# Patient Record
Sex: Female | Born: 1935 | Race: White | Hispanic: No | Smoking: Never smoker
Health system: Southern US, Community
[De-identification: ages and names within clinical notes are randomized; demographics above are authoritative.]

## PROBLEM LIST (undated history)

## (undated) DIAGNOSIS — J309 Allergic rhinitis, unspecified: Secondary | ICD-10-CM

## (undated) DIAGNOSIS — R198 Other specified symptoms and signs involving the digestive system and abdomen: Secondary | ICD-10-CM

## (undated) DIAGNOSIS — N390 Urinary tract infection, site not specified: Secondary | ICD-10-CM

## (undated) DIAGNOSIS — N183 Chronic kidney disease, stage 3 unspecified: Secondary | ICD-10-CM

## (undated) DIAGNOSIS — D649 Anemia, unspecified: Secondary | ICD-10-CM

## (undated) DIAGNOSIS — E611 Iron deficiency: Secondary | ICD-10-CM

## (undated) DIAGNOSIS — K56609 Unspecified intestinal obstruction, unspecified as to partial versus complete obstruction: Secondary | ICD-10-CM

## (undated) DIAGNOSIS — K5790 Diverticulosis of intestine, part unspecified, without perforation or abscess without bleeding: Secondary | ICD-10-CM

## (undated) DIAGNOSIS — R195 Other fecal abnormalities: Secondary | ICD-10-CM

## (undated) DIAGNOSIS — E860 Dehydration: Secondary | ICD-10-CM

## (undated) DIAGNOSIS — I10 Essential (primary) hypertension: Secondary | ICD-10-CM

## (undated) DIAGNOSIS — E559 Vitamin D deficiency, unspecified: Secondary | ICD-10-CM

## (undated) DIAGNOSIS — E538 Deficiency of other specified B group vitamins: Secondary | ICD-10-CM

## (undated) DIAGNOSIS — M7120 Synovial cyst of popliteal space [Baker], unspecified knee: Secondary | ICD-10-CM

## (undated) DIAGNOSIS — Z932 Ileostomy status: Secondary | ICD-10-CM

## (undated) DIAGNOSIS — G729 Myopathy, unspecified: Secondary | ICD-10-CM

## (undated) DIAGNOSIS — E782 Mixed hyperlipidemia: Secondary | ICD-10-CM

## (undated) DIAGNOSIS — M4180 Other forms of scoliosis, site unspecified: Secondary | ICD-10-CM

## (undated) DIAGNOSIS — E119 Type 2 diabetes mellitus without complications: Secondary | ICD-10-CM

## (undated) DIAGNOSIS — K5792 Diverticulitis of intestine, part unspecified, without perforation or abscess without bleeding: Secondary | ICD-10-CM

## (undated) DIAGNOSIS — M109 Gout, unspecified: Secondary | ICD-10-CM

## (undated) DIAGNOSIS — C679 Malignant neoplasm of bladder, unspecified: Secondary | ICD-10-CM

## (undated) DIAGNOSIS — G629 Polyneuropathy, unspecified: Secondary | ICD-10-CM

## (undated) DIAGNOSIS — M2550 Pain in unspecified joint: Secondary | ICD-10-CM

## (undated) DIAGNOSIS — B351 Tinea unguium: Secondary | ICD-10-CM

## (undated) HISTORY — DX: Iron deficiency: E61.1

## (undated) HISTORY — DX: Type 2 diabetes mellitus without complications: E11.9

## (undated) HISTORY — DX: Dehydration: E86.0

## (undated) HISTORY — DX: Deficiency of other specified B group vitamins: E53.8

## (undated) HISTORY — DX: Hypomagnesemia: E83.42

## (undated) HISTORY — DX: Other specified symptoms and signs involving the digestive system and abdomen: R19.8

## (undated) HISTORY — DX: Chronic kidney disease, stage 3 (moderate): N18.3

## (undated) HISTORY — DX: Tinea unguium: B35.1

## (undated) HISTORY — PX: CATARACT EXTRACTION, BILATERAL: SHX1313

## (undated) HISTORY — PX: BLADDER SUSPENSION: SHX72

## (undated) HISTORY — DX: Essential (primary) hypertension: I10

## (undated) HISTORY — DX: Allergic rhinitis, unspecified: J30.9

## (undated) HISTORY — DX: Ileostomy status: Z93.2

## (undated) HISTORY — DX: Other forms of scoliosis, site unspecified: M41.80

## (undated) HISTORY — DX: Urinary tract infection, site not specified: N39.0

## (undated) HISTORY — DX: Polyneuropathy, unspecified: G62.9

## (undated) HISTORY — DX: Vitamin D deficiency, unspecified: E55.9

## (undated) HISTORY — DX: Chronic kidney disease, stage 3 unspecified: N18.30

## (undated) HISTORY — DX: Unspecified intestinal obstruction, unspecified as to partial versus complete obstruction: K56.609

## (undated) HISTORY — DX: Diverticulosis of intestine, part unspecified, without perforation or abscess without bleeding: K57.90

## (undated) HISTORY — DX: Anemia, unspecified: D64.9

## (undated) HISTORY — DX: Other fecal abnormalities: R19.5

## (undated) HISTORY — DX: Gout, unspecified: M10.9

## (undated) HISTORY — DX: Malignant neoplasm of bladder, unspecified: C67.9

## (undated) HISTORY — DX: Hypocalcemia: E83.51

## (undated) HISTORY — PX: TRANSURETHRAL RESECTION OF BLADDER TUMOR: SHX2575

## (undated) HISTORY — DX: Myopathy, unspecified: G72.9

## (undated) HISTORY — DX: Diverticulitis of intestine, part unspecified, without perforation or abscess without bleeding: K57.92

## (undated) HISTORY — DX: Synovial cyst of popliteal space (Baker), unspecified knee: M71.20

## (undated) HISTORY — DX: Mixed hyperlipidemia: E78.2

## (undated) HISTORY — DX: Pain in unspecified joint: M25.50

---

## 1964-03-09 HISTORY — PX: OTHER SURGICAL HISTORY: SHX169

## 1976-03-09 HISTORY — PX: CHOLECYSTECTOMY: SHX55

## 1996-03-09 HISTORY — PX: MASTECTOMY: SHX3

## 2009-03-09 HISTORY — PX: MASTECTOMY: SHX3

## 2015-03-10 HISTORY — PX: ILEOSTOMY: SHX1783

## 2017-01-07 LAB — HM DIABETES EYE EXAM

## 2017-03-09 HISTORY — PX: KYPHOPLASTY: SHX5884

## 2017-09-23 LAB — BASIC METABOLIC PANEL
BUN: 49 — AB (ref 4–21)
Creatinine: 2.4 — AB (ref 0.5–1.1)
Glucose: 147
SODIUM: 138 (ref 137–147)

## 2017-09-23 LAB — HEPATIC FUNCTION PANEL
ALT: 10 (ref 7–35)
AST: 12 — AB (ref 13–35)
Alkaline Phosphatase: 87 (ref 25–125)
BILIRUBIN, TOTAL: 0.5

## 2017-09-23 LAB — CBC AND DIFFERENTIAL
HEMATOCRIT: 33 — AB (ref 36–46)
HEMOGLOBIN: 11 — AB (ref 12.0–16.0)
Platelets: 262 (ref 150–399)
WBC: 6.8

## 2017-12-13 ENCOUNTER — Ambulatory Visit (INDEPENDENT_AMBULATORY_CARE_PROVIDER_SITE_OTHER): Payer: Medicare Other | Admitting: Internal Medicine

## 2017-12-13 ENCOUNTER — Encounter: Payer: Self-pay | Admitting: Internal Medicine

## 2017-12-13 VITALS — BP 110/60 | HR 100 | Temp 97.6°F | Ht 58.5 in | Wt 116.0 lb

## 2017-12-13 DIAGNOSIS — N1832 Chronic kidney disease, stage 3b: Secondary | ICD-10-CM | POA: Insufficient documentation

## 2017-12-13 DIAGNOSIS — E1122 Type 2 diabetes mellitus with diabetic chronic kidney disease: Secondary | ICD-10-CM | POA: Diagnosis not present

## 2017-12-13 DIAGNOSIS — Z932 Ileostomy status: Secondary | ICD-10-CM | POA: Insufficient documentation

## 2017-12-13 DIAGNOSIS — D508 Other iron deficiency anemias: Secondary | ICD-10-CM

## 2017-12-13 DIAGNOSIS — K5790 Diverticulosis of intestine, part unspecified, without perforation or abscess without bleeding: Secondary | ICD-10-CM

## 2017-12-13 DIAGNOSIS — M810 Age-related osteoporosis without current pathological fracture: Secondary | ICD-10-CM | POA: Insufficient documentation

## 2017-12-13 DIAGNOSIS — N183 Chronic kidney disease, stage 3 unspecified: Secondary | ICD-10-CM

## 2017-12-13 DIAGNOSIS — Z23 Encounter for immunization: Secondary | ICD-10-CM | POA: Diagnosis not present

## 2017-12-13 DIAGNOSIS — R195 Other fecal abnormalities: Secondary | ICD-10-CM | POA: Diagnosis not present

## 2017-12-13 DIAGNOSIS — R000 Tachycardia, unspecified: Secondary | ICD-10-CM | POA: Insufficient documentation

## 2017-12-13 DIAGNOSIS — R600 Localized edema: Secondary | ICD-10-CM

## 2017-12-13 DIAGNOSIS — Z853 Personal history of malignant neoplasm of breast: Secondary | ICD-10-CM | POA: Insufficient documentation

## 2017-12-13 DIAGNOSIS — D649 Anemia, unspecified: Secondary | ICD-10-CM | POA: Insufficient documentation

## 2017-12-13 DIAGNOSIS — Z87440 Personal history of urinary (tract) infections: Secondary | ICD-10-CM | POA: Insufficient documentation

## 2017-12-13 DIAGNOSIS — H6121 Impacted cerumen, right ear: Secondary | ICD-10-CM | POA: Diagnosis not present

## 2017-12-13 DIAGNOSIS — H6123 Impacted cerumen, bilateral: Secondary | ICD-10-CM | POA: Insufficient documentation

## 2017-12-13 DIAGNOSIS — Z8551 Personal history of malignant neoplasm of bladder: Secondary | ICD-10-CM | POA: Insufficient documentation

## 2017-12-13 MED ORDER — DIPHENOXYLATE-ATROPINE 2.5-0.025 MG PO TABS: 1.0000 | ORAL_TABLET | Freq: Three times a day (TID) | ORAL | 0 refills | Status: DC | PRN

## 2017-12-13 MED ORDER — MAGNESIUM OXIDE 400 MG PO TABS: 400.0000 mg | ORAL_TABLET | Freq: Every day | ORAL | 1 refills | Status: DC

## 2017-12-13 MED ORDER — ALENDRONATE SODIUM 70 MG PO TABS: 70.0000 mg | ORAL_TABLET | ORAL | 5 refills | Status: DC

## 2017-12-13 MED ORDER — PANTOPRAZOLE SODIUM 40 MG PO TBEC: 40.0000 mg | DELAYED_RELEASE_TABLET | Freq: Every day | ORAL | 1 refills | Status: DC

## 2017-12-13 MED ORDER — SODIUM BICARBONATE 650 MG PO TABS: 650.0000 mg | ORAL_TABLET | Freq: Two times a day (BID) | ORAL | 1 refills | Status: DC

## 2017-12-13 MED ORDER — LOPERAMIDE HCL 2 MG PO CAPS: 2.0000 mg | ORAL_CAPSULE | Freq: Two times a day (BID) | ORAL | 1 refills | Status: DC

## 2017-12-13 MED ORDER — VITAMIN C 500 MG PO TABS: 500.0000 mg | ORAL_TABLET | Freq: Every day | ORAL | 1 refills | Status: DC

## 2017-12-13 MED ORDER — PIOGLITAZONE HCL 15 MG PO TABS: 15.0000 mg | ORAL_TABLET | Freq: Every day | ORAL | 5 refills | Status: DC

## 2017-12-13 MED ORDER — DIPHENOXYLATE-ATROPINE 2.5-0.025 MG PO TABS: 1.0000 | ORAL_TABLET | Freq: Three times a day (TID) | ORAL | 1 refills | Status: DC | PRN

## 2017-12-13 MED ORDER — ZOLPIDEM TARTRATE 5 MG PO TABS: 5.0000 mg | ORAL_TABLET | Freq: Every evening | ORAL | 0 refills | Status: DC | PRN

## 2017-12-13 MED ORDER — VITAMIN D3 50 MCG (2000 UT) PO CAPS: 2000.0000 [IU] | ORAL_CAPSULE | Freq: Every day | ORAL | 5 refills | Status: DC

## 2017-12-13 MED ORDER — TRAMADOL HCL 50 MG PO TABS: 50.0000 mg | ORAL_TABLET | Freq: Four times a day (QID) | ORAL | 0 refills | Status: DC | PRN

## 2017-12-13 NOTE — Progress Notes (Signed)
Provider:  Rexene Edison. Mariea Clonts, D.O., C.M.D. Location:   [place] of Service:   clinic  Previous PCP: System, Pcp Not In Patient Care Team: System, Pcp Not In as PCP - [facility] in [place] Extended Emergency Contact Information Primary Emergency Contact: Swier, Jude Mobile Phone: [phone] Relation: Son  Code Status: DNR Goals of Care: Advanced Directive information Advanced Directives 12/13/2017  Does Patient Have a Medical Advance Directive? Yes  Type of Paramedic of [place];Living will;Out of facility DNR (pink MOST or yellow form)  Does patient want to make changes to medical advance directive? No - Patient declined  Copy of [place] in Chart? No - copy requested   Chief Complaint  Patient presents with  . Establish Care    new patient, discuss lab results from [facility]    HPI: Patient is a 84 y.o. female seen today to establish with [facility].  Records have been requested from   Feet were swollen, she had pains in her legs that she never has. She'd been walking with PT with [facility] before that and had not been walking for a long time.   Ankles remain swollen.  She came out of rehab with a cold. That's better.    Gets hoarse every now and again.  No findings except except allergies.  She gets dehydrated and hoarse.  She does not drink water like she should.   Likes watery fruits and mlik also.    She fell w/o warning and broke her pelvis in this past year.  Had left ramus first time and right this time.  Back to walking, but now with cane.  Prior to that she did not use any assistive devices.  With her last hospitalization, she had kyphoplasty for a compression fx.  Pain went away immediately afterwards. Had pain for 6-12 mos that she did not tell anyone about. Had severe indigestion afterwards and came off iron and D.   Has always had "pre-anemia" and used to take iron since she had  her children.    She had been living at home alone before and now lives with her son.  Had been doing her own cooking and cleaning.  Did have help with her yard.  Still as driving also.    HR up today.  BP ok.  She was on metoprolol for tachy during the hospitalizations.  Dr. Loni Muse took her off b/c of low bp.  BP stays 117/72.  No dizziness or lightheadedness.  She has no heart history.    DMII:  Actos.  Had lantus at the hospital. Does not take any at home.  None in 6 mos.  CBGs:  103 this morning.  She checks her own sugars.  Has h/o diverticulosis with perforation.  Wound up with ileostomy in 2017.  She recovered well from the emergency surgery.    Hysterectomy was partial.  Breast cancer 1998, had mastectomy.  Three nodes were positive so she had chemo Then recurred in opposite breast and had mastectomy. Bladder cancer history  Some hearing loss since her fall.    No difficulty seeing.  Has yearly eye exams.  Had cataract surgery bilaterally.  Bowels had been normal with lomotil once a day and imodium twice a day.  After rehab, she's had watery diarrhea.  Had small cold, but has continued with looser stools through ileostomy.  lomotil bid and imodium once and metamucil when needs bulking. Dislikes it.  It makes her bloated.  It affects her appetite.    Not having much pain. Once in a while, the back pain. No pelvic pain.    She has no symptoms of UTI. May get a little short of breath and fatigued.  Back starts hurting/flank region. They are wanting her tested every 3 mos.    Had reclast back between [age] yo.    Got flu shot today.  Past Medical History:  Diagnosis Date  . Allergic rhinitis    Per Records from [facility]   . Anemia    Per Records from [place]   . Arthralgia of multiple sites    Per Records from [place]   . B12 deficiency    Per Records from [place]   . Baker's cyst    Per Records from [place]   . Bladder cancer ([place])   . CKD (chronic  kidney disease) stage 3, GFR 30-59 ml/min (HCC)    Per Records from [place]   . Dehydration    Per Records from [place]   . Dextroscoliosis    Per Records from [place]   . Diabetes ([place])   . Diverticulitis    Per Records from [place]   . Diverticulosis   . Fecal occult blood test positive    Per Records from [place]   . Gout    left foot  . 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 Hypertension    Per Records from [place]   . Hypocalcemia    Per Records from [place]   . Hypomagnesemia    Per Records from [place]   . Iron deficiency    Per Records from [place]   . Large bowel obstruction [facility])    Per Records from [place]   . Mixed dyslipidemia    Per Records from [place]   . Myopathy    Per Records from [place]   . Onychomycosis    Per Records from [place]   . Peripheral neuropathy    Per Records from [place]   . Recurrent UTI   . Vitamin D deficiency    Per Records from [place]    Past Surgical History:  Procedure Laterality Date  . BLADDER SUSPENSION     Per Records from [place]   . CATARACT EXTRACTION, BILATERAL    . CHOLECYSTECTOMY  1978  . hysterectomy  1966  . ILEOSTOMY  2017  . KYPHOPLASTY  2019  . MASTECTOMY Right 1998  . MASTECTOMY Left 2011  . TRANSURETHRAL RESECTION OF BLADDER TUMOR      Social History   Socioeconomic History  . Marital status: Widowed    Spouse name: Not on file  . Number of children: Not on file  . Years of education: Not on file  . Highest education level: Not on file  Occupational History  . Not on file  Social Needs  . Financial resource strain: Not on file  . Food insecurity:    Worry: Not on file    Inability: Not on file  . Transportation needs:    Medical: Not on file    Non-medical: Not on file  Tobacco Use  . Smoking status: Never Smoker  . Smokeless tobacco: Never Used  Substance and Sexual Activity  .  Alcohol use: Never    Frequency: Never  . Drug use: Never  . Sexual activity: Not Currently  Lifestyle  . Physical activity:    Days per week: Not on file    Minutes  per session: Not on file  . Stress: Not on file  Relationships  . Social connections:    Talks on phone: Not on file    Gets together: Not on file    Attends religious service: Not on file    Active member of club or organization: Not on file    Attends meetings of clubs or organizations: Not on file    Relationship status: Not on file  Other Topics Concern  . Not on file  Social History Narrative   Social History      Diet? regular      Do you drink/eat things with caffeine? 0      Marital status?          widow                          What year were you married? 1947      Do you live in a house, apartment, assisted living, condo, trailer, etc.? House--now lives in guest house of son, Jude      Is it one or more stories? yes      How many persons live in your home? 1 (self)      Do you have any pets in your home? (please list) 0      Highest level of education completed? 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       NO                               Type & how often?      Advanced Directives      Do you have a living will? yes      Do you have a DNR form?                   no               If not, do you want to discuss one? yes      Do you have signed POA/HPOA for forms? yes      Functional Status      Do you have difficulty bathing or dressing yourself? Yes- only due to repeat falls      Do you have difficulty preparing food or eating? no      Do you have difficulty managing your medications? no      Do you have difficulty managing your finances? no      Do you have difficulty affording your medications? no    reports that she has never smoked. She has never used smokeless tobacco. She reports that she does not drink alcohol or use drugs.  Functional  Status Survey:  INDEPENDENT except uses cane now that she often leaves behind  Family History  Problem Relation Age of Onset  . Transient ischemic attack Mother 26  . Heart attack Father 64  . Pancreatic cancer Sister 26  . Epilepsy Brother 75  . Diabetes Brother 109  . Dementia Sister 98  . Diabetes Brother   . Stroke Brother 28  . Alcoholism Brother 65  . Cancer - Other Son 52       liver cancer; [place]  . Heart attack Son 106  . Diabetes Son   . Anuerysm Daughter 69  brain    Health Maintenance  Topic Date Due  . HEMOGLOBIN A1C  02[birth year]/1926  . FOOT EXAM  03/04/1936  . OPHTHALMOLOGY EXAM  03/04/1936  . URINE MICROALBUMIN  03/04/1936  . TETANUS/TDAP  03/04/1945  . DEXA SCAN  03/05/1991  . PNA vac Low Risk Adult (1 of 2 - PCV13) 03/05/1991  . INFLUENZA VACCINE  10/07/2017  need up to date vaccine records from prior doctor Dr. Truett Mainland  Allergies  Allergen Reactions  . Codeine Nausea Only  . Flexeril [Cyclobenzaprine]     Per Records from [place]     Outpatient Encounter Medications as of 12/13/2017  Medication Sig  . diphenoxylate-atropine (LOMOTIL) 2.5-0.025 MG tablet Take 1 tablet by mouth 3 (three) times daily as needed.  . furosemide (LASIX) 20 MG tablet Take 20 mg by mouth as needed (fluid).  Marland Kitchen loperamide (IMODIUM) 2 MG capsule Take 1 capsule (2 mg total) by mouth 2 (two) times daily.  . magnesium oxide (MAG-OX) 400 MG tablet Take 1 tablet (400 mg total) by mouth daily.  . ondansetron (ZOFRAN-ODT) 4 MG disintegrating tablet Take 4 mg by mouth every 8 (eight) hours as needed for nausea or vomiting.  . pantoprazole (PROTONIX) 40 MG tablet Take 1 tablet (40 mg total) by mouth daily.  . pioglitazone (ACTOS) 15 MG tablet Take 1 tablet (15 mg total) by mouth daily.  . sodium bicarbonate 650 MG tablet Take 1 tablet (650 mg total) by mouth 2 (two) times daily.  . traMADol (ULTRAM) 50 MG tablet Take 1 tablet (50 mg total) by mouth every 6 (six) hours as needed.    . vitamin C (ASCORBIC ACID) 500 MG tablet Take 1 tablet (500 mg total) by mouth daily.  Marland Kitchen zolpidem (AMBIEN) 5 MG tablet Take 1 tablet (5 mg total) by mouth at bedtime as needed for sleep.  . [DISCONTINUED] aspirin EC 81 MG tablet Take 81 mg by mouth daily.  . [DISCONTINUED] diphenoxylate-atropine (LOMOTIL) 2.5-0.025 MG tablet Take 1 tablet by mouth 3 (three) times daily as needed.  . [DISCONTINUED] diphenoxylate-atropine (LOMOTIL) 2.5-0.025 MG tablet Take 1 tablet by mouth 3 (three) times daily as needed.  . [DISCONTINUED] insulin glargine (LANTUS) 100 UNIT/ML injection Inject into the skin daily.  . [DISCONTINUED] loperamide (IMODIUM) 2 MG capsule Take 2 mg by mouth 2 (two) times daily.  . [DISCONTINUED] magnesium oxide (MAG-OX) 400 MG tablet Take 400 mg by mouth daily.  . [DISCONTINUED] pantoprazole (PROTONIX) 40 MG tablet Take 40 mg by mouth daily.  . [DISCONTINUED] pioglitazone (ACTOS) 15 MG tablet Take 15 mg by mouth daily.  . [DISCONTINUED] sodium bicarbonate 650 MG tablet Take 650 mg by mouth 2 (two) times daily.  . [DISCONTINUED] traMADol (ULTRAM) 50 MG tablet Take 50 mg by mouth every 6 (six) hours as needed.  . [DISCONTINUED] vitamin C (ASCORBIC ACID) 500 MG tablet Take 500 mg by mouth daily.  . [DISCONTINUED] zolpidem (AMBIEN) 5 MG tablet Take 5 mg by mouth at bedtime as needed for sleep.  Marland Kitchen alendronate (FOSAMAX) 70 MG tablet Take 1 tablet (70 mg total) by mouth once a week. Take with a full glass of water on an empty stomach.  . Cholecalciferol (VITAMIN D3) 2000 units capsule Take 1 capsule (2,000 Units total) by mouth daily.   No facility-administered encounter medications on file as of 12/13/2017.     Review of Systems  Constitutional: Positive for weight loss. Negative for chills, diaphoresis, fever and malaise/fatigue.       Has lost 12-15 lbs  since last pelvic fx and rehab stay  HENT: Positive for hearing loss. Negative for congestion and sore throat.        Right ear since  second fall with pelvic fx; gets some hoarseness   Eyes: Negative for blurred vision.  Respiratory: Negative for cough, sputum production, shortness of breath and wheezing.   Cardiovascular: Positive for leg swelling. Negative for chest pain, palpitations, orthopnea and PND.       Tachycardia off metoprolol (bp had been too low); ankles also more swollen in past few days after walking more with PT and being off metoprolol  Gastrointestinal: Positive for diarrhea. Negative for abdominal pain, blood in stool, constipation and melena.       Had diverticulitis and required ileostomy after peritonitis; gets loose bms since then; appetite poor while using metamucil--feels bloated all the time and not hungry  Genitourinary: Negative for dysuria, frequency and urgency.  Musculoskeletal: Positive for back pain, falls and myalgias.  Skin: Negative for itching and rash.  Neurological: Negative for dizziness, tingling, tremors, sensory change, speech change, loss of consciousness and weakness.  Endo/Heme/Allergies: Positive for environmental allergies. Does not bruise/bleed easily.  Psychiatric/Behavioral: Negative for depression and memory loss. The patient has insomnia. The patient is not nervous/anxious.     Vitals:   12/13/17 1322  BP: 110/60  Pulse: 100  Temp: 97.6 F (36.4 C)  TempSrc: Oral  SpO2: 96%  Weight: 116 lb (52.6 kg)  Height: 4' 10.5" (1.486 m)   Body mass index is 23.83 kg/m. Physical Exam  Constitutional: She is oriented to person, place, and time.  Thin female, no acute distress  HENT:  Head: Normocephalic and atraumatic.  Right Ear: External ear normal.  Left Ear: External ear normal.  Nose: Nose normal.  Mouth/Throat: Oropharynx is clear and moist.  Right auditory canal full of dark, hard cerumen; mild hoarseness  Eyes: Pupils are equal, round, and reactive to light. Conjunctivae and EOM are normal.  Neck: Normal range of motion. Neck supple. No JVD present. No  tracheal deviation present. No thyromegaly present.  Cardiovascular: Normal rate, regular rhythm, normal heart sounds and intact distal pulses.  Rate improved by time of exam  Pulmonary/Chest: Effort normal and breath sounds normal. No respiratory distress. She has no wheezes. She has no rales.  Abdominal: Soft. Bowel sounds are normal.  Ileostomy with pink tissue, soft yellow-brown stool that's lumpy appearing coming out  Musculoskeletal: Normal range of motion.  Ambulates with cane when she remembers to use it, no tenderness or pain with ROM of hips or knees  Neurological: She is alert and oriented to person, place, and time. She displays normal reflexes. No cranial nerve deficit. Coordination normal.  Skin: Skin is warm and dry. Capillary refill takes less than 2 seconds.  Psychiatric: She has a normal mood and affect. Her behavior is normal. Judgment and thought content normal.    Labs reviewed: Basic Metabolic Panel: Recent Labs    09/23/17  NA 138  BUN 49*  CREATININE 2.4*   Liver Function Tests: Recent Labs    09/23/17  AST 12*  ALT 10  ALKPHOS 87   No results for input(s): LIPASE, AMYLASE in the last 8760 hours. No results for input(s): AMMONIA in the last 8760 hours. CBC: Recent Labs    09/23/17  WBC 6.8  HGB 11.0*  HCT 33*  PLT 262   Assessment/Plan 1. Type 2 diabetes mellitus with stage 3 chronic kidney disease, without long-term current use of insulin (HCC) -  well controlled, not needing insulin, oddly was on lantus prn sliding scale with her actos - COMPLETE METABOLIC PANEL WITH GFR - Hemoglobin A1c - pioglitazone (ACTOS) 15 MG tablet; Take 1 tablet (15 mg total) by mouth daily.  Dispense: 30 tablet; Refill: 5 -? Any role of actos with her swelling  2. Hearing loss due to cerumen impaction, right -right ear was flushed with warm water and peroxide solution and large piece of dark cerumen removed, hearing improved  3. Loose stools -chronic since  ileostomy -worse lately so will up lomotil temporarily to tid for a couple of weeks, then go back to bid, then daily if possible, maintain fiber and hydration in diet -try stopping metamucil and upping the lomotil to prevent bloating and full feeling so she can drink and eat more  4. Ileostomy status ([place]) -due to diverticulitis surgery and concern for malignancy at that time which turned out to be negative  5. Diverticulosis -s/p bowel resection and ileostomy, cont high fiber diet  6. Senile osteoporosis - pt with h/o two pelvic fxs and a compression fx s/p kyphoplasty -get back on vitamin D3 daily and fosamax weekly  - Cholecalciferol (VITAMIN D3) 2000 units capsule; Take 1 capsule (2,000 Units total) by mouth daily.  Dispense: 30 capsule; Refill: 5 - alendronate (FOSAMAX) 70 MG tablet; Take 1 tablet (70 mg total) by mouth once a week. Take with a full glass of water on an empty stomach.  Dispense: 4 tablet; Refill: 5  7. Chronic kidney disease, stage 3, mod decreased GFR (HCC) -Avoid nephrotoxic agents like nsaids, dose adjust renally excreted meds, hydrate. - COMPLETE METABOLIC PANEL WITH GFR  8. Other iron deficiency anemia - was on iron at one time, but got stopped when she was struggling to eat enough and taking a lot of other pills-reheck labs: - CBC with Differential/Platelet  9. Localized edema - suspect due to either increased walking with therapy or tachycardia from coming off metoprolol (due to hypotension which was significant)--may need to use alternative med for HR control or from actos (odd though b/c old drug for her)--son reports she had a normal echo not long ago - COMPLETE METABOLIC PANEL WITH GFR  Tachycardia:  When off metoprolol due to low bp -son will monitor pt's bp and HR at home and call me if not getting down below 90 for pulse in which case I may try a different rate control med  10. Need for influenza vaccination - Flu vaccine HIGH DOSE PF (Fluzone High  dose)  11. History of recurrent UTIs -son, Jude, a Pharmacist, community, reports pt has odd symptoms of back pain, fatigue and sob when these are coming on; does not get mcgeer criteria symptoms -hoping she will hydrate better if she does not feel so bloated from her metamucil  12. History of breast cancer -x2 with mastectomies at two different times and radiation after the first surgery -now 84yo  13. History of bladder cancer -need more details about this from Jude -need copy of urine culture and abx used (copy was poor) at home from [facility], Dr. Elyse Hsu also had copy and Jude was asking him to get it sent over  Labs/tests ordered:   Orders Placed This Encounter  Procedures  . Flu vaccine HIGH DOSE PF (Fluzone High dose)  . CBC with Differential/Platelet  . COMPLETE METABOLIC PANEL WITH GFR  . Hemoglobin A1c   F/u 2 mos   Deaysia Grigoryan L. Janeva Peaster, D.O. [facility] [address], [place] [address] Cell Phone (Mon-Fri 8am-5pm):  [phone] On Call:  [phone] & follow prompts after 5pm & weekends Office Phone:  [phone] Office Fax:  [phone]

## 2017-12-14 LAB — COMPLETE METABOLIC PANEL WITH GFR
AG Ratio: 1.5 (calc) (ref 1.0–2.5)
ALT: 8 U/L (ref 6–29)
AST: 14 U/L (ref 10–35)
Albumin: 4 g/dL (ref 3.6–5.1)
Alkaline phosphatase (APISO): 79 U/L (ref 33–130)
BUN/Creatinine Ratio: 15 (calc) (ref 6–22)
BUN: 27 mg/dL — ABNORMAL HIGH (ref 7–25)
CO2: 26 mmol/L (ref 20–32)
Calcium: 9.2 mg/dL (ref 8.6–10.4)
Chloride: 103 mmol/L (ref 98–110)
Creat: 1.86 mg/dL — ABNORMAL HIGH (ref 0.60–0.88)
GFR, Est African American: 27 mL/min/{1.73_m2} — ABNORMAL LOW (ref >=60)
GFR, Est Non African American: 23 mL/min/{1.73_m2} — ABNORMAL LOW (ref >=60)
Globulin: 2.6 g/dL (calc) (ref 1.9–3.7)
Glucose, Bld: 113 mg/dL (ref 65–139)
Potassium: 4.9 mmol/L (ref 3.5–5.3)
Sodium: 135 mmol/L (ref 135–146)
Total Bilirubin: 0.3 mg/dL (ref 0.2–1.2)
Total Protein: 6.6 g/dL (ref 6.1–8.1)

## 2017-12-14 LAB — CBC WITH DIFFERENTIAL/PLATELET
Basophils Absolute: 62 cells/uL (ref 0–200)
Basophils Relative: 1.2 %
Eosinophils Absolute: 31 cells/uL (ref 15–500)
Eosinophils Relative: 0.6 %
HCT: 29.6 % — ABNORMAL LOW (ref 35.0–45.0)
Hemoglobin: 9.7 g/dL — ABNORMAL LOW (ref 11.7–15.5)
Lymphs Abs: 2090 cells/uL (ref 850–3900)
MCH: 32.8 pg (ref 27.0–33.0)
MCHC: 32.8 g/dL (ref 32.0–36.0)
MCV: 100 fL (ref 80.0–100.0)
MPV: 9.5 fL (ref 7.5–12.5)
Monocytes Relative: 8.8 %
Neutro Abs: 2558 cells/uL (ref 1500–7800)
Neutrophils Relative %: 49.2 %
Platelets: 241 10*3/uL (ref 140–400)
RBC: 2.96 10*6/uL — ABNORMAL LOW (ref 3.80–5.10)
RDW: 12.7 % (ref 11.0–15.0)
Total Lymphocyte: 40.2 %
WBC mixed population: 458 cells/uL (ref 200–950)
WBC: 5.2 10*3/uL (ref 3.8–10.8)

## 2017-12-14 LAB — HEMOGLOBIN A1C
Hgb A1c MFr Bld: 6.3 % of total Hgb — ABNORMAL HIGH (ref <5.7)
Mean Plasma Glucose: 134 (calc)
eAG (mmol/L): 7.4 (calc)

## 2017-12-15 ENCOUNTER — Telehealth: Payer: Self-pay | Admitting: *Deleted

## 2017-12-15 DIAGNOSIS — G8929 Other chronic pain: Secondary | ICD-10-CM

## 2017-12-15 DIAGNOSIS — M545 Low back pain, unspecified: Secondary | ICD-10-CM

## 2017-12-15 DIAGNOSIS — F5101 Primary insomnia: Secondary | ICD-10-CM

## 2017-12-15 MED ORDER — TRAMADOL HCL 50 MG PO TABS: 50.0000 mg | ORAL_TABLET | Freq: Four times a day (QID) | ORAL | 0 refills | Status: DC | PRN

## 2017-12-15 MED ORDER — ZOLPIDEM TARTRATE 5 MG PO TABS: 5.0000 mg | ORAL_TABLET | Freq: Every evening | ORAL | 0 refills | Status: DC | PRN

## 2017-12-15 NOTE — Telephone Encounter (Signed)
Latoya with [facility] called and stated that patient has opt to go with their pharmacy due to different packaging and needs Tramadol and Zolpidem Rx's sent to them. Stated patient was seen on 10/7.  Pended Rx's and sent to Dr. Mariea Clonts to approve.

## 2017-12-15 NOTE — Telephone Encounter (Signed)
Rx's signed.

## 2017-12-20 ENCOUNTER — Encounter: Payer: Self-pay | Admitting: Internal Medicine

## 2017-12-20 ENCOUNTER — Other Ambulatory Visit: Payer: Self-pay | Admitting: *Deleted

## 2017-12-20 MED ORDER — ZOSTER VAC RECOMB ADJUVANTED 50 MCG/0.5ML IM SUSR: 0.5000 mL | Freq: Once | INTRAMUSCULAR | 1 refills | Status: AC

## 2017-12-23 ENCOUNTER — Telehealth: Payer: Self-pay | Admitting: *Deleted

## 2017-12-23 ENCOUNTER — Other Ambulatory Visit: Payer: Self-pay | Admitting: Internal Medicine

## 2017-12-23 DIAGNOSIS — D508 Other iron deficiency anemias: Secondary | ICD-10-CM

## 2017-12-23 MED ORDER — POLYSACCHARIDE IRON COMPLEX 150 MG PO CAPS: 150.0000 mg | ORAL_CAPSULE | Freq: Every day | ORAL | 3 refills | Status: DC

## 2017-12-23 NOTE — Telephone Encounter (Signed)
I apologize for the delay. I recommend the nu-iron be taken separately from the protonix.  protonix should be 30 mins before her largest meal.  Iron should be with a different meal.

## 2017-12-23 NOTE — Telephone Encounter (Signed)
Dr. Lenore Cordia called and left message on Clinical intake stating the message was for [facility]. Stated that he sent a MyChart message on 12/20/2017 and has not heard anything back from [facility] or Dr. Mariea Clonts. Stated that Yes he was on board with the iron supplement , whatever Dr. Mariea Clonts thinks is best that patient should use is fine with him. And he wonders if patient should take the iron at a different time than the Protonix due to it can affect the uptake of the iron but he is good with whatever you think is best.   Dr. Lenore Cordia would like for Cheryl Salas to call him with a response at [phone]

## 2017-12-24 NOTE — Telephone Encounter (Signed)
.  left message to have patient's son return my call. Also sent message thru [place]

## 2017-12-28 ENCOUNTER — Other Ambulatory Visit: Payer: Self-pay | Admitting: *Deleted

## 2017-12-28 DIAGNOSIS — D508 Other iron deficiency anemias: Secondary | ICD-10-CM

## 2017-12-28 MED ORDER — POLYSACCHARIDE IRON COMPLEX 150 MG PO CAPS: 150.0000 mg | ORAL_CAPSULE | Freq: Every day | ORAL | 3 refills | Status: DC

## 2017-12-28 NOTE — Telephone Encounter (Signed)
Son called and stated that [facility] needs a Rx for patient's iron. Stated that they will only put it in a blister pak if she has a rx. Faxed to pharmacy.

## 2018-01-13 ENCOUNTER — Other Ambulatory Visit: Payer: Self-pay | Admitting: Internal Medicine

## 2018-01-13 DIAGNOSIS — F5101 Primary insomnia: Secondary | ICD-10-CM

## 2018-01-13 MED ORDER — PANTOPRAZOLE SODIUM 40 MG PO TBEC: 40.0000 mg | DELAYED_RELEASE_TABLET | Freq: Every day | ORAL | 1 refills | Status: DC

## 2018-01-13 NOTE — Telephone Encounter (Signed)
[facility] Requesting refills [place] Verified LR: 12/15/2017 Pended and sent to Dr. Mariea Clonts for approval.

## 2018-01-18 ENCOUNTER — Telehealth: Payer: Self-pay | Admitting: *Deleted

## 2018-01-18 NOTE — Telephone Encounter (Signed)
Dr. Audrie Lia, son called and stated that patient has a Nurse Visit from [place] [facility] [phone] on Friday and he is requesting a U/A and Culture to be done. Stated that his mother is showing signs of a UTI. Stated she never has the traditional signs but she has slight confusion, SOB and Nausea. He stated that when she has these symptoms she comes back positive for a UTI. Would like an order sent to [facility] so they can collect a urine on Friday. Please Advise.

## 2018-01-18 NOTE — Telephone Encounter (Signed)
Recommend that he encourage her to drink more fluids (at least 8 8oz glasses of water) and cranberry juice until the urine sample can be obtained.  Ok to get UA c+s done by [facility] due to nausea and history of UTIs.

## 2018-01-19 ENCOUNTER — Encounter: Payer: Self-pay | Admitting: Internal Medicine

## 2018-01-19 NOTE — Telephone Encounter (Signed)
Called and spoke with [place] with [facility] and gave her verbal orders to give to patient's nurse Scott to obtain Urine on Friday.   Called and left message on Dr. Jasmine Pang voicemail regarding Dr. Cyndi Lennert Advise and confirm that verbal order was given to [facility].

## 2018-01-21 NOTE — Telephone Encounter (Signed)
UA results came back as mildly positive but urine culture was negative. Specimen was re incubated and that result was also negative. I spoke with patient's son, Jude, and he verbalized understanding. Per verbal conversation with Dr. Mariea Clonts, she recommended that patient continue drinking cranberry juice instead of taking cranberry capsules. Jude verbalized understanding.

## 2018-01-24 ENCOUNTER — Ambulatory Visit: Payer: Self-pay | Admitting: Internal Medicine

## 2018-02-09 ENCOUNTER — Other Ambulatory Visit: Payer: Self-pay | Admitting: *Deleted

## 2018-02-09 ENCOUNTER — Encounter: Payer: Self-pay | Admitting: Internal Medicine

## 2018-02-09 MED ORDER — PSYLLIUM 58.6 % PO PACK: 1.0000 | PACK | Freq: Every day | ORAL | 3 refills | Status: DC

## 2018-02-09 MED ORDER — DIPHENOXYLATE-ATROPINE 2.5-0.025 MG PO TABS: ORAL_TABLET | ORAL | 3 refills | Status: DC

## 2018-02-09 MED ORDER — LOPERAMIDE HCL 2 MG PO CAPS: ORAL_CAPSULE | ORAL | 3 refills | Status: DC

## 2018-02-09 NOTE — Telephone Encounter (Signed)
Per mychart message medications sent to the pharmacy

## 2018-02-10 ENCOUNTER — Other Ambulatory Visit: Payer: Self-pay | Admitting: Internal Medicine

## 2018-02-10 DIAGNOSIS — F5101 Primary insomnia: Secondary | ICD-10-CM

## 2018-02-10 NOTE — Telephone Encounter (Signed)
Medication was filled by Dr. Elyse Hsu

## 2018-02-18 ENCOUNTER — Ambulatory Visit: Payer: Self-pay

## 2018-02-22 ENCOUNTER — Encounter: Payer: Self-pay | Admitting: *Deleted

## 2018-03-07 ENCOUNTER — Ambulatory Visit
Admission: RE | Admit: 2018-03-07 | Discharge: 2018-03-07 | Disposition: A | Discharge status: Home / Self Care | Payer: Medicare Other | Source: Ambulatory Visit | Attending: Internal Medicine | Admitting: Internal Medicine

## 2018-03-07 ENCOUNTER — Encounter: Payer: Self-pay | Admitting: Internal Medicine

## 2018-03-07 ENCOUNTER — Ambulatory Visit (INDEPENDENT_AMBULATORY_CARE_PROVIDER_SITE_OTHER): Payer: Medicare Other | Admitting: Internal Medicine

## 2018-03-07 ENCOUNTER — Other Ambulatory Visit: Payer: Self-pay | Admitting: Internal Medicine

## 2018-03-07 VITALS — BP 128/62 | HR 66 | Temp 98.0°F | Ht 59.0 in | Wt 115.0 lb

## 2018-03-07 DIAGNOSIS — D500 Iron deficiency anemia secondary to blood loss (chronic): Secondary | ICD-10-CM

## 2018-03-07 DIAGNOSIS — R195 Other fecal abnormalities: Secondary | ICD-10-CM | POA: Diagnosis not present

## 2018-03-07 DIAGNOSIS — N183 Chronic kidney disease, stage 3 unspecified: Secondary | ICD-10-CM

## 2018-03-07 DIAGNOSIS — E1122 Type 2 diabetes mellitus with diabetic chronic kidney disease: Secondary | ICD-10-CM

## 2018-03-07 DIAGNOSIS — D508 Other iron deficiency anemias: Secondary | ICD-10-CM

## 2018-03-07 LAB — CBC WITH DIFFERENTIAL/PLATELET
Absolute Monocytes: 371 cells/uL (ref 200–950)
Basophils Absolute: 52 cells/uL (ref 0–200)
Basophils Relative: 0.9 %
Eosinophils Absolute: 17 cells/uL (ref 15–500)
Eosinophils Relative: 0.3 %
HCT: 32.4 % — ABNORMAL LOW (ref 35.0–45.0)
Hemoglobin: 10.5 g/dL — ABNORMAL LOW (ref 11.7–15.5)
Lymphs Abs: 1601 cells/uL (ref 850–3900)
MCH: 32 pg (ref 27.0–33.0)
MCHC: 32.4 g/dL (ref 32.0–36.0)
MCV: 98.8 fL (ref 80.0–100.0)
MPV: 9.9 fL (ref 7.5–12.5)
Monocytes Relative: 6.4 %
Neutro Abs: 3758 cells/uL (ref 1500–7800)
Neutrophils Relative %: 64.8 %
Platelets: 225 10*3/uL (ref 140–400)
RBC: 3.28 10*6/uL — ABNORMAL LOW (ref 3.80–5.10)
RDW: 12.9 % (ref 11.0–15.0)
Total Lymphocyte: 27.6 %
WBC: 5.8 10*3/uL (ref 3.8–10.8)

## 2018-03-07 LAB — BASIC METABOLIC PANEL
BUN/Creatinine Ratio: 15 (calc) (ref 6–22)
BUN: 25 mg/dL (ref 7–25)
CO2: 23 mmol/L (ref 20–32)
Calcium: 9.2 mg/dL (ref 8.6–10.4)
Chloride: 110 mmol/L (ref 98–110)
Creat: 1.64 mg/dL — ABNORMAL HIGH (ref 0.60–0.88)
Glucose, Bld: 127 mg/dL (ref 65–139)
Potassium: 5.3 mmol/L (ref 3.5–5.3)
Sodium: 139 mmol/L (ref 135–146)

## 2018-03-07 NOTE — Progress Notes (Signed)
Location:  [facility] clinic Provider:  Ladarrius Bogdanski L. Mariea Clonts, D.O., C.M.D.  Code Status:  Does not have DNR form but new patient packet indicated she wanted to discuss one--we need to have this discussion next visit  Goals of Care:  Advanced Directives 12/13/2017  Does Patient Have a Medical Advance Directive? Yes  Type of Paramedic of [place];Living will;Out of facility DNR (pink MOST or yellow form)  Does patient want to make changes to medical advance directive? No - Patient declined  Copy of [place] in Chart? No - copy requested     Chief Complaint  Patient presents with  . Medical Management of Chronic Issues    24mth follow-up    HPI: Patient is a 84 y.o. female seen today for medical management of chronic diseases.  She has a h/o DMII with CKD3, senile osteoporosis, diverticulosis with prior obstruction and ileostomy status, recurrent UTIs, prior breast cancer and prior bladder cancer, anemia.    Jude, her son, is with her today.  She continues to have difficulty with her bowels.  She's taking metamucil and imodium.  It's real watery w/o the metamucil.  Taking the metamucil once a day.  Dr. Ardis Hughs is going to call Jude about it later, he reports.  She's not had a blockage or dehydration and UTI since she's been under her son's direct care.  She's had no abdominal pain.  1.5 to 2 wks ago, she had a stomach virus and hurt by her ribs.  48 hrs later, she was better after 2 episodes of vomiting.  Was back to eating solid foods w/in a day.  He's concerned she may have an obstruction that led to all of this though she seems clinically improved.  She's no longer on lasix or protonix.  No issues whatsoever w/ weaning protonix.  No need for insulin for sugar.  HR has been great.    Pt actually was born August 26, 2023.  Rx coverage shows [birth year] so we have this down as her birthday to ensure her meds get filled properly though she's really 84 yo now.      Fatigue seems better back on iron, but not back to prefall.  Is walking and has gone out shopping with her son a couple of times.  No dancing "yet".    She's been eating and drinking well.  Is down just one lb.  BMs are dark greeny-brown.  Consistency is different.  Has been on iron since last lab results had shown return of anemia.  Discussed that iron can cause either constipation or diarrhea depending on the patient.    Past Medical History:  Diagnosis Date  . Allergic rhinitis    Per Records from [place]   . Anemia    Per Records from [place]   . Arthralgia of multiple sites    Per Records from [place]   . B12 deficiency    Per Records from [place]   . Baker's cyst    Per Records from [place]   . Bladder cancer ([place])   . CKD (chronic kidney disease) stage 3, GFR 30-59 ml/min (HCC)    Per Records from [place]   . Dehydration    Per Records from [place]   . Dextroscoliosis    Per Records from [place]   . Diabetes ([place])   . Diverticulitis    Per Records from [place]   . Diverticulosis   . Fecal occult blood test positive    Per Records from  Dr.Altheimer   . Gout    left foot  . 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[place])    Per Records from [place]   . Hypertension    Per Records from [place]   . Hypocalcemia    Per Records from [place]   . Hypomagnesemia    Per Records from [place]   . Iron deficiency    Per Records from [place]   . Large bowel obstruction [facility])    Per Records from [place]   . Mixed dyslipidemia    Per Records from [place]   . Myopathy    Per Records from [place]   . Onychomycosis    Per Records from [place]   . Peripheral neuropathy    Per Records from [place]   . Recurrent UTI   . Vitamin D deficiency    Per Records from [place]     Past Surgical History:  Procedure Laterality Date  . BLADDER SUSPENSION     Per Records from [place]   . CATARACT  EXTRACTION, BILATERAL    . CHOLECYSTECTOMY  1978  . hysterectomy  1966  . ILEOSTOMY  2017  . KYPHOPLASTY  2019  . MASTECTOMY Right 1998  . MASTECTOMY Left 2011  . TRANSURETHRAL RESECTION OF BLADDER TUMOR      Allergies  Allergen Reactions  . Codeine Nausea Only  . Flexeril [Cyclobenzaprine]     Per Records from [place]     Outpatient Encounter Medications as of 03/07/2018  Medication Sig  . alendronate (FOSAMAX) 70 MG tablet Take 1 tablet (70 mg total) by mouth once a week. Take with a full glass of water on an empty stomach.  . Cholecalciferol (VITAMIN D3) 2000 units capsule Take 1 capsule (2,000 Units total) by mouth daily.  . diphenoxylate-atropine (LOMOTIL) 2.5-0.025 MG tablet Take 2 tablets in the morning, 1 tablet at lunch and 1 tablet at dinner  . iron polysaccharides (NIFEREX) 150 MG capsule Take 1 capsule (150 mg total) by mouth daily.  Marland Kitchen loperamide (IMODIUM) 2 MG capsule Take 2 tablets in the morning, 1 tablet at lunch and 1 tablet at dinner  . magnesium oxide (MAG-OX) 400 MG tablet Take 1 tablet (400 mg total) by mouth daily.  . ondansetron (ZOFRAN-ODT) 4 MG disintegrating tablet Take 4 mg by mouth every 8 (eight) hours as needed for nausea or vomiting.  . pioglitazone (ACTOS) 15 MG tablet Take 1 tablet (15 mg total) by mouth daily.  . psyllium (METAMUCIL) 58.6 % packet Take 1 packet by mouth at bedtime.  . sodium bicarbonate 650 MG tablet Take 1 tablet (650 mg total) by mouth 2 (two) times daily.  . traMADol (ULTRAM) 50 MG tablet Take 1 tablet (50 mg total) by mouth every 6 (six) hours as needed.  . vitamin C (ASCORBIC ACID) 500 MG tablet Take 1 tablet (500 mg total) by mouth daily.  Marland Kitchen zolpidem (AMBIEN) 5 MG tablet Take 1 tablet (5 mg total) by mouth at bedtime as needed for sleep.  . [DISCONTINUED] furosemide (LASIX) 20 MG tablet Take 20 mg by mouth as needed (fluid).  . [DISCONTINUED] pantoprazole (PROTONIX) 40 MG tablet Take 1 tablet (40 mg total) by mouth daily.     No facility-administered encounter medications on file as of 03/07/2018.     Review of Systems:  Review of Systems  Constitutional: Positive for weight loss. Negative for chills, fever and malaise/fatigue.  Respiratory: Negative for cough and shortness of breath.   Cardiovascular: Negative for chest pain, palpitations and leg swelling.  Gastrointestinal: Positive for constipation and diarrhea. Negative for abdominal pain, blood in stool, heartburn, melena, nausea and vomiting.       See hpi for details of GI   Genitourinary: Negative for dysuria.  Musculoskeletal: Negative for back pain, falls and joint pain.  Skin: Negative for itching and rash.  Neurological: Negative for dizziness and loss of consciousness.  Endo/Heme/Allergies: Bruises/bleeds easily.  Psychiatric/Behavioral: Positive for memory loss. Negative for depression. The patient is not nervous/anxious and does not have insomnia.     Health Maintenance  Topic Date Due  . FOOT EXAM  03/04/1935  . OPHTHALMOLOGY EXAM  03/04/1935  . URINE MICROALBUMIN  [birth year]04/1937  . TETANUS/TDAP  03/04/1946  . DEXA SCAN  03/04/1992  . PNA vac Low Risk Adult (2 of 2 - PPSV23) 03/09/2016  . HEMOGLOBIN A1C  06/14/2018  . INFLUENZA VACCINE  Completed    Physical Exam: Vitals:   03/07/18 1117  BP: 128/62  Pulse: 66  Temp: 98 F (36.7 C)  TempSrc: Oral  SpO2: 97%  Weight: 115 lb (52.2 kg)  Height: 4\' 11"  (1.499 m)   Body mass index is 23.23 kg/m. Physical Exam Vitals signs reviewed.  Constitutional:      General: She is not in acute distress.    Appearance: Normal appearance. She is normal weight. She is not ill-appearing.  HENT:     Head: Normocephalic and atraumatic.  Cardiovascular:     Rate and Rhythm: Normal rate and regular rhythm.     Pulses: Normal pulses.  Pulmonary:     Effort: Pulmonary effort is normal.     Breath sounds: Normal breath sounds. No wheezing, rhonchi or rales.  Abdominal:     General: Bowel  sounds are normal. There is no distension.     Palpations: Abdomen is soft. There is no mass.     Tenderness: There is no abdominal tenderness. There is no guarding or rebound.     Comments: Ileostomy with watery dark brown pieces of stool mixed in it; pink tissue visible; no tenderness of her abdomen; normal bowel sounds  Musculoskeletal: Normal range of motion.     Comments: Much more agile and getting around easier this visit, able to climb up onto regular exam table w/o assistance of myself or her son  Skin:    General: Skin is warm and dry.  Neurological:     General: No focal deficit present.     Mental Status: She is alert and oriented to person, place, and time.     Comments: Has some short term memory loss and her son fills in the details  Psychiatric:        Mood and Affect: Mood normal.        Behavior: Behavior normal.     Labs reviewed: Basic Metabolic Panel: Recent Labs    09/23/17 12/13/17 1525  NA 138 135  K  --  4.9  CL  --  103  CO2  --  26  GLUCOSE  --  113  BUN 49* 27*  CREATININE 2.4* 1.86*  CALCIUM  --  9.2   Liver Function Tests: Recent Labs    09/23/17 12/13/17 1525  AST 12* 14  ALT 10 8  ALKPHOS 87  --   BILITOT  --  0.3  PROT  --  6.6   No results for input(s): LIPASE, AMYLASE in the last 8760 hours. No results for input(s): AMMONIA in the last 8760 hours. CBC: Recent Labs    09/23/17  12/13/17 1525  WBC 6.8 5.2  NEUTROABS  --  2,558  HGB 11.0* 9.7*  HCT 33* 29.6*  MCV  --  100.0  PLT 262 241   Lipid Panel: No results for input(s): CHOL, HDL, LDLCALC, TRIG, CHOLHDL, LDLDIRECT in the last 8760 hours. Lab Results  Component Value Date   HGBA1C 6.3 (H) 12/13/2017    Procedures since last visit: No results found.  Assessment/Plan 1. Iron deficiency anemia due to chronic blood loss - f/u labs, cont iron for now - CBC with Differential/Platelet - Basic metabolic panel - DG Abd 1 View; Future  2. Type 2 diabetes mellitus with  stage 3 chronic kidney disease, without long-term current use of insulin (HCC) - well controlled w/o meds Lab Results  Component Value Date   HGBA1C 6.3 (H) 12/13/2017   - Basic metabolic panel  3. Chronic kidney disease, stage 3, mod decreased GFR (HCC) -f/u lab - Basic metabolic panel -Avoid nephrotoxic agents like nsaids, dose adjust renally excreted meds, hydrate.  4. Loose stools - suspect role of iron supplement -cont to monitor weight -not a candidate for cscope to evaluate at this point -may be IBS  -cont fiber supplement and lomotil, imodium use as directed by GI - Basic metabolic panel - DG Abd 1 View; Future --KUB to r/o obstruction or ileus, but doubt with her bowel sounds and excellent clinical appearance  Labs/tests ordered:   Orders Placed This Encounter  Procedures  . DG Abd 1 View    Standing Status:   Future    Number of Occurrences:   1    Standing Expiration Date:   05/07/2019    Order Specific Question:   Reason for Exam (SYMPTOM  OR DIAGNOSIS REQUIRED)    Answer:   loose bm, vomiting, h/o obstruction    Order Specific Question:   Preferred imaging location?    Answer:   [facility]  . CBC with Differential/Platelet  . Basic metabolic panel    Order Specific Question:   Has the patient fasted?    Answer:   Yes  . HM DIABETES EYE EXAM    This external order was created through the Results Console.    Next appt:  3 mos med mgt  Deaken Jurgens L. Justin Buechner, D.O. [facility] [address], [place] [address] Cell Phone (Mon-Fri 8am-5pm):  [phone] On Call:  [phone] & follow prompts after 5pm & weekends Office Phone:  [phone] Office Fax:  [phone]

## 2018-03-10 ENCOUNTER — Other Ambulatory Visit: Payer: Self-pay | Admitting: Internal Medicine

## 2018-03-10 DIAGNOSIS — F5101 Primary insomnia: Secondary | ICD-10-CM

## 2018-04-04 ENCOUNTER — Other Ambulatory Visit: Payer: Self-pay | Admitting: Internal Medicine

## 2018-04-04 DIAGNOSIS — F5101 Primary insomnia: Secondary | ICD-10-CM

## 2018-04-11 ENCOUNTER — Other Ambulatory Visit: Payer: Self-pay | Admitting: Internal Medicine

## 2018-04-11 ENCOUNTER — Other Ambulatory Visit: Payer: Self-pay

## 2018-04-11 DIAGNOSIS — F5101 Primary insomnia: Secondary | ICD-10-CM

## 2018-04-11 NOTE — Telephone Encounter (Signed)
Pharmacy called stating that patient was out of Zolpidem and requesting refill. Patient's caregiver is upset because medication was not included in patient medication package.   Checked data base last filled on 03/10/2018 for 20 tabs of zolpidem. Next appointment 05/16/2018 with Dr. Mariea Clonts. Last appointment was 02/24/2018

## 2018-04-11 NOTE — Telephone Encounter (Signed)
Was phoned in already so did not need to be sent to me.  thanks.

## 2018-05-03 ENCOUNTER — Other Ambulatory Visit: Payer: Self-pay | Admitting: Internal Medicine

## 2018-05-03 DIAGNOSIS — M810 Age-related osteoporosis without current pathological fracture: Secondary | ICD-10-CM

## 2018-05-03 DIAGNOSIS — N183 Chronic kidney disease, stage 3 unspecified: Secondary | ICD-10-CM

## 2018-05-03 DIAGNOSIS — F5101 Primary insomnia: Secondary | ICD-10-CM

## 2018-05-03 DIAGNOSIS — E1122 Type 2 diabetes mellitus with diabetic chronic kidney disease: Secondary | ICD-10-CM

## 2018-05-03 NOTE — Telephone Encounter (Signed)
[place] Database verified and compliance confirmed   Last filled 04/04/2018

## 2018-05-05 ENCOUNTER — Telehealth: Payer: Self-pay | Admitting: *Deleted

## 2018-05-05 NOTE — Telephone Encounter (Signed)
Addendum to last note: Son stated that he was not going to take patient to Urgent Care to be evaluated.

## 2018-05-05 NOTE — Telephone Encounter (Signed)
Patient son, Dr. Lenore Cordia called and left message on Clinical Intake stating that patient fell last night. No Swelling or bruising but patient complains of groin pain and is flat on back in the bed. Patient has history of Pelvic fractures. Wants your advice. No available appointments today or tomorrow. Please Advise.

## 2018-05-05 NOTE — Telephone Encounter (Signed)
Patient son called back regarding message. Stated that patient is in pain. Wonders if your going to order X-Ray. No Available appointment.  Please Advise.

## 2018-05-06 ENCOUNTER — Ambulatory Visit (INDEPENDENT_AMBULATORY_CARE_PROVIDER_SITE_OTHER): Payer: Medicare Other | Admitting: Nurse Practitioner

## 2018-05-06 ENCOUNTER — Other Ambulatory Visit: Payer: Self-pay | Admitting: Nurse Practitioner

## 2018-05-06 ENCOUNTER — Encounter: Payer: Self-pay | Admitting: Nurse Practitioner

## 2018-05-06 ENCOUNTER — Ambulatory Visit
Admission: RE | Admit: 2018-05-06 | Discharge: 2018-05-06 | Disposition: A | Discharge status: Home / Self Care | Payer: Medicare Other | Source: Ambulatory Visit | Attending: Nurse Practitioner | Admitting: Nurse Practitioner

## 2018-05-06 VITALS — BP 112/60 | HR 82 | Temp 98.1°F | Ht 59.0 in | Wt 116.0 lb

## 2018-05-06 DIAGNOSIS — R1032 Left lower quadrant pain: Secondary | ICD-10-CM

## 2018-05-06 DIAGNOSIS — G8929 Other chronic pain: Secondary | ICD-10-CM | POA: Diagnosis not present

## 2018-05-06 DIAGNOSIS — M545 Low back pain, unspecified: Secondary | ICD-10-CM

## 2018-05-06 DIAGNOSIS — W19XXXA Unspecified fall, initial encounter: Secondary | ICD-10-CM

## 2018-05-06 MED ORDER — ONDANSETRON 4 MG PO TBDP: 4.0000 mg | ORAL_TABLET | Freq: Three times a day (TID) | ORAL | 0 refills | Status: DC | PRN

## 2018-05-06 MED ORDER — TRAMADOL HCL 50 MG PO TABS: 50.0000 mg | ORAL_TABLET | Freq: Four times a day (QID) | ORAL | 0 refills | Status: DC | PRN

## 2018-05-06 NOTE — Patient Instructions (Addendum)
To go to [facility] for xrays  Continue tramadol with tylenol for pain  Home health PT has been ordered

## 2018-05-06 NOTE — Progress Notes (Signed)
Careteam: Patient Care Team: Gayland Curry, DO as PCP - General (Geriatric Medicine)  Advanced Directive information    Allergies  Allergen Reactions  . Codeine Nausea Only  . Flexeril [Cyclobenzaprine]     Per Records from [place]     Chief Complaint  Patient presents with  . Acute Visit    Follow-up from fall on 05/04/2018, patient was going to the kitchen and fell. High Fall Risk, will need to follow-up with Fall Risk flowsheet  . Medication Refill    Tramadol and Zofran. [place] database confirmed for Tramdol, last filled 12/16/2017  . Sore    Sore on right leg x 6 weeks, no change in color or size. Sore is tender to touch   . FYI    Patient's DOB [birth year] yet her pharmacy benefits are under the DOB of 03/06/1925     HPI: Patient is a 85 y.o. female seen in the office today due to fall 2 days ago and now having increase pain.   Previous living in [place] and fell and had a fracture of pelvis there. Did rehab there, once she was released she was moved here in her sons guest house. States she was going to bed to kitchen and just fell. Did not trip, "just fell"  No rugs in hallways and home was evaluated by home health.  Could not get back up. Called her son and he came over to get her up.  Reports she was not in pain, was able to walk to the bathroom with walker.  Currently having increase in pain. She feels like it is muscular, son is requesting Xray due to hx.   Was not using walker prior to fall but now requiring this due to pain, otherwise she would lose her balance.  Pain is on standing only. Pain 8/10 when she puts her weight on leg the groin hurts.  No radiating in pain, numbness or tingling.  Pain currently 6/10.   Tramadol helps relieve pain (4/10 when she is sitting, 5/10 when standing) No side effects to tramadol. Also alternating with tylenol.   Gets zofran because when she is in pain she gets nauseated and they have no tablets at thist ime.    Review of  Systems:  Review of Systems  Constitutional: Negative for chills and fever.  Gastrointestinal: Negative for nausea and vomiting.  Musculoskeletal: Positive for falls, joint pain and myalgias.  Neurological: Negative for tingling and sensory change.    Past Medical History:  Diagnosis Date  . Allergic rhinitis    Per Records from [place]   . Anemia    Per Records from [place]   . Arthralgia of multiple sites    Per Records from [place]   . B12 deficiency    Per Records from [place]   . Baker's cyst    Per Records from [place]   . Bladder cancer ([place])   . CKD (chronic kidney disease) stage 3, GFR 30-59 ml/min (HCC)    Per Records from [place]   . Dehydration    Per Records from [place]   . Dextroscoliosis    Per Records from [place]   . Diabetes ([place])   . Diverticulitis    Per Records from [place]   . Diverticulosis   . Fecal occult blood test positive    Per Records from [place]   . Gout    left foot  . 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[place])    Per Records from [place]   .  Hypertension    Per Records from [place]   . Hypocalcemia    Per Records from [place]   . Hypomagnesemia    Per Records from [place]   . Iron deficiency    Per Records from [place]   . Large bowel obstruction [facility])    Per Records from [place]   . Mixed dyslipidemia    Per Records from [place]   . Myopathy    Per Records from [place]   . Onychomycosis    Per Records from [place]   . Peripheral neuropathy    Per Records from [place]   . Recurrent UTI   . Vitamin D deficiency    Per Records from [place]    Past Surgical History:  Procedure Laterality Date  . BLADDER SUSPENSION     Per Records from [place]   . CATARACT EXTRACTION, BILATERAL    . CHOLECYSTECTOMY  1978  . hysterectomy  1966  . ILEOSTOMY  2017  . KYPHOPLASTY  2019  . MASTECTOMY Right 1998  . MASTECTOMY Left 2011  .  TRANSURETHRAL RESECTION OF BLADDER TUMOR     Social History:   reports that she has never smoked. She has never used smokeless tobacco. She reports that she does not drink alcohol or use drugs.  Family History  Problem Relation Age of Onset  . Transient ischemic attack Mother 35  . Heart attack Father 21  . Pancreatic cancer Sister 60  . Epilepsy Brother 90  . Diabetes Brother 37  . Dementia Sister 59  . Diabetes Brother   . Stroke Brother 53  . Alcoholism Brother 39  . Cancer - Other Son 2       liver cancer; [place]  . Heart attack Son 70  . Diabetes Son   . Anuerysm Daughter 28       brain    Medications: Patient's Medications  New Prescriptions   No medications on file  Previous Medications   ACETAMINOPHEN (TYLENOL) 325 MG TABLET    Take 650 mg by mouth as needed.   ALENDRONATE (FOSAMAX) 70 MG TABLET    TAKE 1 TABLET BY MOUTH ONCE A WEEK WITH A FULL GLASS OF WATER ON AN EMPTY STOMACH   D3 SUPER STRENGTH 50 MCG (2000 UT) CAPS    TAKE 1 CAPSULE BY MOUTH EVERY DAY   DIPHENOXYLATE-ATROPINE (LOMOTIL) 2.5-0.025 MG TABLET    TAKE 2 TABLETS BY MOUTH EVERY MORNING, 1 TABLET AT LUNCH, AND 1 TABLET AT DINNER   IRON POLYSACCHARIDES (NIFEREX) 150 MG CAPSULE    Take 1 capsule (150 mg total) by mouth daily.   LOPERAMIDE (IMODIUM) 2 MG CAPSULE    TAKE 2 CAPSULES BY MOUTH EVERY MORNING, 1 capsule AT LUNCH, AND 1 CAPSULE AT DINNER   MAGNESIUM OXIDE 400 (240 MG) MG TABS    TAKE 1 TABLET BY MOUTH EVERY DAY   ONDANSETRON (ZOFRAN-ODT) 4 MG DISINTEGRATING TABLET    Take 4 mg by mouth every 8 (eight) hours as needed for nausea or vomiting.   PIOGLITAZONE (ACTOS) 15 MG TABLET    TAKE 1 TABLET BY MOUTH EVERY DAY   PSYLLIUM (METAMUCIL) 58.6 % PACKET    Take 1 packet by mouth at bedtime.   SIMETHICONE (MYLICON) 416 MG CHEWABLE TABLET    Chew 125 mg by mouth as needed for flatulence. GasX   SODIUM BICARBONATE 650 MG TABLET    TAKE 1 TABLET BY MOUTH 2 TIMES DAILY   TRAMADOL (ULTRAM) 50 MG TABLET  Take 1 tablet (50 mg total) by mouth every 6 (six) hours as needed.   VITAMIN C (ASCORBIC ACID) 500 MG TABLET    TAKE 1 TABLET BY MOUTH EVERY DAY   ZOLPIDEM (AMBIEN) 5 MG TABLET    TAKE 1 TABLET BY MOUTH NIGHTLY AT BEDTIME AS NEEDED SLEEP  Modified Medications   No medications on file  Discontinued Medications   MAGNESIUM OXIDE (MAG-OX) 400 MG TABLET    Take 1 tablet (400 mg total) by mouth daily.     Physical Exam:  Vitals:   05/06/18 1124  BP: 112/60  Pulse: 82  Temp: 98.1 F (36.7 C)  TempSrc: Oral  SpO2: 97%  Weight: 116 lb (52.6 kg)  Height: 4\' 11"  (1.499 m)   Body mass index is 23.43 kg/m.  Physical Exam Constitutional:      Appearance: Normal appearance.  Cardiovascular:     Rate and Rhythm: Normal rate and regular rhythm.     Pulses: Normal pulses.  Pulmonary:     Effort: Pulmonary effort is normal.     Breath sounds: Normal breath sounds.  Musculoskeletal:     Left hip: She exhibits decreased range of motion, decreased strength and tenderness. She exhibits no bony tenderness, no swelling and no deformity.       Legs:     Comments: Increased tenderness   Neurological:     General: No focal deficit present.     Mental Status: She is alert and oriented to person, place, and time.  Psychiatric:        Mood and Affect: Mood normal.     Labs reviewed: Basic Metabolic Panel: Recent Labs    09/23/17 12/13/17 1525 03/07/18 1205  NA 138 135 139  K  --  4.9 5.3  CL  --  103 110  CO2  --  26 23  GLUCOSE  --  113 127  BUN 49* 27* 25  CREATININE 2.4* 1.86* 1.64*  CALCIUM  --  9.2 9.2   Liver Function Tests: Recent Labs    09/23/17 12/13/17 1525  AST 12* 14  ALT 10 8  ALKPHOS 87  --   BILITOT  --  0.3  PROT  --  6.6   No results for input(s): LIPASE, AMYLASE in the last 8760 hours. No results for input(s): AMMONIA in the last 8760 hours. CBC: Recent Labs    09/23/17 12/13/17 1525 03/07/18 1205  WBC 6.8 5.2 5.8  NEUTROABS  --  2,558 3,758    HGB 11.0* 9.7* 10.5*  HCT 33* 29.6* 32.4*  MCV  --  100.0 98.8  PLT 262 241 225   Lipid Panel: No results for input(s): CHOL, HDL, LDLCALC, TRIG, CHOLHDL, LDLDIRECT in the last 8760 hours. TSH: No results for input(s): TSH in the last 8760 hours. A1C: Lab Results  Component Value Date   HGBA1C 6.3 (H) 12/13/2017     Assessment/Plan 1. Fall, initial encounter 2. Left groin pain Continues to use walker/wheelchair for support - Ambulatory referral to [place]- eval and treat due to gait, pain and fall -wil get xrays to rule out fracture.  - DG HIP UNILAT WITH PELVIS 2-3 VIEWS LEFT; Future - DG FEMUR 1V LEFT; Future - ondansetron (ZOFRAN-ODT) 4 MG disintegrating tablet; Take 1 tablet (4 mg total) by mouth every 8 (eight) hours as needed for nausea or vomiting.  Dispense: 10 tablet; Refill: 0 - traMADol (ULTRAM) 50 MG tablet; Take 1 tablet (50 mg total) by mouth every 6 (six)  hours as needed for up to 7 days.  Dispense: 28 tablet; Refill: 0 3. Chronic midline low back pain without sciatica - traMADol (ULTRAM) 50 MG tablet; Take 1 tablet (50 mg total) by mouth every 6 (six) hours as needed for up to 7 days.  Dispense: 28 tablet; Refill: 0  Next appt: as scheduled w Dr Mariea Clonts.  Carlos American. [place], [facility] [phone]

## 2018-05-06 NOTE — Telephone Encounter (Signed)
Patient was seen in office by Janett Billow today. X-Ray's ordered.

## 2018-05-06 NOTE — Telephone Encounter (Signed)
Let's order bilateral hips and pelvis at [facility] imaging if  he is able to get her there.  If he cannot get her safely out of bed, I recommend he call 911 for EMS transport to the ED

## 2018-05-09 ENCOUNTER — Other Ambulatory Visit: Payer: Self-pay | Admitting: Nurse Practitioner

## 2018-05-09 ENCOUNTER — Telehealth: Payer: Self-pay | Admitting: Internal Medicine

## 2018-05-09 ENCOUNTER — Ambulatory Visit: Payer: Medicare Other | Admitting: Internal Medicine

## 2018-05-09 ENCOUNTER — Encounter: Payer: Self-pay | Admitting: Nurse Practitioner

## 2018-05-09 DIAGNOSIS — S32592A Other specified fracture of left pubis, initial encounter for closed fracture: Secondary | ICD-10-CM

## 2018-05-09 NOTE — Telephone Encounter (Signed)
Called pts son(Jude) to get [facility] they prefer to use for referral. Jude has put in message but also has asked me to drop a message regarding which orthopedic specialist you all would suggest Ms Dhruvi to see for her pelvic fracture?  Thanks, Vilinda Blanks.

## 2018-05-09 NOTE — Telephone Encounter (Signed)
I'd suggest Dr. Ninfa Linden or Dr. Alvan Dame.  Of note, I don't expect her to need surgery.  I just want them to clear her to be safe to do therapy.  They need to confirm that her pelvis is stable.

## 2018-05-09 NOTE — Telephone Encounter (Signed)
  On call 05/07/2018 received a call from Radiology- pt with L inf and sup pubic rami fractures; called family , no answer. Not urgent-stale fracture

## 2018-05-11 ENCOUNTER — Telehealth: Payer: Self-pay

## 2018-05-11 ENCOUNTER — Encounter: Payer: Self-pay | Admitting: Nurse Practitioner

## 2018-05-11 DIAGNOSIS — W19XXXA Unspecified fall, initial encounter: Secondary | ICD-10-CM

## 2018-05-11 DIAGNOSIS — E1122 Type 2 diabetes mellitus with diabetic chronic kidney disease: Secondary | ICD-10-CM

## 2018-05-11 DIAGNOSIS — N183 Chronic kidney disease, stage 3 (moderate): Secondary | ICD-10-CM

## 2018-05-11 DIAGNOSIS — I129 Hypertensive chronic kidney disease with stage 1 through stage 4 chronic kidney disease, or unspecified chronic kidney disease: Secondary | ICD-10-CM

## 2018-05-11 DIAGNOSIS — R1032 Left lower quadrant pain: Secondary | ICD-10-CM

## 2018-05-11 DIAGNOSIS — S32502D Unspecified fracture of left pubis, subsequent encounter for fracture with routine healing: Secondary | ICD-10-CM

## 2018-05-11 DIAGNOSIS — E1142 Type 2 diabetes mellitus with diabetic polyneuropathy: Secondary | ICD-10-CM

## 2018-05-11 NOTE — Telephone Encounter (Signed)
I called [facility] and left automated message requesting return call after a 8 min hold.  Reason for call: Ask if a bathing assistant can be added to home health orders per patient request (refer to mychart message dated 05/11/2018)   CB

## 2018-05-12 MED ORDER — UNABLE TO FIND: 0 refills | Status: DC

## 2018-05-12 NOTE — Telephone Encounter (Signed)
[place] [facility] [place] again, spoke with [facility]. Provider will need to fax an order to get additional services added.  Order faxed as requessted

## 2018-05-13 NOTE — Telephone Encounter (Signed)
Order was faxed yesterday to [facility] known as [place]

## 2018-05-16 ENCOUNTER — Telehealth: Payer: Self-pay | Admitting: *Deleted

## 2018-05-16 ENCOUNTER — Ambulatory Visit (INDEPENDENT_AMBULATORY_CARE_PROVIDER_SITE_OTHER): Payer: Medicare Other | Admitting: Internal Medicine

## 2018-05-16 ENCOUNTER — Encounter: Payer: Self-pay | Admitting: Internal Medicine

## 2018-05-16 VITALS — BP 128/68 | HR 73 | Temp 97.9°F | Ht 59.0 in | Wt 115.0 lb

## 2018-05-16 DIAGNOSIS — S32592A Other specified fracture of left pubis, initial encounter for closed fracture: Secondary | ICD-10-CM

## 2018-05-16 DIAGNOSIS — E1122 Type 2 diabetes mellitus with diabetic chronic kidney disease: Secondary | ICD-10-CM

## 2018-05-16 DIAGNOSIS — Z932 Ileostomy status: Secondary | ICD-10-CM

## 2018-05-16 DIAGNOSIS — H6122 Impacted cerumen, left ear: Secondary | ICD-10-CM

## 2018-05-16 DIAGNOSIS — N183 Chronic kidney disease, stage 3 unspecified: Secondary | ICD-10-CM

## 2018-05-16 DIAGNOSIS — D500 Iron deficiency anemia secondary to blood loss (chronic): Secondary | ICD-10-CM

## 2018-05-16 DIAGNOSIS — R21 Rash and other nonspecific skin eruption: Secondary | ICD-10-CM

## 2018-05-16 MED ORDER — DIPHENOXYLATE-ATROPINE 2.5-0.025 MG PO TABS: ORAL_TABLET | ORAL | 3 refills | Status: DC

## 2018-05-16 MED ORDER — LOPERAMIDE HCL 2 MG PO CAPS: ORAL_CAPSULE | ORAL | 3 refills | Status: DC

## 2018-05-16 NOTE — Telephone Encounter (Signed)
Amber with [facility] called requesting verbal orders for PT 2x2wks, 1x1wk for strengthing and also Home Health Aid  Verbal order given.

## 2018-05-16 NOTE — Patient Instructions (Signed)
Try some OTC hydrocortisone on your right shin skin area.  If it's a dermatitis, it should improve with that.  If it's fungal, it may get worse.  Let me know if it's not improving after a couple of weeks.

## 2018-05-16 NOTE — Progress Notes (Signed)
Location:  [facility] clinic Provider:  Prithvi Kooi L. Mariea Clonts, D.O., C.M.D.  Goals of Care:  Advanced Directives 12/13/2017  Does Patient Have a Medical Advance Directive? Yes  Type of Paramedic of [place];Living will;Out of facility DNR (pink MOST or yellow form)  Does patient want to make changes to medical advance directive? No - Patient declined  Copy of [place] in Chart? No - copy requested     Chief Complaint  Patient presents with  . Medical Management of Chronic Issues    2mth follow-up, right leg rash    HPI: Patient is a 85 y.o. female seen today for medical management of chronic diseases.    Rash on right leg.    Still hurts in left groin when walking and putting pressure on it.  She was walking through the kitchen and just fell--cane went one way and she went the other.  She is getting therapy starting this week.  Sees Dr. Lyla Glassing Friday.  Left ear is full of wax.    She has a hospital bed.  They are making sure she doesn't overdo it with her walking.  She is faithfully using her walker.  So far only doing chair therapy--just one session.  She's had help to bathe and it's covered from her insurance.    She only started on fosamax since seeing me.  Takes on sundays.    Metamucil daily is working fine.    She has been on an iron supplement.  Past Medical History:  Diagnosis Date  . Allergic rhinitis    Per Records from [place]   . Anemia    Per Records from [place]   . Arthralgia of multiple sites    Per Records from [place]   . B12 deficiency    Per Records from [place]   . Baker's cyst    Per Records from [place]   . Bladder cancer ([place])   . CKD (chronic kidney disease) stage 3, GFR 30-59 ml/min (HCC)    Per Records from [place]   . Dehydration    Per Records from [place]   . Dextroscoliosis    Per Records from [place]   . Diabetes ([place])   . Diverticulitis    Per Records  from [place]   . Diverticulosis   . Fecal occult blood test positive    Per Records from [place]   . Gout    left foot  . 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[place])    Per Records from [place]   . Hypertension    Per Records from [place]   . Hypocalcemia    Per Records from [place]   . Hypomagnesemia    Per Records from [place]   . Iron deficiency    Per Records from [place]   . Large bowel obstruction [facility])    Per Records from [place]   . Mixed dyslipidemia    Per Records from [place]   . Myopathy    Per Records from [place]   . Onychomycosis    Per Records from [place]   . Peripheral neuropathy    Per Records from [place]   . Recurrent UTI   . Vitamin D deficiency    Per Records from [place]     Past Surgical History:  Procedure Laterality Date  . BLADDER SUSPENSION     Per Records from [place]   . CATARACT EXTRACTION, BILATERAL    . CHOLECYSTECTOMY  1978  . hysterectomy  1966  . ILEOSTOMY  2017  . KYPHOPLASTY  2019  . MASTECTOMY Right 1998  . MASTECTOMY Left 2011  . TRANSURETHRAL RESECTION OF BLADDER TUMOR      Allergies  Allergen Reactions  . Codeine Nausea Only  . Flexeril [Cyclobenzaprine]     Per Records from [place]     Outpatient Encounter Medications as of 05/16/2018  Medication Sig  . acetaminophen (TYLENOL) 325 MG tablet Take 650 mg by mouth as needed.  Marland Kitchen alendronate (FOSAMAX) 70 MG tablet TAKE 1 TABLET BY MOUTH ONCE A WEEK WITH A FULL GLASS OF WATER ON AN EMPTY STOMACH  . D3 SUPER STRENGTH 50 MCG (2000 UT) CAPS TAKE 1 CAPSULE BY MOUTH EVERY DAY  . diphenoxylate-atropine (LOMOTIL) 2.5-0.025 MG tablet TAKE 2 TABLETS BY MOUTH EVERY MORNING, 1 TABLET AT LUNCH, AND 1 TABLET AT DINNER  . iron polysaccharides (NIFEREX) 150 MG capsule Take 1 capsule (150 mg total) by mouth daily.  Marland Kitchen loperamide (IMODIUM) 2 MG capsule TAKE 2 CAPSULES BY MOUTH EVERY MORNING, 1 capsule AT LUNCH, AND 1 CAPSULE AT  DINNER  . Magnesium Oxide 400 (240 Mg) MG TABS TAKE 1 TABLET BY MOUTH EVERY DAY  . ondansetron (ZOFRAN-ODT) 4 MG disintegrating tablet Take 1 tablet (4 mg total) by mouth every 8 (eight) hours as needed for nausea or vomiting.  . pioglitazone (ACTOS) 15 MG tablet TAKE 1 TABLET BY MOUTH EVERY DAY  . psyllium (METAMUCIL) 58.6 % packet Take 1 packet by mouth at bedtime.  . simethicone (MYLICON) 833 MG chewable tablet Chew 125 mg by mouth as needed for flatulence. GasX  . sodium bicarbonate 650 MG tablet TAKE 1 TABLET BY MOUTH 2 TIMES DAILY  . vitamin C (ASCORBIC ACID) 500 MG tablet TAKE 1 TABLET BY MOUTH EVERY DAY  . zolpidem (AMBIEN) 5 MG tablet TAKE 1 TABLET BY MOUTH NIGHTLY AT BEDTIME AS NEEDED SLEEP   No facility-administered encounter medications on file as of 05/16/2018.     Review of Systems:  Review of Systems  Constitutional: Negative for chills, fever and malaise/fatigue.  HENT: Negative for congestion.   Eyes: Negative for blurred vision.  Respiratory: Negative for cough and shortness of breath.   Cardiovascular: Negative for chest pain, palpitations and leg swelling.  Gastrointestinal: Negative for abdominal pain, blood in stool, constipation, diarrhea and melena.       Ostomy--uses metamucil and lomotil and imodium to creat a balance   Genitourinary: Negative for dysuria.  Musculoskeletal: Positive for falls and joint pain.       Left groin pain  Skin: Positive for rash. Negative for itching.       Tender place on left anterior superior shin  Neurological: Positive for weakness. Negative for dizziness and loss of consciousness.  Endo/Heme/Allergies: Does not bruise/bleed easily.  Psychiatric/Behavioral: Negative for depression. The patient is not nervous/anxious.     Health Maintenance  Topic Date Due  . FOOT EXAM  03/04/1935  . URINE MICROALBUMIN  [birth year]04/1937  . TETANUS/TDAP  03/04/1946  . DEXA SCAN  03/04/1992  . PNA vac Low Risk Adult (2 of 2 - PPSV23) 03/09/2016  .  OPHTHALMOLOGY EXAM  01/07/2018  . HEMOGLOBIN A1C  06/14/2018  . INFLUENZA VACCINE  Completed    Physical Exam: Vitals:   05/16/18 1515  BP: 128/68  Pulse: 73  Temp: 97.9 F (36.6 C)  TempSrc: Oral  SpO2: 97%  Weight: 115 lb (52.2 kg)  Height: 4\' 11"  (1.499 m)   Body mass index is 23.23 kg/m. Physical Exam Vitals signs reviewed.  Constitutional:      General: She is not in acute distress.    Appearance: She is not toxic-appearing.  HENT:     Head: Normocephalic.  Eyes:     Conjunctiva/sclera: Conjunctivae normal.     Pupils: Pupils are equal, round, and reactive to light.  Cardiovascular:     Rate and Rhythm: Normal rate and regular rhythm.  Pulmonary:     Effort: Pulmonary effort is normal.     Breath sounds: Normal breath sounds.  Abdominal:     General: Bowel sounds are normal.     Palpations: Abdomen is soft.     Tenderness: There is no guarding or rebound.     Comments: Ostomy in place  Musculoskeletal: Normal range of motion.     Comments: Left groin and medial thigh tenderness  Skin:    General: Skin is warm and dry.     Capillary Refill: Capillary refill takes less than 2 seconds.     Comments: About 1.2 cm round macule on right anterior superior shin without dry scale or excoriation, slightly tender and diveted when palpated (no prior injury there or biopsy)  Neurological:     General: No focal deficit present.     Mental Status: She is alert.     Comments: Using walker  Psychiatric:        Mood and Affect: Mood normal.     Labs reviewed: Basic Metabolic Panel: Recent Labs    09/23/17 12/13/17 1525 03/07/18 1205  NA 138 135 139  K  --  4.9 5.3  CL  --  103 110  CO2  --  26 23  GLUCOSE  --  113 127  BUN 49* 27* 25  CREATININE 2.4* 1.86* 1.64*  CALCIUM  --  9.2 9.2   Liver Function Tests: Recent Labs    09/23/17 12/13/17 1525  AST 12* 14  ALT 10 8  ALKPHOS 87  --   BILITOT  --  0.3  PROT  --  6.6   No results for input(s): LIPASE,  AMYLASE in the last 8760 hours. No results for input(s): AMMONIA in the last 8760 hours. CBC: Recent Labs    09/23/17 12/13/17 1525 03/07/18 1205  WBC 6.8 5.2 5.8  NEUTROABS  --  2,558 3,758  HGB 11.0* 9.7* 10.5*  HCT 33* 29.6* 32.4*  MCV  --  100.0 98.8  PLT 262 241 225   Lipid Panel: No results for input(s): CHOL, HDL, LDLCALC, TRIG, CHOLHDL, LDLDIRECT in the last 8760 hours. Lab Results  Component Value Date   HGBA1C 6.3 (H) 12/13/2017    Procedures since last visit: Dg Hip Unilat With Pelvis 2-3 Views Left  Result Date: 05/07/2018 CLINICAL DATA:  [place] 3 days prior / c/o LEFT hip and femur pain since / altered gait / concern for FX / jdh 315 EXAM: DG HIP (WITH OR WITHOUT PELVIS) 2-3V LEFT COMPARISON:  03/07/2018 FINDINGS: Diffuse osteopenia. No femoral neck fracture or hip dislocation. Fracture deformity of the superior left pubic ramus, new since previous. Probable fracture of the inferior left pubic ramus with acute cortical interruption noted on 1 projection. Lumbar dextroscoliosis with multilevel spondylitic change as before. Femoral arterial calcifications. IMPRESSION: 1. Left superior and inferior pubic ramus fractures Electronically Signed   By: Lucrezia Europe M.D.   On: 05/07/2018 10:09   Dg Femur Min 2 Views Left  Result Date: 05/07/2018 CLINICAL DATA:  [place] 3 days prior / c/o LEFT hip and femur pain since /  altered gait / concern for FX / jdh 315 EXAM: LEFT FEMUR 2 VIEWS COMPARISON:  03/07/2018 FINDINGS: Superior and inferior left pubic ramus fractures. No hip dislocation. Femur intact. Diffuse osteopenia. Femoral arterial calcifications. IMPRESSION: Superior and inferior left pubic ramus fractures. These results will be called to the ordering clinician or representative by the Radiologist Assistant, and communication documented in the PACS or zVision Dashboard. Electronically Signed   By: Lucrezia Europe M.D.   On: 05/07/2018 10:10    Assessment/Plan 1. Pubic ramus fracture,  left, closed, initial encounter ([place]) -was diagnosed with xrays done of left hip and pelvis on 2/28--fx of both superior and inferior pubic ramus on left which appeared new -cont walking with walker -has consultation with Dr. Lyla Glassing coming up to ensure safety for more intensive therapy (but appears fx nondisplaced and she should be safe) -pt taking it easy with just chair exercises and short distance walking with walker thus far -not using any strong pain meds and has not required this -we had just recently started fosamax tx and is on vitamin D3 also - Basic metabolic panel - Hemoglobin A1c  2. Ileostomy status ([place]) - cont metamucil daily and will decrease am dose of both lomotil and imodium so she's back to one po tid for both  - diphenoxylate-atropine (LOMOTIL) 2.5-0.025 MG tablet; TAKE  TABLETS BY MOUTH EVERY MORNING, 1 TABLET AT LUNCH, AND 1 TABLET AT DINNER  Dispense: 90 tablet; Refill: 3 - loperamide (IMODIUM) 2 MG capsule; TAKE 1 CAPSULE BY MOUTH EVERY MORNING, 1 capsule AT LUNCH, AND 1 CAPSULE AT DINNER  Dispense: 90 capsule; Refill: 3  3. Type 2 diabetes mellitus with stage 3 chronic kidney disease, without long-term current use of insulin (HCC) - f/u labs - Basic metabolic panel - Hemoglobin A1c  4. Skin macule or macular rash -will tx as dermatitis/eczema patch, but if not improved with otc steroid cream, or worsens, may need to try antifungal; if still not better, derm may need to check it to ensure not early squamous cell ca  5. Iron deficiency anemia due to chronic blood loss -f/u labs today due to nu-iron use - CBC with Differential/Platelet  6. Excessive cerumen in left ear canal -flushed with warm water and peroxide with improved hearing  Labs/tests ordered:   Orders Placed This Encounter  Procedures  . CBC with Differential/Platelet  . Basic metabolic panel    Order Specific Question:   Has the patient fasted?    Answer:   No  . Hemoglobin A1c    Next  appt:  05/16/2018   Zuria Fosdick L. Domonique Cothran, D.O. [facility] [address], [place] [address] Cell Phone (Mon-Fri 8am-5pm):  [phone] On Call:  [phone] & follow prompts after 5pm & weekends Office Phone:  [phone] Office Fax:  [phone]

## 2018-05-17 ENCOUNTER — Telehealth: Payer: Self-pay | Admitting: *Deleted

## 2018-05-17 ENCOUNTER — Encounter: Payer: Self-pay | Admitting: Internal Medicine

## 2018-05-17 DIAGNOSIS — E875 Hyperkalemia: Secondary | ICD-10-CM

## 2018-05-17 LAB — BASIC METABOLIC PANEL
BUN/Creatinine Ratio: 20 (calc) (ref 6–22)
BUN: 34 mg/dL — ABNORMAL HIGH (ref 7–25)
CO2: 23 mmol/L (ref 20–32)
Calcium: 9.4 mg/dL (ref 8.6–10.4)
Chloride: 105 mmol/L (ref 98–110)
Creat: 1.73 mg/dL — ABNORMAL HIGH (ref 0.60–0.88)
Glucose, Bld: 116 mg/dL (ref 65–139)
Potassium: 6.1 mmol/L — ABNORMAL HIGH (ref 3.5–5.3)
Sodium: 135 mmol/L (ref 135–146)

## 2018-05-17 LAB — CBC WITH DIFFERENTIAL/PLATELET
Absolute Monocytes: 427 cells/uL (ref 200–950)
Basophils Absolute: 59 cells/uL (ref 0–200)
Basophils Relative: 1.1 %
Eosinophils Absolute: 22 cells/uL (ref 15–500)
Eosinophils Relative: 0.4 %
HCT: 28.3 % — ABNORMAL LOW (ref 35.0–45.0)
Hemoglobin: 9.5 g/dL — ABNORMAL LOW (ref 11.7–15.5)
Lymphs Abs: 1976 cells/uL (ref 850–3900)
MCH: 33 pg (ref 27.0–33.0)
MCHC: 33.6 g/dL (ref 32.0–36.0)
MCV: 98.3 fL (ref 80.0–100.0)
MPV: 9.4 fL (ref 7.5–12.5)
Monocytes Relative: 7.9 %
Neutro Abs: 2916 cells/uL (ref 1500–7800)
Neutrophils Relative %: 54 %
Platelets: 294 10*3/uL (ref 140–400)
RBC: 2.88 10*6/uL — ABNORMAL LOW (ref 3.80–5.10)
RDW: 12.4 % (ref 11.0–15.0)
Total Lymphocyte: 36.6 %
WBC: 5.4 10*3/uL (ref 3.8–10.8)

## 2018-05-17 LAB — MAGNESIUM: Magnesium: 1.7 mg/dL (ref 1.5–2.5)

## 2018-05-17 LAB — HEMOGLOBIN A1C
Hgb A1c MFr Bld: 5.8 % of total Hgb — ABNORMAL HIGH (ref <5.7)
Mean Plasma Glucose: 120 (calc)
eAG (mmol/L): 6.6 (calc)

## 2018-05-17 LAB — TEST AUTHORIZATION

## 2018-05-17 NOTE — Telephone Encounter (Signed)
[name] with [facility] called to confirm the dosage of patient's Lomotil. Directions state: Take Tablets by mouth every morning, One tablet at lunch, and One tablet at [employer]  It is suppose to be Take One tablet in the morning, One tablet at lunch and One tablet at [employer] Correct?  Please Advise.

## 2018-05-17 NOTE — Telephone Encounter (Signed)
This is what her last prescription was:   TAKE 2 TABLETS BY MOUTH EVERY MORNING, 1 TABLET AT LUNCH, AND 1 TABLET AT DINNER  Dr. Mariea Clonts please confirm

## 2018-05-17 NOTE — Telephone Encounter (Signed)
Med list updated, spoke with [facility] and advised results

## 2018-05-17 NOTE — Telephone Encounter (Signed)
They are supposed to say one at breakfast, one at lunch and one at supper for both lomotil and imodium.  Apparently I neglected to erase the s in the free text.

## 2018-05-18 ENCOUNTER — Other Ambulatory Visit: Payer: Medicare Other

## 2018-05-18 ENCOUNTER — Other Ambulatory Visit: Payer: Self-pay

## 2018-05-18 DIAGNOSIS — S32599A Other specified fracture of unspecified pubis, initial encounter for closed fracture: Secondary | ICD-10-CM | POA: Insufficient documentation

## 2018-05-19 LAB — BASIC METABOLIC PANEL WITH GFR
BUN/Creatinine Ratio: 18 (calc) (ref 6–22)
BUN: 29 mg/dL — ABNORMAL HIGH (ref 7–25)
CO2: 21 mmol/L (ref 20–32)
Calcium: 9 mg/dL (ref 8.6–10.4)
Chloride: 109 mmol/L (ref 98–110)
Creat: 1.6 mg/dL — ABNORMAL HIGH (ref 0.60–0.88)
GFR, Est African American: 32 mL/min/{1.73_m2} — ABNORMAL LOW (ref >=60)
GFR, Est Non African American: 28 mL/min/{1.73_m2} — ABNORMAL LOW (ref >=60)
Glucose, Bld: 156 mg/dL — ABNORMAL HIGH (ref 65–139)
Potassium: 5.5 mmol/L — ABNORMAL HIGH (ref 3.5–5.3)
Sodium: 137 mmol/L (ref 135–146)

## 2018-05-26 ENCOUNTER — Other Ambulatory Visit: Payer: Self-pay | Admitting: Nurse Practitioner

## 2018-05-26 DIAGNOSIS — M545 Low back pain, unspecified: Secondary | ICD-10-CM

## 2018-05-26 DIAGNOSIS — G8929 Other chronic pain: Secondary | ICD-10-CM

## 2018-05-26 NOTE — Telephone Encounter (Signed)
Side Note- [facility] database and pharmacy have patients DOB listed as 03/05/1925. Patient's son confirmed at a previous appointment that patients actual DOB is 04/12/2023 as listed in our EMR system.  [facility] Database verified and compliance confirmed   Last filled 05/06/2018 - 7 day supply by Janett Billow K. Dewaine Oats, NP  Previously filled 12/16/17 by Gayland Curry, DO

## 2018-06-06 ENCOUNTER — Other Ambulatory Visit: Payer: Self-pay | Admitting: Internal Medicine

## 2018-06-06 ENCOUNTER — Telehealth: Payer: Self-pay

## 2018-06-06 DIAGNOSIS — F5101 Primary insomnia: Secondary | ICD-10-CM

## 2018-06-06 NOTE — Telephone Encounter (Signed)
Prior authorization started on Zolpidem tartrate 5mg  tablets. Cheryl Salas (Key: [id number])   Your information has been submitted to [place] [employer] Part [employer]. [employer] will review the request and will issue a decision, typically within 1-3 days from your submission. You can check the updated outcome later by reopening this request. If [employer] has not responded in 1-3 days or if you have any questions about your ePA request, please contact [place] [employer] Part D at [phone]. If you think there may be a problem with your PA request, use our live chat feature at the bottom right.

## 2018-06-06 NOTE — Telephone Encounter (Signed)
Patient son Dr. Lenore Cordia requested and [place], Juliann Pulse (pharmacist) requested, stated patient is out of medication. Phoned to pharmacy.

## 2018-06-14 NOTE — Telephone Encounter (Addendum)
Per cover my meds Denied on April 2  Your request has been denied DrugZolpidem Tartrate 5MG  tablets  [employer] Electronic PA Form  Original Claim [id number] PLAN LIMIT EXCEEDED-PRESCRIBER CALL [facility] 90 DAYS SUPPLY IN [place] SUPPLY REMAINING [id number] FILL DT [id number], LAST FILL [phone] @[facility]PH# [id number] .  [facility] the wrong year for patient date of birth.I spoke with Barnett Applebaum at [facility] and she stated patient son gave the patient's  information. [facility] states the medication was paid for with cash

## 2018-06-22 ENCOUNTER — Other Ambulatory Visit: Payer: Self-pay | Admitting: Internal Medicine

## 2018-06-22 DIAGNOSIS — D508 Other iron deficiency anemias: Secondary | ICD-10-CM

## 2018-06-22 DIAGNOSIS — F5101 Primary insomnia: Secondary | ICD-10-CM

## 2018-06-24 ENCOUNTER — Encounter: Payer: Self-pay | Admitting: Nurse Practitioner

## 2018-06-27 ENCOUNTER — Other Ambulatory Visit: Payer: Self-pay | Admitting: *Deleted

## 2018-06-27 ENCOUNTER — Other Ambulatory Visit: Payer: Self-pay | Admitting: Internal Medicine

## 2018-06-27 DIAGNOSIS — M545 Low back pain, unspecified: Secondary | ICD-10-CM

## 2018-06-27 DIAGNOSIS — G8929 Other chronic pain: Secondary | ICD-10-CM

## 2018-06-27 DIAGNOSIS — F5101 Primary insomnia: Secondary | ICD-10-CM

## 2018-06-27 MED ORDER — ZOLPIDEM TARTRATE 5 MG PO TABS: 5.0000 mg | ORAL_TABLET | Freq: Every evening | ORAL | 1 refills | Status: DC | PRN

## 2018-06-27 MED ORDER — TRAMADOL HCL 50 MG PO TABS: 50.0000 mg | ORAL_TABLET | Freq: Two times a day (BID) | ORAL | 0 refills | Status: DC | PRN

## 2018-06-27 NOTE — Telephone Encounter (Signed)
The problem with the Ambien is they are 10 days early, she last had it filled 06/06/2018, we can not track her Ambien because I can not find it on the database, feels like we're refilling in the blind. Dr. Mariea Clonts do you want all her controlled refills sent thru you?

## 2018-06-27 NOTE — Telephone Encounter (Signed)
Tramadol is not on patient's medication list, routed to [place], DO to further review and address

## 2018-06-27 NOTE — Telephone Encounter (Signed)
Cheryl Salas with [facility] called and stated that they have sent several refill request for Zolpidem and they have been Denied. She stated that they do Pill packs for patient and needs it refilled early.   Pended Rx and sent to Dr. Mariea Clonts for approval.

## 2018-07-18 ENCOUNTER — Other Ambulatory Visit: Payer: Self-pay | Admitting: Internal Medicine

## 2018-07-18 DIAGNOSIS — G8929 Other chronic pain: Secondary | ICD-10-CM

## 2018-07-18 DIAGNOSIS — M545 Low back pain, unspecified: Secondary | ICD-10-CM

## 2018-07-22 ENCOUNTER — Encounter: Payer: Self-pay | Admitting: *Deleted

## 2018-07-22 LAB — HM DIABETES EYE EXAM

## 2018-07-26 ENCOUNTER — Other Ambulatory Visit: Payer: Self-pay | Admitting: *Deleted

## 2018-07-26 DIAGNOSIS — M545 Low back pain, unspecified: Secondary | ICD-10-CM

## 2018-07-26 DIAGNOSIS — G8929 Other chronic pain: Secondary | ICD-10-CM

## 2018-07-26 MED ORDER — TRAMADOL HCL 50 MG PO TABS: 50.0000 mg | ORAL_TABLET | Freq: Two times a day (BID) | ORAL | 0 refills | Status: DC | PRN

## 2018-07-26 NOTE — Telephone Encounter (Signed)
[facility] 

## 2018-08-15 ENCOUNTER — Other Ambulatory Visit: Payer: Self-pay | Admitting: Internal Medicine

## 2018-08-15 DIAGNOSIS — Z932 Ileostomy status: Secondary | ICD-10-CM

## 2018-08-15 NOTE — Telephone Encounter (Signed)
Database checked and verified Last filled 07/22/18

## 2018-09-26 ENCOUNTER — Encounter: Payer: Self-pay | Admitting: Internal Medicine

## 2018-09-26 ENCOUNTER — Other Ambulatory Visit: Payer: Self-pay

## 2018-09-26 ENCOUNTER — Ambulatory Visit (INDEPENDENT_AMBULATORY_CARE_PROVIDER_SITE_OTHER): Payer: Medicare Other | Admitting: Internal Medicine

## 2018-09-26 VITALS — BP 120/70 | HR 102 | Temp 98.4°F | Ht 59.0 in | Wt 116.0 lb

## 2018-09-26 DIAGNOSIS — N183 Chronic kidney disease, stage 3 unspecified: Secondary | ICD-10-CM

## 2018-09-26 DIAGNOSIS — D508 Other iron deficiency anemias: Secondary | ICD-10-CM

## 2018-09-26 DIAGNOSIS — K5790 Diverticulosis of intestine, part unspecified, without perforation or abscess without bleeding: Secondary | ICD-10-CM

## 2018-09-26 DIAGNOSIS — H6123 Impacted cerumen, bilateral: Secondary | ICD-10-CM

## 2018-09-26 DIAGNOSIS — M810 Age-related osteoporosis without current pathological fracture: Secondary | ICD-10-CM | POA: Diagnosis not present

## 2018-09-26 DIAGNOSIS — E1122 Type 2 diabetes mellitus with diabetic chronic kidney disease: Secondary | ICD-10-CM | POA: Diagnosis not present

## 2018-09-26 DIAGNOSIS — Z932 Ileostomy status: Secondary | ICD-10-CM

## 2018-09-26 MED ORDER — CARBAMIDE PEROXIDE 6.5 % OT SOLN: 5.0000 [drp] | Freq: Every evening | OTIC | 0 refills | Status: DC | PRN

## 2018-09-26 NOTE — Patient Instructions (Signed)
If you are able to find the date of the pneumovax (23-valent pneumonia vaccine), please let us know.  We have the 2017 date for prevnar (13-valent booster). Give [place] a call mid-August to find out if we've got flu vaccines. Monitor your pulse over the next week.  If it is running up over 100 consistently, let me know.    You are doing great!  Keep up the good work.  It may be helpful to use debrox one week out of the month at bedtime to help prevent wax from getting impacted.

## 2018-09-26 NOTE — Progress Notes (Signed)
Location:  [facility]  Provider:   Code Status:  Goals of Care:  Advanced Directives 12/13/2017  Does Patient Have a Medical Advance Directive? Yes  Type of Paramedic of [place];Living will;Out of facility DNR (pink MOST or yellow form)  Does patient want to make changes to medical advance directive? No - Patient declined  Copy of [place] in Chart? No - copy requested     Chief Complaint  Patient presents with  . Medical Management of Chronic Issues    48mth follow-up    HPI: Patient is a 85 y.o. female seen today for medical management of chronic diseases.    She is present with her son today. She states she has been feeling well and has no complaints.   She is currently living with her son. Her daily activities include some light chores and walking around the garden. She uses a cane when she ambulates. She had a pelvic fracture a few months ago and believes she has fully recovered from that injury. She denies any recent falls or injuries.   Her son requests her ears be checked for wax today.  She is able to have regular bowel movements due to metamucil.          Past Medical History:  Diagnosis Date  . Allergic rhinitis    Per Records from [place]   . Anemia    Per Records from [place]   . Arthralgia of multiple sites    Per Records from [place]   . B12 deficiency    Per Records from [place]   . Baker's cyst    Per Records from [place]   . Bladder cancer ([place])   . CKD (chronic kidney disease) stage 3, GFR 30-59 ml/min (HCC)    Per Records from [place]   . Dehydration    Per Records from [place]   . Dextroscoliosis    Per Records from [place]   . Diabetes ([place])   . Diverticulitis    Per Records from [place]   . Diverticulosis   . Fecal occult blood test positive    Per Records from [place]   . Gout    left foot  . 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[place])    Per  Records from [place]   . Hypertension    Per Records from [place]   . Hypocalcemia    Per Records from [place]   . Hypomagnesemia    Per Records from [place]   . Iron deficiency    Per Records from [place]   . Large bowel obstruction [facility])    Per Records from [place]   . Mixed dyslipidemia    Per Records from [place]   . Myopathy    Per Records from [place]   . Onychomycosis    Per Records from [place]   . Peripheral neuropathy    Per Records from [place]   . Recurrent UTI   . Vitamin D deficiency    Per Records from [place]     Past Surgical History:  Procedure Laterality Date  . BLADDER SUSPENSION     Per Records from [place]   . CATARACT EXTRACTION, BILATERAL    . CHOLECYSTECTOMY  1978  . hysterectomy  1966  . ILEOSTOMY  2017  . KYPHOPLASTY  2019  . MASTECTOMY Right 1998  . MASTECTOMY Left 2011  . TRANSURETHRAL RESECTION OF BLADDER TUMOR      Allergies  Allergen Reactions  . Codeine Nausea Only  .  Flexeril [Cyclobenzaprine]     Per Records from [place]     Outpatient Encounter Medications as of 09/26/2018  Medication Sig  . acetaminophen (TYLENOL) 325 MG tablet Take 650 mg by mouth as needed.  Marland Kitchen alendronate (FOSAMAX) 70 MG tablet TAKE 1 TABLET BY MOUTH ONCE A WEEK WITH A FULL GLASS OF WATER ON AN EMPTY STOMACH  . carbamide peroxide (DEBROX) 6.5 % OTIC solution Place 5 drops into both ears at bedtime as needed (x 1 week).  . D3 SUPER STRENGTH 50 MCG (2000 UT) CAPS TAKE 1 CAPSULE BY MOUTH EVERY DAY  . diphenoxylate-atropine (LOMOTIL) 2.5-0.025 MG tablet TAKE 1 TABLET BY MOUTH EVERY MORNING, AT LUNCH, AND AT DINNER  . loperamide (IMODIUM) 2 MG capsule TAKE 1 CAPSULE BY MOUTH EVERY MORNING, 1 capsule AT LUNCH, AND 1 CAPSULE AT DINNER  . ondansetron (ZOFRAN-ODT) 4 MG disintegrating tablet Take 1 tablet (4 mg total) by mouth every 8 (eight) hours as needed for nausea or vomiting.  . pioglitazone (ACTOS)  15 MG tablet TAKE 1 TABLET BY MOUTH EVERY DAY  . POLY-IRON 150 150 MG capsule TAKE 1 CAPSULE BY MOUTH EVERY DAY  . psyllium (METAMUCIL) 58.6 % packet Take 1 packet by mouth at bedtime.  . simethicone (MYLICON) 960 MG chewable tablet Chew 125 mg by mouth as needed for flatulence. GasX  . sodium bicarbonate 650 MG tablet TAKE 1 TABLET BY MOUTH 2 TIMES DAILY  . traMADol (ULTRAM) 50 MG tablet Take 1 tablet (50 mg total) by mouth every 12 (twelve) hours as needed for severe pain.  . vitamin C (ASCORBIC ACID) 500 MG tablet TAKE 1 TABLET BY MOUTH EVERY DAY  . zolpidem (AMBIEN) 5 MG tablet Take 1 tablet (5 mg total) by mouth at bedtime as needed. for sleep  . [DISCONTINUED] carbamide peroxide (DEBROX) 6.5 % OTIC solution Place 5 drops into both ears 2 (two) times daily.  . [DISCONTINUED] Magnesium Oxide 400 (240 Mg) MG TABS TAKE 1 TABLET BY MOUTH EVERY DAY   No facility-administered encounter medications on file as of 09/26/2018.     Review of Systems:  Review of Systems  Constitutional: Negative for activity change, appetite change and fatigue.  HENT: Positive for ear discharge and hearing loss. Negative for dental problem, ear pain, tinnitus and trouble swallowing.   Respiratory: Negative for cough, shortness of breath and wheezing.   Cardiovascular: Negative for chest pain, palpitations and leg swelling.  Gastrointestinal: Negative for constipation, diarrhea and nausea.  Genitourinary: Negative for dysuria, hematuria and urgency.  Musculoskeletal: Positive for arthralgias. Negative for joint swelling and myalgias.  Skin: Negative.   Neurological: Negative for dizziness, syncope, numbness and headaches.  Hematological: Bruises/bleeds easily.  Psychiatric/Behavioral: Negative.     Health Maintenance  Topic Date Due  . FOOT EXAM  03/04/1936  . URINE MICROALBUMIN  03/04/1936  . TETANUS/TDAP  03/04/1945  . DEXA SCAN  03/05/1991  . PNA vac Low Risk Adult (2 of 2 - PPSV23) 03/09/2016  .  INFLUENZA VACCINE  10/08/2018  . HEMOGLOBIN A1C  11/16/2018  . OPHTHALMOLOGY EXAM  07/22/2019    Physical Exam: Vitals:   09/26/18 0837  BP: 120/70  Pulse: (!) 102  Temp: 98.4 F (36.9 C)  TempSrc: Oral  SpO2: 97%  Weight: 116 lb (52.6 kg)  Height: 4\' 11"  (1.499 m)   Body mass index is 23.43 kg/m. Physical Exam Constitutional:      General: She is not in acute distress.    Appearance: Normal appearance. She is  not ill-appearing.  HENT:     Right Ear: There is impacted cerumen.     Left Ear: There is impacted cerumen.  Cardiovascular:     Rate and Rhythm: Tachycardia present.     Pulses: Normal pulses.     Heart sounds: Normal heart sounds. No murmur.  Pulmonary:     Effort: Pulmonary effort is normal. No respiratory distress.     Breath sounds: Normal breath sounds. No wheezing.  Abdominal:     General: Abdomen is flat. Bowel sounds are normal.     Palpations: Abdomen is soft.  Musculoskeletal: Normal range of motion.     Right lower leg: No edema.     Left lower leg: No edema.  Skin:    General: Skin is warm and dry.     Capillary Refill: Capillary refill takes 2 to 3 seconds.  Neurological:     General: No focal deficit present.     Mental Status: She is alert and oriented to person, place, and time.     Sensory: No sensory deficit.     Comments: Monofilament test of left and right foot, normal sensation at all 10 sites   Psychiatric:        Mood and Affect: Mood normal.        Behavior: Behavior normal.     Labs reviewed: Basic Metabolic Panel: Recent Labs    03/07/18 1205 05/16/18 1607 05/18/18 1335  NA 139 135 137  K 5.3 6.1* 5.5*  CL 110 105 109  CO2 23 23 21   GLUCOSE 127 116 156*  BUN 25 34* 29*  CREATININE 1.64* 1.73* 1.60*  CALCIUM 9.2 9.4 9.0  MG  --  1.7  --    Liver Function Tests: Recent Labs    12/13/17 1525  AST 14  ALT 8  BILITOT 0.3  PROT 6.6   No results for input(s): LIPASE, AMYLASE in the last 8760 hours. No results  for input(s): AMMONIA in the last 8760 hours. CBC: Recent Labs    12/13/17 1525 03/07/18 1205 05/16/18 1607  WBC 5.2 5.8 5.4  NEUTROABS 2,558 3,758 2,916  HGB 9.7* 10.5* 9.5*  HCT 29.6* 32.4* 28.3*  MCV 100.0 98.8 98.3  PLT 241 225 294   Lipid Panel: No results for input(s): CHOL, HDL, LDLCALC, TRIG, CHOLHDL, LDLDIRECT in the last 8760 hours. Lab Results  Component Value Date   HGBA1C 5.8 (H) 05/16/2018    Procedures since last visit: No results found.  Assessment/Plan 1. Type 2 diabetes mellitus with stage 3 chronic kidney disease, without long-term current use of insulin (HCC) - Hemoglobin W9N - Basic metabolic panel - Microalbumin / creatinine urine ratio - continue diet low in sugar and carbs  2. Senile osteoporosis - continue falls prevention plan at home - continue to use cane when ambulating - no recent fractures or injuries, continue fosamax weekly  3. Chronic kidney disease, stage 3, mod decreased GFR (HCC) - Basic metabolic panel - CBC with Differential/Platelet - Microalbumin / creatinine urine ratio - avoid nephrotoxic medications like NSAIDS, adjust medications to renal dosages, hydrate  4. Ileostomy status ([place]) - stable, no issues with stoma or surrounding skin  output normal with daily metamucil  5. Other iron deficiency anemia - CBC with Differential/Platelet - tolerating iron supplement, continue current iron supplementation  6. Bilateral hearing loss due to cerumen impaction - left and right ear canals impacted with wax - flush left and right ear - Debrox drops for 1 week/ month  to prevent wax buildup    Labs/tests ordered:  CBC with differential/platelets, BMP, A1C, mircoalbumin/creatitine ratio Next appt:  01/09/2019

## 2018-09-27 LAB — CBC WITH DIFFERENTIAL/PLATELET
Absolute Monocytes: 336 cells/uL (ref 200–950)
Basophils Absolute: 40 cells/uL (ref 0–200)
Basophils Relative: 1 %
Eosinophils Absolute: 32 cells/uL (ref 15–500)
Eosinophils Relative: 0.8 %
HCT: 32 % — ABNORMAL LOW (ref 35.0–45.0)
Hemoglobin: 10.3 g/dL — ABNORMAL LOW (ref 11.7–15.5)
Lymphs Abs: 1488 cells/uL (ref 850–3900)
MCH: 33 pg (ref 27.0–33.0)
MCHC: 32.2 g/dL (ref 32.0–36.0)
MCV: 102.6 fL — ABNORMAL HIGH (ref 80.0–100.0)
MPV: 9.5 fL (ref 7.5–12.5)
Monocytes Relative: 8.4 %
Neutro Abs: 2104 cells/uL (ref 1500–7800)
Neutrophils Relative %: 52.6 %
Platelets: 199 10*3/uL (ref 140–400)
RBC: 3.12 10*6/uL — ABNORMAL LOW (ref 3.80–5.10)
RDW: 13 % (ref 11.0–15.0)
Total Lymphocyte: 37.2 %
WBC: 4 10*3/uL (ref 3.8–10.8)

## 2018-09-27 LAB — BASIC METABOLIC PANEL
BUN/Creatinine Ratio: 16 (calc) (ref 6–22)
BUN: 26 mg/dL — ABNORMAL HIGH (ref 7–25)
CO2: 23 mmol/L (ref 20–32)
Calcium: 9.3 mg/dL (ref 8.6–10.4)
Chloride: 110 mmol/L (ref 98–110)
Creat: 1.66 mg/dL — ABNORMAL HIGH (ref 0.60–0.88)
Glucose, Bld: 118 mg/dL (ref 65–139)
Potassium: 4.9 mmol/L (ref 3.5–5.3)
Sodium: 140 mmol/L (ref 135–146)

## 2018-09-27 LAB — HEMOGLOBIN A1C
Hgb A1c MFr Bld: 6 % of total Hgb — ABNORMAL HIGH (ref <5.7)
Mean Plasma Glucose: 126 (calc)
eAG (mmol/L): 7 (calc)

## 2018-09-27 LAB — MICROALBUMIN / CREATININE URINE RATIO
Creatinine, Urine: 108 mg/dL (ref 20–275)
Microalb Creat Ratio: 8 mcg/mg creat (ref <30)
Microalb, Ur: 0.9 mg/dL

## 2018-10-13 ENCOUNTER — Other Ambulatory Visit: Payer: Self-pay | Admitting: Internal Medicine

## 2018-10-13 DIAGNOSIS — E1122 Type 2 diabetes mellitus with diabetic chronic kidney disease: Secondary | ICD-10-CM

## 2018-10-13 DIAGNOSIS — M810 Age-related osteoporosis without current pathological fracture: Secondary | ICD-10-CM

## 2018-10-16 ENCOUNTER — Encounter: Payer: Self-pay | Admitting: Internal Medicine

## 2018-10-31 ENCOUNTER — Other Ambulatory Visit: Payer: Self-pay | Admitting: Internal Medicine

## 2018-10-31 DIAGNOSIS — G8929 Other chronic pain: Secondary | ICD-10-CM

## 2018-10-31 DIAGNOSIS — M545 Low back pain, unspecified: Secondary | ICD-10-CM

## 2018-11-04 ENCOUNTER — Other Ambulatory Visit: Payer: Self-pay

## 2018-11-04 ENCOUNTER — Ambulatory Visit (INDEPENDENT_AMBULATORY_CARE_PROVIDER_SITE_OTHER): Payer: Medicare Other | Admitting: *Deleted

## 2018-11-04 DIAGNOSIS — Z23 Encounter for immunization: Secondary | ICD-10-CM

## 2018-12-05 ENCOUNTER — Other Ambulatory Visit: Payer: Self-pay | Admitting: Internal Medicine

## 2018-12-05 DIAGNOSIS — F5101 Primary insomnia: Secondary | ICD-10-CM

## 2018-12-05 NOTE — Telephone Encounter (Signed)
Unable to check database  to verify last refill due to no access.  Pending and routing to provider for approval

## 2018-12-21 ENCOUNTER — Telehealth: Payer: Self-pay

## 2018-12-21 NOTE — Telephone Encounter (Signed)
Message left on clinical intake voicemail:   Patient's son left message asking if we have documentation of patient receiving Shingrix vaccine and if so what dates did she receive. Jude will be seeing patients today and asked that we return call and leave detailed message with response.   Refer to [employer] from 12/21/2018. Dee/CMA sent message to patient/son informing them RX for shingrix was sent to the pharmacy as requested.  I called Dr.Granzow (patients son) and informed him of the interaction in October 2019 and advised that a RX is no longer required and he can walk in to receive vaccine. Dr.Higley verbalized understanding

## 2019-01-02 ENCOUNTER — Other Ambulatory Visit: Payer: Self-pay | Admitting: Internal Medicine

## 2019-01-02 DIAGNOSIS — Z932 Ileostomy status: Secondary | ICD-10-CM

## 2019-01-02 NOTE — Telephone Encounter (Signed)
RX request sent to [facility], DO to confirm ok to fill both medications  .  Lomotil is a controlled substance also, last filled 08/15/2018 #90/3 refills

## 2019-01-09 ENCOUNTER — Other Ambulatory Visit: Payer: Self-pay

## 2019-01-09 ENCOUNTER — Ambulatory Visit (INDEPENDENT_AMBULATORY_CARE_PROVIDER_SITE_OTHER): Payer: Medicare Other | Admitting: Internal Medicine

## 2019-01-09 ENCOUNTER — Encounter: Payer: Self-pay | Admitting: Internal Medicine

## 2019-01-09 VITALS — BP 136/80 | HR 74 | Temp 98.2°F | Ht 59.0 in | Wt 120.6 lb

## 2019-01-09 DIAGNOSIS — E1121 Type 2 diabetes mellitus with diabetic nephropathy: Secondary | ICD-10-CM | POA: Diagnosis not present

## 2019-01-09 DIAGNOSIS — M810 Age-related osteoporosis without current pathological fracture: Secondary | ICD-10-CM | POA: Diagnosis not present

## 2019-01-09 DIAGNOSIS — Z7189 Other specified counseling: Secondary | ICD-10-CM | POA: Diagnosis not present

## 2019-01-09 DIAGNOSIS — H6123 Impacted cerumen, bilateral: Secondary | ICD-10-CM | POA: Diagnosis not present

## 2019-01-09 DIAGNOSIS — F5101 Primary insomnia: Secondary | ICD-10-CM

## 2019-01-09 DIAGNOSIS — E1122 Type 2 diabetes mellitus with diabetic chronic kidney disease: Secondary | ICD-10-CM

## 2019-01-09 DIAGNOSIS — Z932 Ileostomy status: Secondary | ICD-10-CM | POA: Diagnosis not present

## 2019-01-09 DIAGNOSIS — N1831 Chronic kidney disease, stage 3a: Secondary | ICD-10-CM

## 2019-01-09 NOTE — Progress Notes (Signed)
Location:  [facility] clinic Provider:  Evana Runnels L. Mariea Clonts, D.O., C.M.D.  Code Status: DNR--discussed today Goals of Care:  Advanced Directives 01/09/2019  Does Patient Have a Medical Advance Directive? Yes  Type of Paramedic of [place];Living will;Out of facility DNR (pink MOST or yellow form)  Does patient want to make changes to medical advance directive? No - Patient declined  Copy of [place] in Chart? -     Chief Complaint  Patient presents with  . Medical Management of Chronic Issues    4 month follow up. Patient states she is feeling a bit better than before.   Marland Kitchen Health Maintenance    TDAP and Dexa    HPI: Patient is a 85 y.o. female seen today for medical management of chronic diseases.    She feels like she is getting around better than before.  Bowel situation doing ok.  Bag can be aggravating.  Still has to take metamucil to keep bowels more solid.    No pains.    Sleeps well with her little pill most night.    Her weight is up 4 lbs, but she's wearing sneakers and a sweater today.    Pulse normal today.  She's had no issues with edema.    She did go get her shingrix vaccine at the pharmacy on 12/22/18.  We discussed code status--CPR.  She has a living will and health care power of attorney (her son, Jude).  She says that if she were sick and not getting better, she would not want her life prolonged.  If her heart stopped, she would not want CPR done to bring her back.  She has said this previously, as well.    Past Medical History:  Diagnosis Date  . Allergic rhinitis    Per Records from [place]   . Anemia    Per Records from [place]   . Arthralgia of multiple sites    Per Records from [place]   . B12 deficiency    Per Records from [place]   . Baker's cyst    Per Records from [place]   . Bladder cancer ([place])   . CKD (chronic kidney disease) stage 3, GFR 30-59 ml/min    Per Records from  [place]   . Dehydration    Per Records from [place]   . Dextroscoliosis    Per Records from [place]   . Diabetes ([place])   . Diverticulitis    Per Records from [place]   . Diverticulosis   . Fecal occult blood test positive    Per Records from [place]   . Gout    left foot  . 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[place])    Per Records from [place]   . Hypertension    Per Records from [place]   . Hypocalcemia    Per Records from [place]   . Hypomagnesemia    Per Records from [place]   . Iron deficiency    Per Records from [place]   . Large bowel obstruction [facility]    Per Records from [place]   . Mixed dyslipidemia    Per Records from [place]   . Myopathy    Per Records from [place]   . Onychomycosis    Per Records from [place]   . Peripheral neuropathy    Per Records from [place]   . Recurrent UTI   . Vitamin D deficiency    Per Records from [place]     Past Surgical History:  Procedure Laterality Date  . BLADDER SUSPENSION     Per Records from [place]   . CATARACT EXTRACTION, BILATERAL    . CHOLECYSTECTOMY  1978  . hysterectomy  1966  . ILEOSTOMY  2017  . KYPHOPLASTY  2019  . MASTECTOMY Right 1998  . MASTECTOMY Left 2011  . TRANSURETHRAL RESECTION OF BLADDER TUMOR      Allergies  Allergen Reactions  . Codeine Nausea Only  . Flexeril [Cyclobenzaprine]     Per Records from [place]     Outpatient Encounter Medications as of 01/09/2019  Medication Sig  . acetaminophen (TYLENOL) 325 MG tablet Take 650 mg by mouth as needed.  Marland Kitchen alendronate (FOSAMAX) 70 MG tablet TAKE 1 TABLET BY MOUTH ONCE A WEEK WITH A FULL GLASS OF WATER ON AN EMPTY STOMACH  . carbamide peroxide (DEBROX) 6.5 % OTIC solution Place 5 drops into both ears at bedtime as needed (x 1 week).  . D3 SUPER STRENGTH 50 MCG (2000 UT) CAPS TAKE 1 CAPSULE BY MOUTH EVERY DAY  . diphenoxylate-atropine (LOMOTIL) 2.5-0.025 MG tablet TAKE 1  TABLET BY MOUTH EVERY MORNING, AT LUNCH AND AT dinner  . loperamide (IMODIUM) 2 MG capsule TAKE 1 CAPSULE BY MOUTH EVERY MORNING, 1 CAPSULE AT LUNCH AND 1 CAPSULE AT DINNER  . ondansetron (ZOFRAN-ODT) 4 MG disintegrating tablet Take 1 tablet (4 mg total) by mouth every 8 (eight) hours as needed for nausea or vomiting.  . pioglitazone (ACTOS) 15 MG tablet TAKE 1 TABLET BY MOUTH EVERY DAY  . POLY-IRON 150 150 MG capsule TAKE 1 CAPSULE BY MOUTH EVERY DAY  . psyllium (METAMUCIL) 58.6 % packet Take 1 packet by mouth at bedtime.  . simethicone (MYLICON) 0000000 MG chewable tablet Chew 125 mg by mouth as needed for flatulence. GasX  . sodium bicarbonate 650 MG tablet TAKE 1 TABLET BY MOUTH 2 TIMES DAILY  . traMADol (ULTRAM) 50 MG tablet TAKE 1 TABLET BY MOUTH EVERY TWELVE HOURS AS NEEDED FOR SEVERE PAIN  . vitamin C (ASCORBIC ACID) 500 MG tablet TAKE 1 TABLET BY MOUTH EVERY DAY  . zolpidem (AMBIEN) 5 MG tablet TAKE 1 TABLET BY MOUTH AT BEDTIME AS NEEDED FOR SLEEP   No facility-administered encounter medications on file as of 01/09/2019.     Review of Systems:  Review of Systems  Constitutional: Negative for chills, fever, malaise/fatigue and weight loss.  HENT: Positive for hearing loss. Negative for sore throat.        More difficulty hearing at dinner  Eyes: Negative for blurred vision.  Respiratory: Negative for cough and shortness of breath.   Cardiovascular: Negative for chest pain, palpitations and leg swelling.  Gastrointestinal: Positive for constipation and diarrhea. Negative for abdominal pain, blood in stool and melena.       Ostomy--gets liquid stool unless uses her metamucil  Genitourinary: Negative for dysuria.  Musculoskeletal: Negative for falls and joint pain.  Skin: Negative for itching and rash.  Neurological: Negative for dizziness, focal weakness and loss of consciousness.  Endo/Heme/Allergies: Bruises/bleeds easily.  Psychiatric/Behavioral: Positive for memory loss. Negative  for depression. The patient has insomnia. The patient is not nervous/anxious.        Some short term memory loss    Health Maintenance  Topic Date Due  . TETANUS/TDAP  03/04/1945  . DEXA SCAN  03/05/1991  . HEMOGLOBIN A1C  03/29/2019  . OPHTHALMOLOGY EXAM  07/22/2019  . FOOT EXAM  09/26/2019  . URINE MICROALBUMIN  09/26/2019  . INFLUENZA VACCINE  Completed  .  PNA vac Low Risk Adult  Completed    Physical Exam: Vitals:   01/09/19 0839  BP: 136/80  Pulse: 74  Temp: 98.2 F (36.8 C)  SpO2: 96%  Weight: 120 lb 9.6 oz (54.7 kg)  Height: 4\' 11"  (1.499 m)   Body mass index is 24.36 kg/m. Physical Exam Vitals signs reviewed.  Constitutional:      General: She is not in acute distress.    Appearance: Normal appearance. She is normal weight. She is not ill-appearing or toxic-appearing.  HENT:     Head: Normocephalic and atraumatic.  Eyes:     Extraocular Movements: Extraocular movements intact.     Pupils: Pupils are equal, round, and reactive to light.  Cardiovascular:     Rate and Rhythm: Normal rate and regular rhythm.     Pulses: Normal pulses.     Heart sounds: Normal heart sounds.  Pulmonary:     Effort: Pulmonary effort is normal.     Breath sounds: Normal breath sounds. No wheezing, rhonchi or rales.  Abdominal:     General: Bowel sounds are normal. There is no distension.     Tenderness: There is no abdominal tenderness.     Comments: Ostomy in place  Musculoskeletal: Normal range of motion.     Right lower leg: No edema.     Left lower leg: No edema.     Comments: Walks with cane  Skin:    General: Skin is warm and dry.     Capillary Refill: Capillary refill takes less than 2 seconds.  Neurological:     General: No focal deficit present.     Mental Status: She is alert and oriented to person, place, and time.     Cranial Nerves: No cranial nerve deficit.     Gait: Gait abnormal.  Psychiatric:        Mood and Affect: Mood normal.        Behavior:  Behavior normal.     Labs reviewed: Basic Metabolic Panel: Recent Labs    05/16/18 1607 05/18/18 1335 09/26/18 0914  NA 135 137 140  K 6.1* 5.5* 4.9  CL 105 109 110  CO2 23 21 23   GLUCOSE 116 156* 118  BUN 34* 29* 26*  CREATININE 1.73* 1.60* 1.66*  CALCIUM 9.4 9.0 9.3  MG 1.7  --   --    Liver Function Tests: No results for input(s): AST, ALT, ALKPHOS, BILITOT, PROT, ALBUMIN in the last 8760 hours. No results for input(s): LIPASE, AMYLASE in the last 8760 hours. No results for input(s): AMMONIA in the last 8760 hours. CBC: Recent Labs    03/07/18 1205 05/16/18 1607 09/26/18 0914  WBC 5.8 5.4 4.0  NEUTROABS 3,758 2,916 2,104  HGB 10.5* 9.5* 10.3*  HCT 32.4* 28.3* 32.0*  MCV 98.8 98.3 102.6*  PLT 225 294 199   Lipid Panel: No results for input(s): CHOL, HDL, LDLCALC, TRIG, CHOLHDL, LDLDIRECT in the last 8760 hours. Lab Results  Component Value Date   HGBA1C 6.0 (H) 09/26/2018    Procedures since last visit: No results found.  Assessment/Plan 1. Type 2 diabetes mellitus with stage 3a chronic kidney disease, without long-term current use of insulin ([place]) - has been very well controlled -f/u labs before next visit - CBC with Differential/Platelet; Future - COMPLETE METABOLIC PANEL WITH GFR; Future - Hemoglobin A1c; Future  2. Stage 3a chronic kidney disease -f/u for current GFR next time - COMPLETE METABOLIC PANEL WITH GFR; Future -Avoid nephrotoxic agents like  nsaids, dose adjust renally excreted meds, hydrate.  3. Senile osteoporosis -continues on fosamax therapy -ideally should get bone density in new year  4. Bilateral hearing loss due to cerumen impaction -advised to use debrox for one week each month -cerumen flushed with warm water and peroxide to improve hearing today  5. Ileostomy status ([place]) -remains, uses metamucil to bulk and imodium and lomotil when very loose  6. Primary insomnia -does ok with her Lorrin Mais, has been a longstanding  issue  7. ACP (advance care planning) -spent 18 mins discussing code status and purpose of [place] and MOST forms vs living will and hcpoa -pt and her son now understand difference -they will review MOST so we can complete at next visit -DNR order entered and gold form completed--original given to Jude to keep with mom's meds and copy made to be scanned in MOST (with DOB in place)  Labs/tests ordered:   Lab Orders     CBC with Differential/Platelet     COMPLETE METABOLIC PANEL WITH GFR     Hemoglobin A1c  For second shingrix in Dec and then will get tdap later  Next appt:  4 mos with fasting labs before   Jireh Elmore L. Oluwasemilore Bahl, D.O. [facility] [address], [place] [address] Cell Phone (Mon-Fri 8am-5pm):  [phone] On Call:  [phone] & follow prompts after 5pm & weekends Office Phone:  [phone] Office Fax:  [phone]

## 2019-01-09 NOTE — Patient Instructions (Signed)
Please review the MOST form and we can complete it electronically at your next routine visit.    Be sure to use the debrox drops in your ears nightly for one week out of each month (start the same time that you take your fosamax).

## 2019-02-06 ENCOUNTER — Other Ambulatory Visit: Payer: Self-pay | Admitting: Internal Medicine

## 2019-02-06 DIAGNOSIS — G8929 Other chronic pain: Secondary | ICD-10-CM

## 2019-02-06 DIAGNOSIS — M545 Low back pain, unspecified: Secondary | ICD-10-CM

## 2019-02-20 ENCOUNTER — Encounter: Payer: Self-pay | Admitting: Internal Medicine

## 2019-04-07 ENCOUNTER — Encounter: Payer: Self-pay | Admitting: Internal Medicine

## 2019-04-07 DIAGNOSIS — M545 Low back pain, unspecified: Secondary | ICD-10-CM

## 2019-04-07 DIAGNOSIS — G8929 Other chronic pain: Secondary | ICD-10-CM

## 2019-04-20 ENCOUNTER — Other Ambulatory Visit: Payer: Self-pay | Admitting: Internal Medicine

## 2019-04-20 DIAGNOSIS — E1122 Type 2 diabetes mellitus with diabetic chronic kidney disease: Secondary | ICD-10-CM

## 2019-04-20 DIAGNOSIS — N183 Chronic kidney disease, stage 3 unspecified: Secondary | ICD-10-CM

## 2019-04-20 DIAGNOSIS — M810 Age-related osteoporosis without current pathological fracture: Secondary | ICD-10-CM

## 2019-05-05 ENCOUNTER — Other Ambulatory Visit: Payer: Medicare Other

## 2019-05-05 ENCOUNTER — Other Ambulatory Visit: Payer: Self-pay

## 2019-05-05 DIAGNOSIS — N1831 Chronic kidney disease, stage 3a: Secondary | ICD-10-CM

## 2019-05-05 DIAGNOSIS — E1122 Type 2 diabetes mellitus with diabetic chronic kidney disease: Secondary | ICD-10-CM

## 2019-05-06 LAB — COMPLETE METABOLIC PANEL WITH GFR
AG Ratio: 1.8 (calc) (ref 1.0–2.5)
ALT: 8 U/L (ref 6–29)
AST: 13 U/L (ref 10–35)
Albumin: 4.2 g/dL (ref 3.6–5.1)
Alkaline phosphatase (APISO): 79 U/L (ref 37–153)
BUN/Creatinine Ratio: 14 (calc) (ref 6–22)
BUN: 21 mg/dL (ref 7–25)
CO2: 23 mmol/L (ref 20–32)
Calcium: 9.2 mg/dL (ref 8.6–10.4)
Chloride: 105 mmol/L (ref 98–110)
Creat: 1.51 mg/dL — ABNORMAL HIGH (ref 0.60–0.88)
GFR, Est African American: 34 mL/min/{1.73_m2} — ABNORMAL LOW (ref >=60)
GFR, Est Non African American: 29 mL/min/{1.73_m2} — ABNORMAL LOW (ref >=60)
Globulin: 2.3 g/dL (calc) (ref 1.9–3.7)
Glucose, Bld: 132 mg/dL — ABNORMAL HIGH (ref 65–99)
Potassium: 4.6 mmol/L (ref 3.5–5.3)
Sodium: 139 mmol/L (ref 135–146)
Total Bilirubin: 0.4 mg/dL (ref 0.2–1.2)
Total Protein: 6.5 g/dL (ref 6.1–8.1)

## 2019-05-06 LAB — CBC WITH DIFFERENTIAL/PLATELET
Absolute Monocytes: 399 cells/uL (ref 200–950)
Basophils Absolute: 51 cells/uL (ref 0–200)
Basophils Relative: 0.9 %
Eosinophils Absolute: 40 cells/uL (ref 15–500)
Eosinophils Relative: 0.7 %
HCT: 29 % — ABNORMAL LOW (ref 35.0–45.0)
Hemoglobin: 9.7 g/dL — ABNORMAL LOW (ref 11.7–15.5)
Lymphs Abs: 1756 cells/uL (ref 850–3900)
MCH: 33.3 pg — ABNORMAL HIGH (ref 27.0–33.0)
MCHC: 33.4 g/dL (ref 32.0–36.0)
MCV: 99.7 fL (ref 80.0–100.0)
MPV: 9.2 fL (ref 7.5–12.5)
Monocytes Relative: 7 %
Neutro Abs: 3454 cells/uL (ref 1500–7800)
Neutrophils Relative %: 60.6 %
Platelets: 252 10*3/uL (ref 140–400)
RBC: 2.91 10*6/uL — ABNORMAL LOW (ref 3.80–5.10)
RDW: 12.2 % (ref 11.0–15.0)
Total Lymphocyte: 30.8 %
WBC: 5.7 10*3/uL (ref 3.8–10.8)

## 2019-05-06 LAB — HEMOGLOBIN A1C
Hgb A1c MFr Bld: 6.4 % of total Hgb — ABNORMAL HIGH (ref <5.7)
Mean Plasma Glucose: 137 (calc)
eAG (mmol/L): 7.6 (calc)

## 2019-05-08 NOTE — Progress Notes (Signed)
Sugar average has crept up to 6.4 which is almost diabetic range. Kidney function looks a little better. Electrolytes and liver are normal. Anemia looks slightly worse again (goes up and down) We'll review at her visit.

## 2019-05-15 ENCOUNTER — Other Ambulatory Visit: Payer: Self-pay

## 2019-05-15 ENCOUNTER — Ambulatory Visit (INDEPENDENT_AMBULATORY_CARE_PROVIDER_SITE_OTHER): Payer: Medicare Other | Admitting: Internal Medicine

## 2019-05-15 VITALS — BP 118/78 | HR 92 | Temp 97.5°F | Ht 59.0 in | Wt 122.0 lb

## 2019-05-15 DIAGNOSIS — M545 Low back pain, unspecified: Secondary | ICD-10-CM

## 2019-05-15 DIAGNOSIS — R0781 Pleurodynia: Secondary | ICD-10-CM

## 2019-05-15 DIAGNOSIS — N1831 Chronic kidney disease, stage 3a: Secondary | ICD-10-CM

## 2019-05-15 DIAGNOSIS — D500 Iron deficiency anemia secondary to blood loss (chronic): Secondary | ICD-10-CM

## 2019-05-15 DIAGNOSIS — Z932 Ileostomy status: Secondary | ICD-10-CM

## 2019-05-15 DIAGNOSIS — E1122 Type 2 diabetes mellitus with diabetic chronic kidney disease: Secondary | ICD-10-CM

## 2019-05-15 DIAGNOSIS — W19XXXA Unspecified fall, initial encounter: Secondary | ICD-10-CM

## 2019-05-15 DIAGNOSIS — M810 Age-related osteoporosis without current pathological fracture: Secondary | ICD-10-CM

## 2019-05-15 DIAGNOSIS — G8929 Other chronic pain: Secondary | ICD-10-CM

## 2019-05-15 DIAGNOSIS — E1121 Type 2 diabetes mellitus with diabetic nephropathy: Secondary | ICD-10-CM

## 2019-05-15 NOTE — Progress Notes (Signed)
Location:  [facility] clinic  Provider: Dr. Hollace Kinnier  Goals of Care:  Advanced Directives 05/15/2019  Does Patient Have a Medical Advance Directive? Yes  Type of Advance Directive Out of facility DNR (pink MOST or yellow form)  Does patient want to make changes to medical advance directive? No - Patient declined  Copy of [place] in Chart? -     Chief Complaint  Patient presents with  . Medical Management of Chronic Issues    4 month follow up     HPI: Patient is a 86 y.o. female seen today for medical management of chronic diseases.    Son present for visit.   She had her second Shingrix vaccine in December 2020 at [facility].    Covid vaccine- both series done, no reactons.   She fell during a family super bowl party. She does not remember falling. Injury occurred over the left side of her ribs and left side of her face. Her back pain had also increased after falling. Son took her to see Dr. Nelva Bush and she was given a injection for the back pain.X-rays were also taken and ruled out fracture. Since initial onset, the soreness on her left side is still present. The soreness is agitated with activity. She will only take tylenol if the pain is severe. Uses a front wheeled walker around the house. Requesting a four point cane prescription and home health physical therapy consult. Life alert present on left wrist.    She is not interested in having a bone density study at this time. - She claims taking fosamax is enough at this time.   Her ileostomy is having normal output with daily metamucil. No complaints.   She is still hard of hearing but using debrox drops a few times a week to prevent wax buildup. Requesting ears be checked today.   Past Medical History:  Diagnosis Date  . Allergic rhinitis    Per Records from [place]   . Anemia    Per Records from [place]   . Arthralgia of multiple sites    Per Records from [place]   . B12  deficiency    Per Records from [place]   . Baker's cyst    Per Records from [place]   . Bladder cancer ([place])   . CKD (chronic kidney disease) stage 3, GFR 30-59 ml/min    Per Records from [place]   . Dehydration    Per Records from [place]   . Dextroscoliosis    Per Records from [place]   . Diabetes ([place])   . Diverticulitis    Per Records from [place]   . Diverticulosis   . Fecal occult blood test positive    Per Records from [place]   . Gout    left foot  . 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[place])    Per Records from [place]   . Hypertension    Per Records from [place]   . Hypocalcemia    Per Records from [place]   . Hypomagnesemia    Per Records from [place]   . Iron deficiency    Per Records from [place]   . Large bowel obstruction [facility])    Per Records from [place]   . Mixed dyslipidemia    Per Records from [place]   . Myopathy    Per Records from [place]   . Onychomycosis    Per Records from [place]   . Peripheral neuropathy    Per Records from [place]   .  Recurrent UTI   . Vitamin D deficiency    Per Records from [place]     Past Surgical History:  Procedure Laterality Date  . BLADDER SUSPENSION     Per Records from [place]   . CATARACT EXTRACTION, BILATERAL    . CHOLECYSTECTOMY  1978  . hysterectomy  1966  . ILEOSTOMY  2017  . KYPHOPLASTY  2019  . MASTECTOMY Right 1998  . MASTECTOMY Left 2011  . TRANSURETHRAL RESECTION OF BLADDER TUMOR      Allergies  Allergen Reactions  . Codeine Nausea Only  . Flexeril [Cyclobenzaprine]     Per Records from [place]     Outpatient Encounter Medications as of 05/15/2019  Medication Sig  . acetaminophen (TYLENOL) 325 MG tablet Take 650 mg by mouth as needed.  Marland Kitchen alendronate (FOSAMAX) 70 MG tablet TAKE 1 TABLET BY MOUTH ONCE A WEEK WITH A FULL GLASS OF WATER ON AN EMPTY STOMACH  . ascorbic acid (VITAMIN C) 500 MG tablet TAKE 1  TABLET BY MOUTH EVERY DAY  . carbamide peroxide (DEBROX) 6.5 % OTIC solution Place 5 drops into both ears at bedtime as needed (x 1 week).  . D3 SUPER STRENGTH 50 MCG (2000 UT) CAPS TAKE 1 CAPSULE BY MOUTH EVERY DAY  . diphenoxylate-atropine (LOMOTIL) 2.5-0.025 MG tablet TAKE 1 TABLET BY MOUTH EVERY MORNING, AT LUNCH AND AT dinner  . loperamide (IMODIUM) 2 MG capsule TAKE 1 CAPSULE BY MOUTH EVERY MORNING, 1 CAPSULE AT LUNCH AND 1 CAPSULE AT DINNER  . ondansetron (ZOFRAN-ODT) 4 MG disintegrating tablet Take 1 tablet (4 mg total) by mouth every 8 (eight) hours as needed for nausea or vomiting.  . pioglitazone (ACTOS) 15 MG tablet TAKE 1 TABLET BY MOUTH EVERY DAY  . POLY-IRON 150 150 MG capsule TAKE 1 CAPSULE BY MOUTH EVERY DAY  . psyllium (METAMUCIL) 58.6 % packet Take 1 packet by mouth at bedtime.  . simethicone (MYLICON) 0000000 MG chewable tablet Chew 125 mg by mouth as needed for flatulence. GasX  . sodium bicarbonate 650 MG tablet TAKE 1 TABLET BY MOUTH 2 TIMES DAILY  . traMADol (ULTRAM) 50 MG tablet TAKE 1 TABLET BY MOUTH EVERY TWELVE HOURS AS NEEDED FOR SEVERE PAIN  . zolpidem (AMBIEN) 5 MG tablet TAKE 1 TABLET BY MOUTH AT BEDTIME AS NEEDED FOR SLEEP   No facility-administered encounter medications on file as of 05/15/2019.    Review of Systems:  Review of Systems  Constitutional: Negative for activity change, appetite change and fatigue.  HENT: Positive for hearing loss. Negative for dental problem and trouble swallowing.   Cardiovascular: Negative for chest pain and leg swelling.  Gastrointestinal: Negative for abdominal pain, constipation, diarrhea and nausea.       Ileostomy  Genitourinary: Negative for dysuria and frequency.  Musculoskeletal: Positive for back pain.       Left sided soreness and pain  Skin: Negative.   Neurological: Negative for dizziness, light-headedness and headaches.  Psychiatric/Behavioral: Positive for sleep disturbance. Negative for dysphoric mood. The patient  is not nervous/anxious.        Chronic insomnia    Health Maintenance  Topic Date Due  . TETANUS/TDAP  03/04/1943  . DEXA SCAN  03/05/1991  . OPHTHALMOLOGY EXAM  07/22/2019  . FOOT EXAM  09/26/2019  . URINE MICROALBUMIN  09/26/2019  . HEMOGLOBIN A1C  11/02/2019  . INFLUENZA VACCINE  Completed  . PNA vac Low Risk Adult  Completed    Physical Exam: There were no vitals filed for  this visit. There is no height or weight on file to calculate BMI. Physical Exam Vitals reviewed.  Constitutional:      Appearance: Normal appearance. She is normal weight.  HENT:     Head: Normocephalic.     Right Ear: Tympanic membrane normal. There is no impacted cerumen.     Left Ear: Tympanic membrane normal. There is no impacted cerumen.  Cardiovascular:     Rate and Rhythm: Normal rate and regular rhythm.     Pulses: Normal pulses.     Heart sounds: Normal heart sounds. No murmur.  Pulmonary:     Effort: Pulmonary effort is normal. No respiratory distress.     Breath sounds: Normal breath sounds. No wheezing.  Abdominal:     General: Bowel sounds are normal.     Palpations: Abdomen is soft.     Comments: Ostomy in place, stoma red  Musculoskeletal:     Comments: Ambulates with cane  Skin:    General: Skin is warm and dry.     Capillary Refill: Capillary refill takes less than 2 seconds.  Neurological:     General: No focal deficit present.     Mental Status: She is alert and oriented to person, place, and time. Mental status is at baseline.     Gait: Gait abnormal.  Psychiatric:        Mood and Affect: Mood normal.        Behavior: Behavior normal.        Thought Content: Thought content normal.        Judgment: Judgment normal.     Labs reviewed: Basic Metabolic Panel: Recent Labs    05/16/18 1607 05/16/18 1607 05/18/18 1335 09/26/18 0914 05/05/19 0807  NA 135   < > 137 140 139  K 6.1*   < > 5.5* 4.9 4.6  CL 105   < > 109 110 105  CO2 23   < > 21 23 23   GLUCOSE 116   <  > 156* 118 132*  BUN 34*   < > 29* 26* 21  CREATININE 1.73*   < > 1.60* 1.66* 1.51*  CALCIUM 9.4   < > 9.0 9.3 9.2  MG 1.7  --   --   --   --    < > = values in this interval not displayed.   Liver Function Tests: Recent Labs    05/05/19 0807  AST 13  ALT 8  BILITOT 0.4  PROT 6.5   No results for input(s): LIPASE, AMYLASE in the last 8760 hours. No results for input(s): AMMONIA in the last 8760 hours. CBC: Recent Labs    05/16/18 1607 09/26/18 0914 05/05/19 0807  WBC 5.4 4.0 5.7  NEUTROABS 2,916 2,104 3,454  HGB 9.5* 10.3* 9.7*  HCT 28.3* 32.0* 29.0*  MCV 98.3 102.6* 99.7  PLT 294 199 252   Lipid Panel: No results for input(s): CHOL, HDL, LDLCALC, TRIG, CHOLHDL, LDLDIRECT in the last 8760 hours. Lab Results  Component Value Date   HGBA1C 6.4 (H) 05/05/2019    Procedures since last visit: No results found.  Assessment/Plan 1. Fall, initial encounter - x-ray r/o by Dr. Nelva Bush for fracture - she is a high risk for falling with fracture due to age and postmenopausal status - referral for home health physical therapy - continue to use front wheeled walker - recommend physical therapy consult guide further equipment needs - continue to wear life alert bracelet  2. Chronic midline low back pain  without sciatica - followed by Dr. Nelva Bush - injections appear to help pain - continue to use tylenol 325 mg PRN for back pain  3. Rib pain on left side - occurrence associated with fall in February 2021 - fracture r/o by Dr. Nelva Bush - exam negative for deformity or bruising - continue to wear lose clothing - continue tylenol 325 mg PRN for pain 4. Ileostomy status ([place]) - stable - continue to use metamucil to bulk stool and lomotil when lose  5. Senile osteoporosis  - continue fosamax therapy - patient and family refusing bone density test at this time - falls prevention plan discussed with patient and family - continue to ambulate with front wheeled walker - continue  to wear life alert bracelet - referral for home health physical therapy  6. Type 2 diabetes mellitus with stage 3a chronic kidney disease, without long-term current use of insulin (HCC) - well controlled with diet oral medication - A1C has slightly trended up to 6.4, suspect steroid injection as contributing factor - CBC with differential/platelets- future - Complete metabolic panel with GFR- future - hemoglobin A1C- future - avoid medications that are nephrotoxic like nsaids, and dose adjust renally excreted meds, hydrate  7. Iron deficiency anemia due to chronic blood loss -hemoglobin down to 9.7 - recheck CBC with differential/platelets next visit   Labs/tests ordered:  CBC with differential/platelets, hemoglobin A1C, Complete metabolic panel with GFR- future, home health physical therapy Next appt:  Visit date not found

## 2019-05-19 ENCOUNTER — Telehealth: Payer: Self-pay | Admitting: *Deleted

## 2019-05-19 DIAGNOSIS — G8929 Other chronic pain: Secondary | ICD-10-CM

## 2019-05-19 DIAGNOSIS — R0789 Other chest pain: Secondary | ICD-10-CM

## 2019-05-19 DIAGNOSIS — M545 Low back pain, unspecified: Secondary | ICD-10-CM

## 2019-05-19 DIAGNOSIS — W19XXXA Unspecified fall, initial encounter: Secondary | ICD-10-CM

## 2019-05-19 DIAGNOSIS — R0781 Pleurodynia: Secondary | ICD-10-CM

## 2019-05-19 NOTE — Telephone Encounter (Signed)
Patient son, Jude called and stated that patient is suppose to have [place] Referral and they have not heard from anyone. Wants to know who she was referred to.  I see the order was placed but not sure who it was dropped to . Informed son that I would send message to [place]. Agreed.

## 2019-05-22 NOTE — Telephone Encounter (Signed)
I did neglect to enter the order.  My apologies.  It's in now.  Thanks.

## 2019-05-22 NOTE — Telephone Encounter (Signed)
Gmorning, I'll be glad to send out referral for [facility], but I don't see where 1 has been released yet.  Will send message over to Dr Mariea Clonts to advise  Thanks, Lattie Haw

## 2019-05-23 ENCOUNTER — Other Ambulatory Visit: Payer: Self-pay | Admitting: Family

## 2019-05-23 ENCOUNTER — Other Ambulatory Visit: Payer: Self-pay | Admitting: Internal Medicine

## 2019-05-23 DIAGNOSIS — F5101 Primary insomnia: Secondary | ICD-10-CM

## 2019-05-23 DIAGNOSIS — Z932 Ileostomy status: Secondary | ICD-10-CM

## 2019-06-05 ENCOUNTER — Ambulatory Visit (INDEPENDENT_AMBULATORY_CARE_PROVIDER_SITE_OTHER): Payer: Medicare Other | Admitting: Internal Medicine

## 2019-06-05 ENCOUNTER — Encounter: Payer: Self-pay | Admitting: Internal Medicine

## 2019-06-05 ENCOUNTER — Other Ambulatory Visit: Payer: Self-pay

## 2019-06-05 VITALS — BP 110/78 | HR 96 | Temp 97.5°F | Resp 16 | Ht 59.0 in | Wt 123.0 lb

## 2019-06-05 DIAGNOSIS — K529 Noninfective gastroenteritis and colitis, unspecified: Secondary | ICD-10-CM

## 2019-06-05 DIAGNOSIS — B353 Tinea pedis: Secondary | ICD-10-CM

## 2019-06-05 DIAGNOSIS — E1121 Type 2 diabetes mellitus with diabetic nephropathy: Secondary | ICD-10-CM | POA: Diagnosis not present

## 2019-06-05 DIAGNOSIS — R1032 Left lower quadrant pain: Secondary | ICD-10-CM

## 2019-06-05 DIAGNOSIS — N1831 Chronic kidney disease, stage 3a: Secondary | ICD-10-CM

## 2019-06-05 DIAGNOSIS — E1122 Type 2 diabetes mellitus with diabetic chronic kidney disease: Secondary | ICD-10-CM

## 2019-06-05 DIAGNOSIS — R110 Nausea: Secondary | ICD-10-CM | POA: Diagnosis not present

## 2019-06-05 DIAGNOSIS — R300 Dysuria: Secondary | ICD-10-CM

## 2019-06-05 DIAGNOSIS — Z932 Ileostomy status: Secondary | ICD-10-CM | POA: Diagnosis not present

## 2019-06-05 MED ORDER — ONDANSETRON 4 MG PO TBDP: 4.0000 mg | ORAL_TABLET | Freq: Three times a day (TID) | ORAL | 0 refills | Status: DC | PRN

## 2019-06-05 NOTE — Progress Notes (Signed)
Location:  [facility] clinic Provider: Mandela Bello L. Mariea Clonts, D.O., C.M.D.  Code Status: DNR Goals of Care:  Advanced Directives 06/05/2019  Does Patient Have a Medical Advance Directive? Yes  Type of Paramedic of [place];Out of facility DNR (pink MOST or yellow form)  Does patient want to make changes to medical advance directive? No - Patient declined  Copy of [place] in Chart? Yes - validated most recent copy scanned in chart (See row information)     Chief Complaint  Patient presents with  . Acute Visit    GI Distress    HPI: Patient is a 86 y.o. female seen today for an acute visit for nausea.  She began feeling ill last week and had diarrhea.  Other family had similar symptoms felt to be viral and they, too, had nausea for about 3 days.  Her nausea, however, has persisted and she's had quite a bit of burping and flatulence.  She has had very little to eat for almost a week now, but has been drinking well according to Jude, her son.  Her ostomy is draining dark loose stool b/c she has not been using her metamucil (as not eating hardly at all).  Intake has primarily been of small pieces of biscuit or cornbread only.  Not a good homecooked meal like normally she'd have.  She did try tums and gas-x yesterday with slight benefit.  Jude is wondering if she has wound up with an UTI now b/c her intake has been poor.  She's also been unsteadier than usual and I noticed a slight bit of confusion vs baseline today.  Past Medical History:  Diagnosis Date  . Allergic rhinitis    Per Records from [place]   . Anemia    Per Records from [place]   . Arthralgia of multiple sites    Per Records from [place]   . B12 deficiency    Per Records from [place]   . Baker's cyst    Per Records from [place]   . Bladder cancer ([place])   . CKD (chronic kidney disease) stage 3, GFR 30-59 ml/min    Per Records from [place]   . Dehydration    Per Records from [place]   . Dextroscoliosis    Per Records from [place]   . Diabetes ([place])   . Diverticulitis    Per Records from [place]   . Diverticulosis   . Fecal occult blood test positive    Per Records from [place]   . Gout    left foot  . 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[place])    Per Records from [place]   . Hypertension    Per Records from [place]   . Hypocalcemia    Per Records from [place]   . Hypomagnesemia    Per Records from [place]   . Iron deficiency    Per Records from [place]   . Large bowel obstruction [facility])    Per Records from [place]   . Mixed dyslipidemia    Per Records from [place]   . Myopathy    Per Records from [place]   . Onychomycosis    Per Records from [place]   . Peripheral neuropathy    Per Records from [place]   . Recurrent UTI   . Vitamin D deficiency    Per Records from [place]     Past Surgical History:  Procedure Laterality Date  . BLADDER SUSPENSION     Per Records from [place]   .  CATARACT EXTRACTION, BILATERAL    . CHOLECYSTECTOMY  1978  . hysterectomy  1966  . ILEOSTOMY  2017  . KYPHOPLASTY  2019  . MASTECTOMY Right 1998  . MASTECTOMY Left 2011  . TRANSURETHRAL RESECTION OF BLADDER TUMOR      Allergies  Allergen Reactions  . Codeine Nausea Only  . Flexeril [Cyclobenzaprine]     Per Records from [place]     Outpatient Encounter Medications as of 06/05/2019  Medication Sig  . acetaminophen (TYLENOL) 325 MG tablet Take 650 mg by mouth as needed.  Marland Kitchen alendronate (FOSAMAX) 70 MG tablet TAKE 1 TABLET BY MOUTH ONCE A WEEK WITH A FULL GLASS OF WATER ON AN EMPTY STOMACH  . ascorbic acid (VITAMIN C) 500 MG tablet TAKE 1 TABLET BY MOUTH EVERY DAY  . carbamide peroxide (DEBROX) 6.5 % OTIC solution Place 5 drops into both ears at bedtime as needed (x 1 week).  . D3 SUPER STRENGTH 50 MCG (2000 UT) CAPS TAKE 1 CAPSULE BY MOUTH EVERY DAY  . diclofenac  (FLECTOR) 1.3 % PTCH Place 1 patch onto the skin 2 (two) times daily.  . diphenoxylate-atropine (LOMOTIL) 2.5-0.025 MG tablet TAKE 1 TABLET BY MOUTH EVERY MORNING, 1 TABLET AT LUNCH AND 1 TABLET AT DINNER  . iron polysaccharides (FERREX 150) 150 MG capsule Take 1 capsule by mouth daily.  Marland Kitchen loperamide (IMODIUM) 2 MG capsule TAKE 1 CAPSULE BY MOUTH EVERY MORNING, 1 CAPSULE AT LUNCH AND 1 CAPSULE AT DINNER  . ondansetron (ZOFRAN-ODT) 4 MG disintegrating tablet Take 1 tablet (4 mg total) by mouth every 8 (eight) hours as needed for nausea or vomiting.  . pioglitazone (ACTOS) 15 MG tablet TAKE 1 TABLET BY MOUTH EVERY DAY  . psyllium (METAMUCIL) 58.6 % packet Take 1 packet by mouth at bedtime.  . simethicone (MYLICON) 0000000 MG chewable tablet Chew 125 mg by mouth as needed for flatulence. GasX  . sodium bicarbonate 650 MG tablet TAKE 1 TABLET BY MOUTH 2 TIMES DAILY  . traMADol (ULTRAM) 50 MG tablet TAKE 1 TABLET BY MOUTH EVERY TWELVE HOURS AS NEEDED FOR SEVERE PAIN  . zolpidem (AMBIEN) 5 MG tablet TAKE 1 TABLET BY MOUTH AT BEDTIME AS NEEDED FOR SLEEP  . [DISCONTINUED] POLY-IRON 150 150 MG capsule TAKE 1 CAPSULE BY MOUTH EVERY DAY   No facility-administered encounter medications on file as of 06/05/2019.    Review of Systems:  Review of Systems  Constitutional: Positive for malaise/fatigue. Negative for chills and fever.  HENT: Negative for congestion and sore throat.   Respiratory: Negative for cough and shortness of breath.   Cardiovascular: Negative for chest pain and palpitations.       Mild swelling left foot with color change  Gastrointestinal: Positive for abdominal pain, diarrhea and nausea. Negative for blood in stool, constipation, melena and vomiting.  Genitourinary: Negative for flank pain, frequency and urgency.  Musculoskeletal: Negative for falls and joint pain.  Skin: Positive for rash. Negative for itching.       Left foot    Health Maintenance  Topic Date Due  . TETANUS/TDAP   Never done  . DEXA SCAN  Never done  . OPHTHALMOLOGY EXAM  07/22/2019  . FOOT EXAM  09/26/2019  . URINE MICROALBUMIN  09/26/2019  . HEMOGLOBIN A1C  11/02/2019  . INFLUENZA VACCINE  Completed  . PNA vac Low Risk Adult  Completed    Physical Exam: Vitals:   06/05/19 1254  BP: 110/78  Pulse: 96  Resp: 16  Temp: (!)  97.5 F (36.4 C)  SpO2: 96%  Weight: 123 lb (55.8 kg)  Height: 4\' 11"  (1.499 m)   Body mass index is 24.84 kg/m. Physical Exam Vitals reviewed.  Constitutional:      General: She is not in acute distress.    Appearance: Normal appearance. She is not ill-appearing or toxic-appearing.  Cardiovascular:     Rate and Rhythm: Normal rate and regular rhythm.  Pulmonary:     Effort: Pulmonary effort is normal.     Breath sounds: Normal breath sounds. No wheezing, rhonchi or rales.  Abdominal:     General: There is no distension.     Palpations: There is no mass.     Tenderness: There is no abdominal tenderness. There is no right CVA tenderness, left CVA tenderness, guarding or rebound.     Comments: Hyperactive bowel sounds, no tenderness  Skin:    Findings: Rash present.     Comments: Macular rash of left greater than right foot, fungal toenails left greater than right, as well; dorsalis pedis pulses bounding, very slight left foot swelling and redness of toes  Neurological:     General: No focal deficit present.     Mental Status: She is alert and oriented to person, place, and time.  Psychiatric:        Mood and Affect: Mood normal.     Labs reviewed: Basic Metabolic Panel: Recent Labs    09/26/18 0914 05/05/19 0807  NA 140 139  K 4.9 4.6  CL 110 105  CO2 23 23  GLUCOSE 118 132*  BUN 26* 21  CREATININE 1.66* 1.51*  CALCIUM 9.3 9.2   Liver Function Tests: Recent Labs    05/05/19 0807  AST 13  ALT 8  BILITOT 0.4  PROT 6.5   No results for input(s): LIPASE, AMYLASE in the last 8760 hours. No results for input(s): AMMONIA in the last 8760  hours. CBC: Recent Labs    09/26/18 0914 05/05/19 0807  WBC 4.0 5.7  NEUTROABS 2,104 3,454  HGB 10.3* 9.7*  HCT 32.0* 29.0*  MCV 102.6* 99.7  PLT 199 252   Lipid Panel: No results for input(s): CHOL, HDL, LDLCALC, TRIG, CHOLHDL, LDLDIRECT in the last 8760 hours. Lab Results  Component Value Date   HGBA1C 6.4 (H) 05/05/2019    Procedures since last visit: No results found.  Assessment/Plan 1. Nausea - seems she is still having some aftermath of the gastroenteritis she and family had, but will also r/o pancreatitis and UTI due to prior presentations similar with UTIs (and decreased intake could have led to this) - CBC with Differential/Platelet - Amylase - Lipase - Hepatic function panel - Basic metabolic panel - Urinalysis, Routine w reflex microscopic - Urine Culture  2. Gastroenteritis - residual nausea, push fluids, encourage gradual increased intake as allowed -zofran refilled, simethicone scheduled for two days, then prn - Basic metabolic panel  3. Ileostomy status ([place]) -has small hernia of ileostomy  -has bowel sounds and flatus and w/o pain so not obstructed  4. Type 2 diabetes mellitus with stage 3a chronic kidney disease, without long-term current use of insulin ([place]) -taking sprite and things that are going to increase glucose due to gastroenteritis -monitor (may affect next hba1c, but at her age, I'm happy she's taking any po)  5. Dysuria - intake poor so r/o infection from that - Urinalysis, Routine w reflex microscopic - Urine Culture  6. Athlete's foot, left -cont epsom salts soaks or bathing feet in some  fashion daily and then apply otc athlete's foot cream to help cure infection  Labs/tests ordered:   Lab Orders     Urine Culture     CBC with Differential/Platelet     Amylase     Lipase     Hepatic function panel     Basic metabolic panel     Urinalysis, Routine w reflex microscopic  Next appt:  prn  Madoline Bhatt L. Carah Barrientes,  D.O. [facility] [address], [place] [address] Cell Phone (Mon-Fri 8am-5pm):  [phone] On Call:  [phone] & follow prompts after 5pm & weekends Office Phone:  [phone] Office Fax:  [phone]

## 2019-06-05 NOTE — Patient Instructions (Addendum)
Pick up over the counter athlete's foot cream to apply daily after bathing or epsom salts soaks.    Use simethicone 2 tablets up to three times a day routinely for 2 days, then as needed.  Also, pick up some more zofran that I've sent to the pharmacy for your nausea.

## 2019-06-07 LAB — HEPATIC FUNCTION PANEL
AG Ratio: 1.4 (calc) (ref 1.0–2.5)
ALT: 7 U/L (ref 6–29)
AST: 12 U/L (ref 10–35)
Albumin: 3.9 g/dL (ref 3.6–5.1)
Alkaline phosphatase (APISO): 50 U/L (ref 37–153)
Bilirubin, Direct: 0.1 mg/dL (ref 0.0–0.2)
Globulin: 2.7 g/dL (calc) (ref 1.9–3.7)
Indirect Bilirubin: 0.3 mg/dL (calc) (ref 0.2–1.2)
Total Bilirubin: 0.4 mg/dL (ref 0.2–1.2)
Total Protein: 6.6 g/dL (ref 6.1–8.1)

## 2019-06-07 LAB — URINALYSIS, ROUTINE W REFLEX MICROSCOPIC
Bilirubin Urine: NEGATIVE
Glucose, UA: NEGATIVE
Hgb urine dipstick: NEGATIVE
Nitrite: NEGATIVE
Specific Gravity, Urine: 1.019 (ref 1.001–1.03)
pH: <=5 (ref 5.0–8.0)

## 2019-06-07 LAB — BASIC METABOLIC PANEL
BUN/Creatinine Ratio: 14 (calc) (ref 6–22)
BUN: 19 mg/dL (ref 7–25)
CO2: 26 mmol/L (ref 20–32)
Calcium: 9.5 mg/dL (ref 8.6–10.4)
Chloride: 101 mmol/L (ref 98–110)
Creat: 1.4 mg/dL — ABNORMAL HIGH (ref 0.60–0.88)
Glucose, Bld: 144 mg/dL — ABNORMAL HIGH (ref 65–139)
Potassium: 4.5 mmol/L (ref 3.5–5.3)
Sodium: 137 mmol/L (ref 135–146)

## 2019-06-07 LAB — CBC WITH DIFFERENTIAL/PLATELET
Absolute Monocytes: 460 cells/uL (ref 200–950)
Basophils Absolute: 30 cells/uL (ref 0–200)
Basophils Relative: 0.5 %
Eosinophils Absolute: 30 cells/uL (ref 15–500)
Eosinophils Relative: 0.5 %
HCT: 31.8 % — ABNORMAL LOW (ref 35.0–45.0)
Hemoglobin: 10.2 g/dL — ABNORMAL LOW (ref 11.7–15.5)
Lymphs Abs: 1794 cells/uL (ref 850–3900)
MCH: 31.8 pg (ref 27.0–33.0)
MCHC: 32.1 g/dL (ref 32.0–36.0)
MCV: 99.1 fL (ref 80.0–100.0)
MPV: 9.6 fL (ref 7.5–12.5)
Monocytes Relative: 7.8 %
Neutro Abs: 3587 cells/uL (ref 1500–7800)
Neutrophils Relative %: 60.8 %
Platelets: 210 10*3/uL (ref 140–400)
RBC: 3.21 10*6/uL — ABNORMAL LOW (ref 3.80–5.10)
RDW: 12 % (ref 11.0–15.0)
Total Lymphocyte: 30.4 %
WBC: 5.9 10*3/uL (ref 3.8–10.8)

## 2019-06-07 LAB — LIPASE: Lipase: 19 U/L (ref 7–60)

## 2019-06-07 LAB — URINE CULTURE
MICRO NUMBER:: 10304776
Result:: NO GROWTH
SPECIMEN QUALITY:: ADEQUATE

## 2019-06-07 LAB — AMYLASE: Amylase: 39 U/L (ref 21–101)

## 2019-06-07 NOTE — Progress Notes (Signed)
No UTI--no growth on urine culture.

## 2019-06-19 ENCOUNTER — Other Ambulatory Visit: Payer: Self-pay | Admitting: Internal Medicine

## 2019-06-19 DIAGNOSIS — D508 Other iron deficiency anemias: Secondary | ICD-10-CM

## 2019-08-09 ENCOUNTER — Encounter: Payer: Self-pay | Admitting: Internal Medicine

## 2019-09-19 ENCOUNTER — Other Ambulatory Visit: Payer: Self-pay

## 2019-09-19 DIAGNOSIS — E1122 Type 2 diabetes mellitus with diabetic chronic kidney disease: Secondary | ICD-10-CM

## 2019-09-19 DIAGNOSIS — F5101 Primary insomnia: Secondary | ICD-10-CM

## 2019-09-19 DIAGNOSIS — Z932 Ileostomy status: Secondary | ICD-10-CM

## 2019-09-19 DIAGNOSIS — M810 Age-related osteoporosis without current pathological fracture: Secondary | ICD-10-CM

## 2019-09-19 DIAGNOSIS — D508 Other iron deficiency anemias: Secondary | ICD-10-CM

## 2019-09-19 MED ORDER — DIPHENOXYLATE-ATROPINE 2.5-0.025 MG PO TABS: ORAL_TABLET | ORAL | 5 refills | Status: DC

## 2019-09-19 MED ORDER — ALENDRONATE SODIUM 70 MG PO TABS: ORAL_TABLET | ORAL | 5 refills | Status: DC

## 2019-09-19 MED ORDER — ASCORBIC ACID 500 MG PO TABS: 500.0000 mg | ORAL_TABLET | Freq: Every day | ORAL | 1 refills | Status: DC

## 2019-09-19 MED ORDER — POLYSACCHARIDE IRON COMPLEX 150 MG PO CAPS: ORAL_CAPSULE | ORAL | 1 refills | Status: DC

## 2019-09-19 MED ORDER — VITAMIN D 50 MCG (2000 UT) PO CAPS: 1.0000 | ORAL_CAPSULE | Freq: Every day | ORAL | 0 refills | Status: DC

## 2019-09-19 MED ORDER — SODIUM BICARBONATE 650 MG PO TABS: 650.0000 mg | ORAL_TABLET | Freq: Two times a day (BID) | ORAL | 1 refills | Status: DC

## 2019-09-19 MED ORDER — LOPERAMIDE HCL 2 MG PO CAPS: ORAL_CAPSULE | ORAL | 5 refills | Status: DC

## 2019-09-19 MED ORDER — ZOLPIDEM TARTRATE 5 MG PO TABS: 5.0000 mg | ORAL_TABLET | Freq: Every evening | ORAL | 1 refills | Status: DC | PRN

## 2019-09-19 MED ORDER — ZOLPIDEM TARTRATE 5 MG PO TABS: 5.0000 mg | ORAL_TABLET | Freq: Every evening | ORAL | 3 refills | Status: DC | PRN

## 2019-09-19 MED ORDER — PIOGLITAZONE HCL 15 MG PO TABS: 15.0000 mg | ORAL_TABLET | Freq: Every day | ORAL | 1 refills | Status: DC

## 2019-09-19 NOTE — Telephone Encounter (Signed)
Rxs updated to normal and signed.

## 2019-09-19 NOTE — Telephone Encounter (Signed)
You had sent these prescriptions earlier but the birth date was wrong in our system and they were kicked back I have sent the others but I can't send these

## 2019-11-23 ENCOUNTER — Other Ambulatory Visit: Payer: Self-pay | Admitting: Internal Medicine

## 2019-11-23 MED ORDER — CARBAMIDE PEROXIDE 6.5 % OT SOLN: 5.0000 [drp] | Freq: Every evening | OTIC | 0 refills | Status: DC | PRN

## 2019-11-27 ENCOUNTER — Ambulatory Visit (INDEPENDENT_AMBULATORY_CARE_PROVIDER_SITE_OTHER): Payer: Medicare Other | Admitting: Internal Medicine

## 2019-11-27 ENCOUNTER — Other Ambulatory Visit: Payer: Self-pay

## 2019-11-27 ENCOUNTER — Encounter: Payer: Self-pay | Admitting: Internal Medicine

## 2019-11-27 VITALS — BP 110/78 | HR 68 | Temp 97.3°F | Wt 117.8 lb

## 2019-11-27 DIAGNOSIS — N1831 Chronic kidney disease, stage 3a: Secondary | ICD-10-CM

## 2019-11-27 DIAGNOSIS — M545 Low back pain, unspecified: Secondary | ICD-10-CM

## 2019-11-27 DIAGNOSIS — Z932 Ileostomy status: Secondary | ICD-10-CM | POA: Diagnosis not present

## 2019-11-27 DIAGNOSIS — D500 Iron deficiency anemia secondary to blood loss (chronic): Secondary | ICD-10-CM

## 2019-11-27 DIAGNOSIS — Z23 Encounter for immunization: Secondary | ICD-10-CM

## 2019-11-27 DIAGNOSIS — G8929 Other chronic pain: Secondary | ICD-10-CM

## 2019-11-27 DIAGNOSIS — M810 Age-related osteoporosis without current pathological fracture: Secondary | ICD-10-CM | POA: Diagnosis not present

## 2019-11-27 DIAGNOSIS — E1121 Type 2 diabetes mellitus with diabetic nephropathy: Secondary | ICD-10-CM | POA: Diagnosis not present

## 2019-11-27 DIAGNOSIS — E1122 Type 2 diabetes mellitus with diabetic chronic kidney disease: Secondary | ICD-10-CM

## 2019-11-27 NOTE — Patient Instructions (Addendum)
Rollator walker:  [address], [place], [place] [id number]

## 2019-11-27 NOTE — Progress Notes (Signed)
Location:  [facility] clinic Provider:  Azarian Starace L. Mariea Clonts, D.O., C.M.D.  Code Status: DNR Goals of Care:  Advanced Directives 11/27/2019  Does Patient Have a Medical Advance Directive? Yes  Type of Paramedic of [place];Out of facility DNR (pink MOST or yellow form)  Does patient want to make changes to medical advance directive? No - Patient declined  Copy of [place] in Chart? -     Chief Complaint  Patient presents with  . Medical Management of Chronic Issues    6 month follow up   . Health Maintenance    Influenza, eye exam, tetanus, foot exam, Urine microalbumin    HPI: Patient is a 86 y.o. female seen today for medical management of chronic diseases.    Due in 5/22 for eye exam as recommended by her ophthalmologist.  Will check feet today and urine micro.    Declined tdap.  Getting around fine.  No falls.  She is doing much better about using her cane not to fall.  There is a lot of carpet, not hard floor.    She just had her injection on Friday for her back--helping tremendously with her pain.  CBGs up just that day and then better.  No other parts hurt.  Continues on tramadol but Dr. Nelva Bush renews that.  Takes almost every night.  May use tylenol instead.    Still using ambien nightly for sleep.    Bowel thickness is good with her metamucil 1-2 times per day.  She regulates this.  No nausea.  On iron supplement.  Last hgb 3/29 10.2.    Weight appears down, but sounds like the 123 lbs may not have been accurate.     Past Medical History:  Diagnosis Date  . Allergic rhinitis    Per Records from [place]   . Anemia    Per Records from [place]   . Arthralgia of multiple sites    Per Records from [place]   . B12 deficiency    Per Records from [place]   . Baker's cyst    Per Records from [place]   . Bladder cancer ([place])   . CKD (chronic kidney disease) stage 3, GFR 30-59 ml/min    Per Records from  [place]   . Dehydration    Per Records from [place]   . Dextroscoliosis    Per Records from [place]   . Diabetes ([place])   . Diverticulitis    Per Records from [place]   . Diverticulosis   . Fecal occult blood test positive    Per Records from [place]   . Gout    left foot  . 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 Hypertension    Per Records from [place]   . Hypocalcemia    Per Records from [place]   . Hypomagnesemia    Per Records from [place]   . Iron deficiency    Per Records from [place]   . Large bowel obstruction [facility])    Per Records from [place]   . Mixed dyslipidemia    Per Records from [place]   . Myopathy    Per Records from [place]   . Onychomycosis    Per Records from [place]   . Peripheral neuropathy    Per Records from [place]   . Recurrent UTI   . Vitamin D deficiency    Per Records from [place]     Past Surgical History:  Procedure Laterality Date  . BLADDER SUSPENSION     Per Records from [place]   . CATARACT EXTRACTION, BILATERAL    . CHOLECYSTECTOMY  1978  . hysterectomy  1966  . ILEOSTOMY  2017  . KYPHOPLASTY  2019  . MASTECTOMY Right 1998  . MASTECTOMY Left 2011  . TRANSURETHRAL RESECTION OF BLADDER TUMOR      Allergies  Allergen Reactions  . Codeine Nausea Only  . Flexeril [Cyclobenzaprine]     Per Records from [place]     Outpatient Encounter Medications as of 11/27/2019  Medication Sig  . acetaminophen (TYLENOL) 325 MG tablet Take 650 mg by mouth as needed.  Marland Kitchen alendronate (FOSAMAX) 70 MG tablet TAKE 1 TABLET BY MOUTH ONCE A WEEK WITH A FULL GLASS OF WATER ON AN EMPTY STOMACH  . ascorbic acid (VITAMIN C) 500 MG tablet Take 1 tablet (500 mg total) by mouth daily.  . carbamide peroxide (DEBROX) 6.5 % OTIC solution Place 5 drops into both ears at bedtime as needed (x 1 week).  . diphenoxylate-atropine (LOMOTIL) 2.5-0.025 MG  tablet TAKE 1 TABLET BY MOUTH EVERY MORNING, 1 TABLET AT LUNCH AND 1 TABLET AT DINNER  . iron polysaccharides (FERREX 150) 150 MG capsule TAKE 1 CAPSULE BY MOUTH EVERY DAY  . loperamide (IMODIUM) 2 MG capsule Take 1 capsule by mouth every morning, 1 capsule at lunch, 1 capsule at dinner  . ondansetron (ZOFRAN-ODT) 4 MG disintegrating tablet Take 1 tablet (4 mg total) by mouth every 8 (eight) hours as needed for nausea or vomiting.  . pioglitazone (ACTOS) 15 MG tablet Take 1 tablet (15 mg total) by mouth daily.  . psyllium (METAMUCIL) 58.6 % packet Take 1 packet by mouth at bedtime.  . simethicone (MYLICON) 481 MG chewable tablet Chew 125 mg by mouth as needed for flatulence. GasX  . sodium bicarbonate 650 MG tablet Take 1 tablet (650 mg total) by mouth 2 (two) times daily.  . traMADol (ULTRAM) 50 MG tablet TAKE 1 TABLET BY MOUTH EVERY TWELVE HOURS AS NEEDED FOR SEVERE PAIN  . zolpidem (AMBIEN) 5 MG tablet Take 1 tablet (5 mg total) by mouth at bedtime as needed. for sleep  . D3 SUPER STRENGTH 50 MCG (2000 UT) CAPS TAKE 1 CAPSULE BY MOUTH EVERY DAY  . [DISCONTINUED] Cholecalciferol (VITAMIN D) 50 MCG (2000 UT) CAPS Take 1 capsule (2,000 Units total) by mouth daily.  . [DISCONTINUED] diclofenac (FLECTOR) 1.3 % PTCH Place 1 patch onto the skin 2 (two) times daily.   No facility-administered encounter medications on file as of 11/27/2019.    Review of Systems:  Review of Systems  Constitutional: Negative for chills, fever and malaise/fatigue.  HENT: Positive for hearing loss.        Requested check for cerumen  Eyes: Negative for blurred vision.  Respiratory: Negative for cough and shortness of breath.   Cardiovascular: Negative for chest pain and palpitations.  Gastrointestinal: Negative for abdominal pain, blood in stool and melena.       Stool regulated with her metamucil and diarrhea meds  Genitourinary: Negative for dysuria.  Musculoskeletal: Negative for falls and joint pain.  Skin:  Negative for itching and rash.  Neurological: Negative for dizziness and loss of consciousness.       Using cane here; uses 2 wheeled walker at home, but it gets stuck on the carpet--requesting alternative  Endo/Heme/Allergies: Bruises/bleeds easily.  Psychiatric/Behavioral: Negative for depression and memory loss. The patient is not nervous/anxious and does not have insomnia.  Health Maintenance  Topic Date Due  . URINE MICROALBUMIN  09/26/2019  . HEMOGLOBIN A1C  11/02/2019  . OPHTHALMOLOGY EXAM  07/07/2020 (Originally 07/22/2019)  . DEXA SCAN  11/26/2020 (Originally 03/04/1992)  . TETANUS/TDAP  11/26/2020 (Originally [birth year]04/1946)  . FOOT EXAM  11/26/2020  . INFLUENZA VACCINE  Completed  . COVID-19 Vaccine  Completed  . PNA vac Low Risk Adult  Completed    Physical Exam: Vitals:   11/27/19 1126  BP: 110/78  Pulse: 68  Temp: (!) 97.3 F (36.3 C)  SpO2: 98%  Weight: 117 lb 12.8 oz (53.4 kg)   Body mass index is 23.79 kg/m. Physical Exam Vitals reviewed.  Constitutional:      Appearance: Normal appearance.  HENT:     Head: Normocephalic and atraumatic.  Cardiovascular:     Rate and Rhythm: Normal rate and regular rhythm.     Pulses: Normal pulses.     Heart sounds: Normal heart sounds.  Pulmonary:     Effort: Pulmonary effort is normal.     Breath sounds: Normal breath sounds.  Abdominal:     General: Bowel sounds are normal.     Comments: Ostomy in place  Musculoskeletal:        General: Normal range of motion.     Right lower leg: No edema.     Left lower leg: No edema.     Comments: Has some fatty deposition around ankles, but no pitting edema; Diabetic foot exam was performed with the following findings:   No deformities, ulcerations, or other skin breakdown Normal sensation of 10g monofilament Left foot with ecchymoses on dorsum proximal to toes Intact posterior tibialis and dorsalis pedis pulses Thickened fungal toenails, dry skin     Skin:     General: Skin is warm and dry.  Neurological:     General: No focal deficit present.     Mental Status: She is alert and oriented to person, place, and time. Mental status is at baseline.     Cranial Nerves: No cranial nerve deficit.     Gait: Gait abnormal.     Comments: Slow gait, using cane  Psychiatric:        Mood and Affect: Mood normal.        Behavior: Behavior normal.        Thought Content: Thought content normal.        Judgment: Judgment normal.     Labs reviewed: Basic Metabolic Panel: Recent Labs    05/05/19 0807 06/05/19 1338  NA 139 137  K 4.6 4.5  CL 105 101  CO2 23 26  GLUCOSE 132* 144*  BUN 21 19  CREATININE 1.51* 1.40*  CALCIUM 9.2 9.5   Liver Function Tests: Recent Labs    05/05/19 0807 06/05/19 1338  AST 13 12  ALT 8 7  BILITOT 0.4 0.4  PROT 6.5 6.6   Recent Labs    06/05/19 1338  LIPASE 19  AMYLASE 39   No results for input(s): AMMONIA in the last 8760 hours. CBC: Recent Labs    05/05/19 0807 06/05/19 1338  WBC 5.7 5.9  NEUTROABS 3,454 3,587  HGB 9.7* 10.2*  HCT 29.0* 31.8*  MCV 99.7 99.1  PLT 252 210   Lipid Panel: No results for input(s): CHOL, HDL, LDLCALC, TRIG, CHOLHDL, LDLDIRECT in the last 8760 hours. Lab Results  Component Value Date   HGBA1C 6.4 (H) 05/05/2019    Assessment/Plan 1. Need for influenza vaccination - Flu Vaccine QUAD High Dose(Fluad)  given  2. Type 2 diabetes mellitus with stage 3a chronic kidney disease, without long-term current use of insulin (HCC) - continues on actos with healthy diet Lab Results  Component Value Date   HGBA1C 6.4 (H) 05/05/2019  -no known issues with hypoglycemia - Microalbumin / creatinine urine ratio today - COMPLETE METABOLIC PANEL WITH GFR; Future - Hemoglobin A1c; Future  3. Ileostomy status ([place]) -no recent changes or concerns, continues her prn simethicone, 1-2 doses of metamucil daily, lomotil tid, imodium tid  4. Chronic midline low back pain without  sciatica -using tramadol just about nightly but prescribed by ortho -just had back injection and starting to get relief -uses cane for support and balance when out, but needs a rollator walker with seat/4 wheels for home b/c her current one has just 2 wheels and sticks on the carpet--handwritten Rx provided to pt's son to get at [facility] [address]  5. Senile osteoporosis - continue on D3 2000 units daily and fosamax therapy for this - CBC with Differential/Platelet; Future - COMPLETE METABOLIC PANEL WITH GFR; Future  6. Stage 3a chronic kidney disease - Avoid nephrotoxic agents like nsaids, dose adjust renally excreted meds, hydrate. - CBC with Differential/Platelet; Future  7. Iron deficiency anemia due to chronic blood loss - h/h improved, continue to monitor and take iron daily - CBC with Differential/Platelet; Future - Iron, TIBC and Ferritin Panel; Future  Labs/tests ordered:   Orders Placed This Encounter  Procedures  . Flu Vaccine QUAD High Dose(Fluad)  . Microalbumin / creatinine urine ratio  . CBC with Differential/Platelet    Standing Status:   Future    Standing Expiration Date:   11/26/2020  . COMPLETE METABOLIC PANEL WITH GFR    Standing Status:   Future    Standing Expiration Date:   11/26/2020  . Hemoglobin A1c    Standing Status:   Future    Standing Expiration Date:   11/26/2020    Order Specific Question:   Release to patient    Answer:   Immediate  . Iron, TIBC and Ferritin Panel    Standing Status:   Future    Standing Expiration Date:   11/26/2020    Next appt:  6 mos med mgt with fasting labs same day   Hazen Brumett L. Zayyan Mullen, D.O. [facility] [address], [place] [address] Cell Phone (Mon-Fri 8am-5pm):  [phone] On Call:  [phone] & follow prompts after 5pm & weekends Office Phone:  [phone] Office Fax:  [phone]

## 2019-11-28 LAB — MICROALBUMIN / CREATININE URINE RATIO
Creatinine, Urine: 58 mg/dL (ref 20–275)
Microalb Creat Ratio: 16 mcg/mg creat (ref <30)
Microalb, Ur: 0.9 mg/dL

## 2019-12-12 ENCOUNTER — Ambulatory Visit: Payer: Medicare Other | Attending: Internal Medicine

## 2019-12-12 DIAGNOSIS — Z23 Encounter for immunization: Secondary | ICD-10-CM

## 2019-12-12 NOTE — Progress Notes (Signed)
   Covid-19 Vaccination Clinic  Name:  Cheryl Salas    MRN: [id number] DOB: 03/05/1925  12/12/2019  [name] was observed post Covid-19 immunization for 15 minutes without incident. She was provided with Vaccine Information Sheet and instruction to access the V-Safe system.   [name] was instructed to call 911 with any severe reactions post vaccine: Marland Kitchen Difficulty breathing  . Swelling of face and throat  . A fast heartbeat  . A bad rash all over body  . Dizziness and weakness

## 2019-12-18 ENCOUNTER — Other Ambulatory Visit: Payer: Self-pay | Admitting: Internal Medicine

## 2019-12-18 DIAGNOSIS — M810 Age-related osteoporosis without current pathological fracture: Secondary | ICD-10-CM

## 2020-01-29 ENCOUNTER — Other Ambulatory Visit: Payer: Self-pay | Admitting: Internal Medicine

## 2020-01-29 DIAGNOSIS — M810 Age-related osteoporosis without current pathological fracture: Secondary | ICD-10-CM

## 2020-01-29 DIAGNOSIS — E1122 Type 2 diabetes mellitus with diabetic chronic kidney disease: Secondary | ICD-10-CM

## 2020-01-31 ENCOUNTER — Other Ambulatory Visit: Payer: Self-pay | Admitting: Internal Medicine

## 2020-01-31 DIAGNOSIS — N183 Chronic kidney disease, stage 3 unspecified: Secondary | ICD-10-CM

## 2020-01-31 DIAGNOSIS — E1122 Type 2 diabetes mellitus with diabetic chronic kidney disease: Secondary | ICD-10-CM

## 2020-01-31 DIAGNOSIS — M810 Age-related osteoporosis without current pathological fracture: Secondary | ICD-10-CM

## 2020-03-26 ENCOUNTER — Other Ambulatory Visit: Payer: Self-pay | Admitting: Internal Medicine

## 2020-03-26 DIAGNOSIS — F5101 Primary insomnia: Secondary | ICD-10-CM

## 2020-03-26 DIAGNOSIS — Z932 Ileostomy status: Secondary | ICD-10-CM

## 2020-03-26 NOTE — Telephone Encounter (Signed)
Sent to Dr. Reed for approval  

## 2020-04-08 ENCOUNTER — Other Ambulatory Visit: Payer: Self-pay | Admitting: Internal Medicine

## 2020-04-08 DIAGNOSIS — M810 Age-related osteoporosis without current pathological fracture: Secondary | ICD-10-CM

## 2020-04-09 ENCOUNTER — Other Ambulatory Visit: Payer: Self-pay | Admitting: Internal Medicine

## 2020-04-09 DIAGNOSIS — M810 Age-related osteoporosis without current pathological fracture: Secondary | ICD-10-CM

## 2020-04-15 ENCOUNTER — Other Ambulatory Visit: Payer: Self-pay | Admitting: Internal Medicine

## 2020-04-15 DIAGNOSIS — M810 Age-related osteoporosis without current pathological fracture: Secondary | ICD-10-CM

## 2020-04-16 ENCOUNTER — Telehealth: Payer: Self-pay

## 2020-04-16 NOTE — Telephone Encounter (Signed)
[name] from [facility] states that patient has been getting medications Fosamax, and Lomtotil rejected. I confirmed that medication Fosamax was refilled 04/15/2020 and Lomotil was refilled 03/26/2020. [facility] states that they just received Fosamax but not Lomotil. Verbal order was given for Lomotil. 84 tablets to be taken 1 by mouth every morning, 1 tablet at lunch, and 1 tablet at dinner with 5 refills.

## 2020-04-29 ENCOUNTER — Encounter: Payer: Self-pay | Admitting: Internal Medicine

## 2020-05-06 ENCOUNTER — Other Ambulatory Visit: Payer: Self-pay | Admitting: Internal Medicine

## 2020-05-06 DIAGNOSIS — D508 Other iron deficiency anemias: Secondary | ICD-10-CM

## 2020-05-06 DIAGNOSIS — M810 Age-related osteoporosis without current pathological fracture: Secondary | ICD-10-CM

## 2020-05-13 ENCOUNTER — Other Ambulatory Visit: Payer: Self-pay

## 2020-05-13 ENCOUNTER — Ambulatory Visit (INDEPENDENT_AMBULATORY_CARE_PROVIDER_SITE_OTHER): Payer: Medicare Other | Admitting: Internal Medicine

## 2020-05-13 ENCOUNTER — Encounter: Payer: Self-pay | Admitting: Internal Medicine

## 2020-05-13 VITALS — BP 120/70 | HR 75 | Temp 97.5°F | Ht 59.0 in | Wt 128.6 lb

## 2020-05-13 DIAGNOSIS — R109 Unspecified abdominal pain: Secondary | ICD-10-CM

## 2020-05-13 DIAGNOSIS — N39498 Other specified urinary incontinence: Secondary | ICD-10-CM | POA: Diagnosis not present

## 2020-05-13 DIAGNOSIS — M791 Myalgia, unspecified site: Secondary | ICD-10-CM

## 2020-05-13 DIAGNOSIS — N1831 Chronic kidney disease, stage 3a: Secondary | ICD-10-CM

## 2020-05-13 DIAGNOSIS — Z932 Ileostomy status: Secondary | ICD-10-CM

## 2020-05-13 DIAGNOSIS — E1122 Type 2 diabetes mellitus with diabetic chronic kidney disease: Secondary | ICD-10-CM | POA: Diagnosis not present

## 2020-05-13 DIAGNOSIS — M545 Low back pain, unspecified: Secondary | ICD-10-CM

## 2020-05-13 DIAGNOSIS — M810 Age-related osteoporosis without current pathological fracture: Secondary | ICD-10-CM

## 2020-05-13 DIAGNOSIS — G8929 Other chronic pain: Secondary | ICD-10-CM

## 2020-05-13 DIAGNOSIS — D500 Iron deficiency anemia secondary to blood loss (chronic): Secondary | ICD-10-CM

## 2020-05-13 NOTE — Progress Notes (Signed)
Location:  [facility] clinic Provider:  Atsushi Yom L. Mariea Clonts, D.O., C.M.D.  Code Status: DNR Goals of Care:  Advanced Directives 05/13/2020  Does Patient Have a Medical Advance Directive? Yes  Type of Paramedic of [place];Out of facility DNR (pink MOST or yellow form)  Does patient want to make changes to medical advance directive? No - Patient declined  Copy of [place] in Chart? -  Pre-existing out of facility DNR order (yellow form or pink MOST form) Yellow form placed in chart (order not valid for inpatient use)     Chief Complaint  Patient presents with  . Medical Management of Chronic Issues    6 month follow up. Patient states that she had been more tired than usual. No energy and back ache.   . Health Maintenance    Hemoglobin A1C    HPI: Patient is a 87 y.o. female seen today for medical management of chronic diseases.    She's had 4-5 days of generalized soreness.  Whole body like she'd done heavy work, but almost better now.  Last night arms ached.  No fever.  Has her chronic backache.  No fall.   She remains quite good at hydration.  No mental fog.  Bladder leakage may be a little worse.  Has had to change her pads more often.    She has gained 10 lbs in the past year.    Right middle finger has starting locking up and using stent on it to keep it straight at night.  That's when it would trigger.  Has to massage and it comes back to where it's normal and loose.   Past Medical History:  Diagnosis Date  . Allergic rhinitis    Per Records from [place]   . Anemia    Per Records from [place]   . Arthralgia of multiple sites    Per Records from [place]   . B12 deficiency    Per Records from [place]   . Baker's cyst    Per Records from [place]   . Bladder cancer ([place])   . CKD (chronic kidney disease) stage 3, GFR 30-59 ml/min (HCC)    Per Records from [place]   . Dehydration    Per Records from  [place]   . Dextroscoliosis    Per Records from [place]   . Diabetes ([place])   . Diverticulitis    Per Records from [place]   . Diverticulosis   . Fecal occult blood test positive    Per Records from [place]   . Gout    left foot  . 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[place])    Per Records from [place]   . Hypertension    Per Records from [place]   . Hypocalcemia    Per Records from [place]   . Hypomagnesemia    Per Records from [place]   . Iron deficiency    Per Records from [place]   . Large bowel obstruction [facility])    Per Records from [place]   . Mixed dyslipidemia    Per Records from [place]   . Myopathy    Per Records from [place]   . Onychomycosis    Per Records from [place]   . Peripheral neuropathy    Per Records from [place]   . Recurrent UTI   . Vitamin D deficiency    Per Records from [place]     Past Surgical History:  Procedure Laterality Date  . BLADDER SUSPENSION  Per Records from [place]   . CATARACT EXTRACTION, BILATERAL    . CHOLECYSTECTOMY  1978  . hysterectomy  1966  . ILEOSTOMY  2017  . KYPHOPLASTY  2019  . MASTECTOMY Right 1998  . MASTECTOMY Left 2011  . TRANSURETHRAL RESECTION OF BLADDER TUMOR      Allergies  Allergen Reactions  . Codeine Nausea Only  . Flexeril [Cyclobenzaprine]     Per Records from [place]     Outpatient Encounter Medications as of 05/13/2020  Medication Sig  . acetaminophen (TYLENOL) 325 MG tablet Take 650 mg by mouth as needed.  Marland Kitchen alendronate (FOSAMAX) 70 MG tablet TAKE 1 TABLET BY MOUTH ONCE A WEEK WITH A FULL GLASS OF WATER ON AN EMPTY STOMACH  . ascorbic acid (VITAMIN C) 500 MG tablet TAKE 1 TABLET BY MOUTH DAILY  . carbamide peroxide (DEBROX) 6.5 % OTIC solution Place 5 drops into both ears at bedtime as needed (x 1 week).  . D3 HIGH POTENCY 50 MCG (2000 UT) CAPS TAKE 1 CAPSULE BY MOUTH EVERY DAY  . diphenoxylate-atropine (LOMOTIL)  2.5-0.025 MG tablet TAKE 1 TABLET BY MOUTH EVERY MORNING, 1 TABLET AT LUNCH, AND 1 TABLET AT dinner  . iron polysaccharides (FERREX 150) 150 MG capsule TAKE 1 CAPSULE BY MOUTH EVERY DAY  . loperamide (IMODIUM) 2 MG capsule TAKE 1 CAPSULE BY MOUTH EVERY MORNING, 1 CAPSULE AT LUNCH, AND 1 CAPSULE AT dinner  . ondansetron (ZOFRAN-ODT) 4 MG disintegrating tablet Take 1 tablet (4 mg total) by mouth every 8 (eight) hours as needed for nausea or vomiting.  . pioglitazone (ACTOS) 15 MG tablet TAKE 1 TABLET BY MOUTH EVERY DAY  . psyllium (METAMUCIL) 58.6 % packet Take 1 packet by mouth at bedtime.  . simethicone (MYLICON) 073 MG chewable tablet Chew 125 mg by mouth as needed for flatulence. GasX  . sodium bicarbonate 650 MG tablet TAKE 1 TABLET BY MOUTH 2 TIMES DAILY  . traMADol (ULTRAM) 50 MG tablet TAKE 1 TABLET BY MOUTH EVERY TWELVE HOURS AS NEEDED FOR SEVERE PAIN  . zolpidem (AMBIEN) 5 MG tablet TAKE 1 TABLET BY MOUTH AT BEDTIME AS NEEDED FOR SLEEP   No facility-administered encounter medications on file as of 05/13/2020.    Review of Systems:  Review of Systems  Constitutional: Negative for chills and fever.  HENT: Negative for congestion and sore throat.   Eyes: Negative for blurred vision.  Respiratory: Negative for cough and shortness of breath.   Cardiovascular: Negative for chest pain, palpitations and leg swelling.  Gastrointestinal: Negative for abdominal pain, blood in stool and melena.  Genitourinary: Negative for dysuria.       Increased incontinence  Musculoskeletal: Positive for back pain, joint pain and myalgias. Negative for falls.       Right shoulder  Neurological: Negative for dizziness and loss of consciousness.  Endo/Heme/Allergies: Bruises/bleeds easily.  Psychiatric/Behavioral: Negative for depression and memory loss (very minimal). The patient is not nervous/anxious and does not have insomnia.     Health Maintenance  Topic Date Due  . HEMOGLOBIN A1C  11/02/2019  .  OPHTHALMOLOGY EXAM  07/07/2020 (Originally 07/22/2019)  . DEXA SCAN  11/26/2020 (Originally 03/04/1992)  . TETANUS/TDAP  11/26/2020 (Originally 03/04/1946)  . FOOT EXAM  11/26/2020  . URINE MICROALBUMIN  11/26/2020  . INFLUENZA VACCINE  Completed  . COVID-19 Vaccine  Completed  . PNA vac Low Risk Adult  Completed  . HPV VACCINES  Aged Out    Physical Exam: Vitals:   05/13/20 7106  BP: 120/70  Pulse: 75  Temp: (!) 97.5 F (36.4 C)  SpO2: 97%  Weight: 128 lb 9.6 oz (58.3 kg)  Height: 4\' 11"  (1.499 m)   Body mass index is 25.97 kg/m. Physical Exam Vitals reviewed.  Constitutional:      Appearance: Normal appearance.  Cardiovascular:     Rate and Rhythm: Normal rate and regular rhythm.     Pulses: Normal pulses.     Heart sounds: Normal heart sounds.  Pulmonary:     Effort: Pulmonary effort is normal.     Breath sounds: Normal breath sounds. No wheezing, rhonchi or rales.  Abdominal:     General: Bowel sounds are normal.     Palpations: Abdomen is soft.     Tenderness: There is no abdominal tenderness. There is no right CVA tenderness, left CVA tenderness, guarding or rebound.     Comments: ostomy  Musculoskeletal:     Right lower leg: No edema.     Left lower leg: No edema.     Comments: Right shoulder decreased ROM  Skin:    General: Skin is warm and dry.  Neurological:     General: No focal deficit present.     Mental Status: She is alert and oriented to person, place, and time.     Comments: Walking with cane  Psychiatric:        Mood and Affect: Mood normal.        Behavior: Behavior normal.        Thought Content: Thought content normal.        Judgment: Judgment normal.     Labs reviewed: Basic Metabolic Panel: Recent Labs    06/05/19 1338  NA 137  K 4.5  CL 101  CO2 26  GLUCOSE 144*  BUN 19  CREATININE 1.40*  CALCIUM 9.5   Liver Function Tests: Recent Labs    06/05/19 1338  AST 12  ALT 7  BILITOT 0.4  PROT 6.6   Recent Labs     06/05/19 1338  LIPASE 19  AMYLASE 39   No results for input(s): AMMONIA in the last 8760 hours. CBC: Recent Labs    06/05/19 1338  WBC 5.9  NEUTROABS 3,587  HGB 10.2*  HCT 31.8*  MCV 99.1  PLT 210   Lipid Panel: No results for input(s): CHOL, HDL, LDLCALC, TRIG, CHOLHDL, LDLDIRECT in the last 8760 hours. Lab Results  Component Value Date   HGBA1C 6.4 (H) 05/05/2019    Procedures since last visit: No results found.  Assessment/Plan 1. Myalgia -r/o UTI - Urinalysis, Routine w reflex microscopic - Urine Culture  2. Flank pain - r/o UTI - Urinalysis, Routine w reflex microscopic - Urine Culture  3. Other urinary incontinence - worse than baseline in context of myalgias and back pain increase - Urinalysis, Routine w reflex microscopic - Urine Culture  4. Type 2 diabetes mellitus with stage 3a chronic kidney disease, without long-term current use of insulin ([place]) - f/u labs today -has gained 10 lbs this past year - Hemoglobin A1c - COMPLETE METABOLIC PANEL WITH GFR  5. Ileostomy status ([place]) -no issues with ostomy  6. Chronic midline low back pain without sciatica -cont tylenol and tramadol when needed  7. Stage 3a chronic kidney disease (HCC) -Avoid nephrotoxic agents like nsaids, dose adjust renally excreted meds, hydrate. - CBC with Differential/Platelet  8. Iron deficiency anemia due to chronic blood loss - f/u labs today - Iron, TIBC and Ferritin Panel - CBC with Differential/Platelet  9. Senile osteoporosis - cont fosamax,D3 - COMPLETE METABOLIC PANEL WITH GFR - CBC with Differential/Platelet    Labs/tests ordered:   Lab Orders     Urine Culture     Urinalysis, Routine w reflex microscopic And routine blood tests also done  Next appt:  4 mos med mgt  Tucker Steedley L. Doreather Hoxworth, D.O. [facility] [address], [place] [address] Cell Phone (Mon-Fri 8am-5pm):  [phone] On Call:  [phone]  & follow prompts after 5pm & weekends Office Phone:  [phone] Office Fax:  [phone]

## 2020-05-14 LAB — HEMOGLOBIN A1C
Hgb A1c MFr Bld: 7 % of total Hgb — ABNORMAL HIGH (ref <5.7)
Mean Plasma Glucose: 154 mg/dL
eAG (mmol/L): 8.5 mmol/L

## 2020-05-14 LAB — MICROSCOPIC MESSAGE

## 2020-05-14 LAB — URINE CULTURE
MICRO NUMBER:: 11616475
SPECIMEN QUALITY:: ADEQUATE

## 2020-05-14 LAB — CBC WITH DIFFERENTIAL/PLATELET
Absolute Monocytes: 468 cells/uL (ref 200–950)
Basophils Absolute: 78 cells/uL (ref 0–200)
Basophils Relative: 1.2 %
Eosinophils Absolute: 72 cells/uL (ref 15–500)
Eosinophils Relative: 1.1 %
HCT: 34.9 % — ABNORMAL LOW (ref 35.0–45.0)
Hemoglobin: 11.6 g/dL — ABNORMAL LOW (ref 11.7–15.5)
Lymphs Abs: 2594 cells/uL (ref 850–3900)
MCH: 32.2 pg (ref 27.0–33.0)
MCHC: 33.2 g/dL (ref 32.0–36.0)
MCV: 96.9 fL (ref 80.0–100.0)
MPV: 9.4 fL (ref 7.5–12.5)
Monocytes Relative: 7.2 %
Neutro Abs: 3289 cells/uL (ref 1500–7800)
Neutrophils Relative %: 50.6 %
Platelets: 227 10*3/uL (ref 140–400)
RBC: 3.6 10*6/uL — ABNORMAL LOW (ref 3.80–5.10)
RDW: 12.6 % (ref 11.0–15.0)
Total Lymphocyte: 39.9 %
WBC: 6.5 10*3/uL (ref 3.8–10.8)

## 2020-05-14 LAB — COMPLETE METABOLIC PANEL WITH GFR
AG Ratio: 1.8 (calc) (ref 1.0–2.5)
ALT: 9 U/L (ref 6–29)
AST: 15 U/L (ref 10–35)
Albumin: 4.4 g/dL (ref 3.6–5.1)
Alkaline phosphatase (APISO): 44 U/L (ref 37–153)
BUN/Creatinine Ratio: 18 (calc) (ref 6–22)
BUN: 27 mg/dL — ABNORMAL HIGH (ref 7–25)
CO2: 24 mmol/L (ref 20–32)
Calcium: 9.6 mg/dL (ref 8.6–10.4)
Chloride: 107 mmol/L (ref 98–110)
Creat: 1.49 mg/dL — ABNORMAL HIGH (ref 0.60–0.88)
GFR, Est African American: 35 mL/min/{1.73_m2} — ABNORMAL LOW (ref >=60)
GFR, Est Non African American: 30 mL/min/{1.73_m2} — ABNORMAL LOW (ref >=60)
Globulin: 2.5 g/dL (calc) (ref 1.9–3.7)
Glucose, Bld: 138 mg/dL — ABNORMAL HIGH (ref 65–99)
Potassium: 4.7 mmol/L (ref 3.5–5.3)
Sodium: 139 mmol/L (ref 135–146)
Total Bilirubin: 0.6 mg/dL (ref 0.2–1.2)
Total Protein: 6.9 g/dL (ref 6.1–8.1)

## 2020-05-14 LAB — URINALYSIS, ROUTINE W REFLEX MICROSCOPIC
Bilirubin Urine: NEGATIVE
Glucose, UA: NEGATIVE
Hgb urine dipstick: NEGATIVE
Ketones, ur: NEGATIVE
Nitrite: NEGATIVE
Specific Gravity, Urine: 1.017 (ref 1.001–1.03)
pH: <=5 (ref 5.0–8.0)

## 2020-05-14 LAB — IRON,TIBC AND FERRITIN PANEL
%SAT: 25 % (calc) (ref 16–45)
Ferritin: 145 ng/mL (ref 16–288)
Iron: 89 ug/dL (ref 45–160)
TIBC: 351 mcg/dL (calc) (ref 250–450)

## 2020-05-14 NOTE — Progress Notes (Signed)
Anemia has improved and iron panel in normal range at this time.  No changes Kidneys are stable.  Electrolytes are normal. Sugar average/"a1c" has trended up to 7 which is still fine at 93 because it means she's less likely to get hypoglycemic and fall from that UA has some white blood cells, but culture is pending--we'll wait to see if there is any growth

## 2020-05-20 ENCOUNTER — Ambulatory Visit: Payer: Medicare Other | Admitting: Internal Medicine

## 2020-05-27 ENCOUNTER — Ambulatory Visit: Payer: Medicare Other | Admitting: Internal Medicine

## 2020-05-27 ENCOUNTER — Other Ambulatory Visit: Payer: Medicare Other

## 2020-07-01 ENCOUNTER — Other Ambulatory Visit: Payer: Self-pay | Admitting: Internal Medicine

## 2020-07-01 DIAGNOSIS — E1122 Type 2 diabetes mellitus with diabetic chronic kidney disease: Secondary | ICD-10-CM

## 2020-07-01 DIAGNOSIS — N183 Chronic kidney disease, stage 3 unspecified: Secondary | ICD-10-CM

## 2020-07-03 ENCOUNTER — Telehealth: Payer: Medicare Other | Admitting: *Deleted

## 2020-07-03 NOTE — Telephone Encounter (Signed)
Stacy with [facility] [phone] option 2 called stating that patient family has contacted them regarding Palliative Care Services.  Wants to know if you agree and would be the attending.  Patient was last seen by Dr. Mariea Clonts in March.   Please Advise.

## 2020-07-05 ENCOUNTER — Telehealth: Payer: Self-pay | Admitting: Family Medicine

## 2020-07-05 NOTE — Telephone Encounter (Signed)
I called patient and leftt a vm to call back so we can schedule an awv

## 2020-07-06 NOTE — Telephone Encounter (Signed)
I think that is usually beneficial for most folks

## 2020-07-08 NOTE — Telephone Encounter (Signed)
[facility] notified.  

## 2020-07-09 ENCOUNTER — Other Ambulatory Visit: Payer: Self-pay | Admitting: Nurse Practitioner

## 2020-07-09 ENCOUNTER — Telehealth: Payer: Self-pay

## 2020-07-09 DIAGNOSIS — N183 Chronic kidney disease, stage 3 unspecified: Secondary | ICD-10-CM

## 2020-07-09 DIAGNOSIS — E1122 Type 2 diabetes mellitus with diabetic chronic kidney disease: Secondary | ICD-10-CM

## 2020-07-09 NOTE — Telephone Encounter (Signed)
Attempted to contact patient's son Jude to schedule a Palliative Care consult appointment. No answer left a message to return call.

## 2020-07-09 NOTE — Telephone Encounter (Signed)
Spoke with patient's son Dr. Alvie Heidelberg and scheduled an in-person Palliative Consult for 09/20/20 @ 12:30PM. He requested this appointment be in July.  COVID screening was negative. No pets in home. Patient lives in the guest home behind her son's home.  Consent obtained; updated Outlook/Netsmart/Team List and Epic.  Family is aware they may be receiving a call from NP the day before or day of to confirm appointment.

## 2020-07-11 ENCOUNTER — Telehealth: Payer: Self-pay | Admitting: *Deleted

## 2020-07-11 NOTE — Telephone Encounter (Signed)
Received Ostomy Supply Rx from [facility] [phone] Fax: [phone] Requesting Supplies and [place].  Placed form in Dr. Ammie Ferrier folder to review and sign.  To be faxed back once completed.

## 2020-07-17 ENCOUNTER — Other Ambulatory Visit: Payer: Self-pay | Admitting: Family Medicine

## 2020-07-17 DIAGNOSIS — E1122 Type 2 diabetes mellitus with diabetic chronic kidney disease: Secondary | ICD-10-CM

## 2020-07-17 DIAGNOSIS — N183 Chronic kidney disease, stage 3 unspecified: Secondary | ICD-10-CM

## 2020-07-23 ENCOUNTER — Other Ambulatory Visit: Payer: Self-pay | Admitting: *Deleted

## 2020-07-23 DIAGNOSIS — M545 Low back pain, unspecified: Secondary | ICD-10-CM

## 2020-07-23 DIAGNOSIS — D508 Other iron deficiency anemias: Secondary | ICD-10-CM

## 2020-07-23 DIAGNOSIS — E1122 Type 2 diabetes mellitus with diabetic chronic kidney disease: Secondary | ICD-10-CM

## 2020-07-23 DIAGNOSIS — M810 Age-related osteoporosis without current pathological fracture: Secondary | ICD-10-CM

## 2020-07-23 DIAGNOSIS — G8929 Other chronic pain: Secondary | ICD-10-CM

## 2020-07-23 MED ORDER — VITAMIN C 500 MG PO TABS: 500.0000 mg | ORAL_TABLET | Freq: Every day | ORAL | 0 refills | Status: AC

## 2020-07-23 MED ORDER — SODIUM BICARBONATE 650 MG PO TABS: 650.0000 mg | ORAL_TABLET | Freq: Two times a day (BID) | ORAL | 0 refills | Status: DC

## 2020-07-23 MED ORDER — PIOGLITAZONE HCL 15 MG PO TABS: 15.0000 mg | ORAL_TABLET | Freq: Every day | ORAL | 0 refills | Status: DC

## 2020-07-23 MED ORDER — TRAMADOL HCL 50 MG PO TABS: ORAL_TABLET | ORAL | 0 refills | Status: DC

## 2020-07-23 MED ORDER — POLYSACCHARIDE IRON COMPLEX 150 MG PO CAPS: ORAL_CAPSULE | ORAL | 0 refills | Status: AC

## 2020-07-23 MED ORDER — D3 HIGH POTENCY 50 MCG (2000 UT) PO CAPS
2000.0000 [IU] | ORAL_CAPSULE | Freq: Every day | ORAL | 0 refills | Status: DC
Start: 2020-07-23 — End: 2021-11-10

## 2020-07-23 NOTE — Telephone Encounter (Signed)
Cheryl Salas with [facility] called and stated that they pre package patient's medications and patient is going out of town this Friday and will not return until July 4. Requesting medications. Requesting at least enough for 42 days. (Pended for 90 days (except Tramadol) supply with note that patient needs an appointment before anymore future refills.) Pended and sent to Dr. Sabra Heck for approval.

## 2020-08-08 ENCOUNTER — Telehealth: Payer: Self-pay

## 2020-08-08 NOTE — Telephone Encounter (Signed)
Spoke with patient's son, Jude Manship. Jude stated that patient would like to continue to have Dr. Mariea Clonts as PCP. Scheduled visit for 09/17/20 @ 1pm

## 2020-08-12 NOTE — Progress Notes (Unsigned)
Late Entry for 08/08/20 telephone note:   Patient request to continue with Dr Hollace Kinnier as community PCP, RN called to follow up on patient request Or :  RN returned call to patient to follow up on request from patient to continue with Dr. Hollace Kinnier as provider

## 2020-09-17 ENCOUNTER — Telehealth: Payer: Self-pay | Admitting: *Deleted

## 2020-09-17 ENCOUNTER — Other Ambulatory Visit: Payer: Medicare Other | Admitting: Internal Medicine

## 2020-09-17 NOTE — Telephone Encounter (Signed)
Dr. Lenore Cordia called back.  I explained to him what the Practice Administrator said that She had spoken with the providers, here at our office,  and we would be more than happy to reestablish the patient and refill the medications with an appointment or he could call Dr. Cyndi Lennert office to get the refills.   There was verbal consent over 30 days ago that they wanted Dr. Mariea Clonts to be their PCP and an appointment was made to establish with her for 09/17/2020. (Telephone note dated 08/08/2020). On 07/23/2020 a Refill note from our office was added that patient needed an appointment before anymore future refills.   I offered him an appointment and he refused. Stated that we just needed to refill the medications. He stated that I did not understand what he was asking and stated that patient has not seen Dr. Mariea Clonts yet at the new place and it was our responsibility to refill the medications.   I offered him an appointment to reestablish with our office to refill the medications and he wanted to speak with the Practice Administrator.   Practice Administrator went to pick up call and he was not there.

## 2020-09-17 NOTE — Telephone Encounter (Signed)
LMOM to return call.

## 2020-09-17 NOTE — Telephone Encounter (Signed)
Patient son, Dr. Lenore Cordia, called and stated that patient is leaving to go with Dr. Mariea Clonts and has an appointment with her for 09/24/2020.  Dr. Lenore Cordia stated that Dr. Mariea Clonts is on Vacation and unable to refill patient's medications and he is requesting our office to refill them.   I tried to explain to him that Dr. Mariea Clonts is listed as the Patient's PCP and she would need to refill the medications or have someone that was covering for her to refill them.  He stated that technically she is still our patient until she see's Dr. Mariea Clonts.   They consented to Dr. Mariea Clonts being her PCP on 08/08/2020.   He stated that we need to refill the medications. I told him that I would have to discuss this with the office manager and call him back.  [phone]

## 2020-09-20 ENCOUNTER — Other Ambulatory Visit: Payer: Medicare Other | Admitting: Nurse Practitioner

## 2020-09-24 ENCOUNTER — Other Ambulatory Visit: Payer: Medicare Other | Admitting: Internal Medicine

## 2021-03-18 ENCOUNTER — Other Ambulatory Visit (HOSPITAL_COMMUNITY): Payer: Self-pay | Admitting: Neurosurgery

## 2021-03-18 ENCOUNTER — Other Ambulatory Visit: Payer: Self-pay | Admitting: Neurosurgery

## 2021-03-18 DIAGNOSIS — M5412 Radiculopathy, cervical region: Secondary | ICD-10-CM

## 2021-03-28 ENCOUNTER — Ambulatory Visit (HOSPITAL_COMMUNITY)
Admission: RE | Admit: 2021-03-28 | Discharge: 2021-03-28 | Disposition: A | Discharge status: Home / Self Care | Payer: Medicare Other | Source: Ambulatory Visit | Attending: Neurosurgery | Admitting: Neurosurgery

## 2021-03-28 ENCOUNTER — Other Ambulatory Visit: Payer: Self-pay

## 2021-03-28 DIAGNOSIS — M5412 Radiculopathy, cervical region: Secondary | ICD-10-CM | POA: Diagnosis present

## 2021-04-18 ENCOUNTER — Telehealth: Payer: Self-pay | Admitting: *Deleted

## 2021-04-18 NOTE — Telephone Encounter (Signed)
Received a voicemail from [employer] stating that patient needs refills on several of her medications. (Zolpidem, Sodium Bicarb, PolyIron, Vit D3, Vit C, Fosamax, Lomotil and Actos). I spoke with Dr. Mariea Clonts to make aware and she authorized 5 refills on each medication. I called pharmacy to provide refill authorization from MD.

## 2021-05-09 ENCOUNTER — Telehealth: Payer: Self-pay | Admitting: Internal Medicine

## 2021-05-09 DIAGNOSIS — R0600 Dyspnea, unspecified: Secondary | ICD-10-CM

## 2021-05-09 DIAGNOSIS — R0601 Orthopnea: Secondary | ICD-10-CM

## 2021-05-09 NOTE — Telephone Encounter (Signed)
Discussed with Jude about episode at 330am last night.  He reports she also had this at the hospital in [place] when she had her suspected stroke and her ulnar n impingement.  She is back to full use of her arm now.  She had been lying flat when she woke up sob.  No pulse oximetry available.  We opted to pursue an echocardiogram for paroxysmal nocturnal dyspnea and orthopnea.  Order placed.  Jude will purchase a pulse oximeter and advise his mother to elevate the [facility] to help prevent recurrence.  He will keep me posted with any changes.  We have planned to recheck bmp after one month on jardiance and hopefully see her and review the echo then, unless something changes.   ?

## 2021-06-03 ENCOUNTER — Ambulatory Visit (HOSPITAL_COMMUNITY): Payer: Medicare Other | Attending: Internal Medicine

## 2021-06-03 ENCOUNTER — Other Ambulatory Visit: Payer: Self-pay

## 2021-06-03 DIAGNOSIS — R0600 Dyspnea, unspecified: Secondary | ICD-10-CM | POA: Diagnosis present

## 2021-06-03 DIAGNOSIS — R0601 Orthopnea: Secondary | ICD-10-CM | POA: Insufficient documentation

## 2021-06-03 LAB — ECHOCARDIOGRAM COMPLETE
Area-P 1/2: 5.13 cm2
S' Lateral: 2.2 cm

## 2021-06-24 ENCOUNTER — Observation Stay (HOSPITAL_COMMUNITY): Payer: Medicare Other

## 2021-06-24 ENCOUNTER — Inpatient Hospital Stay (HOSPITAL_COMMUNITY)
Admission: EM | Admit: 2021-06-24 | Discharge: 2021-06-28 | DRG: 390 | Disposition: A | Discharge status: Home / Self Care | Payer: Medicare Other | Attending: Internal Medicine | Admitting: Internal Medicine

## 2021-06-24 ENCOUNTER — Emergency Department (HOSPITAL_COMMUNITY): Payer: Medicare Other

## 2021-06-24 ENCOUNTER — Telehealth: Payer: Self-pay | Admitting: Internal Medicine

## 2021-06-24 DIAGNOSIS — J029 Acute pharyngitis, unspecified: Secondary | ICD-10-CM | POA: Diagnosis present

## 2021-06-24 DIAGNOSIS — Z79899 Other long term (current) drug therapy: Secondary | ICD-10-CM

## 2021-06-24 DIAGNOSIS — Z833 Family history of diabetes mellitus: Secondary | ICD-10-CM

## 2021-06-24 DIAGNOSIS — K565 Intestinal adhesions [bands], unspecified as to partial versus complete obstruction: Secondary | ICD-10-CM | POA: Diagnosis not present

## 2021-06-24 DIAGNOSIS — G8929 Other chronic pain: Secondary | ICD-10-CM | POA: Diagnosis present

## 2021-06-24 DIAGNOSIS — K56609 Unspecified intestinal obstruction, unspecified as to partial versus complete obstruction: Secondary | ICD-10-CM | POA: Diagnosis not present

## 2021-06-24 DIAGNOSIS — E876 Hypokalemia: Secondary | ICD-10-CM | POA: Diagnosis present

## 2021-06-24 DIAGNOSIS — D509 Iron deficiency anemia, unspecified: Secondary | ICD-10-CM

## 2021-06-24 DIAGNOSIS — K449 Diaphragmatic hernia without obstruction or gangrene: Secondary | ICD-10-CM | POA: Diagnosis present

## 2021-06-24 DIAGNOSIS — E118 Type 2 diabetes mellitus with unspecified complications: Secondary | ICD-10-CM

## 2021-06-24 DIAGNOSIS — R519 Headache, unspecified: Secondary | ICD-10-CM | POA: Diagnosis present

## 2021-06-24 DIAGNOSIS — E1142 Type 2 diabetes mellitus with diabetic polyneuropathy: Secondary | ICD-10-CM | POA: Diagnosis present

## 2021-06-24 DIAGNOSIS — K435 Parastomal hernia without obstruction or  gangrene: Secondary | ICD-10-CM | POA: Diagnosis present

## 2021-06-24 DIAGNOSIS — E782 Mixed hyperlipidemia: Secondary | ICD-10-CM | POA: Diagnosis present

## 2021-06-24 DIAGNOSIS — M109 Gout, unspecified: Secondary | ICD-10-CM | POA: Diagnosis present

## 2021-06-24 DIAGNOSIS — R0602 Shortness of breath: Secondary | ICD-10-CM | POA: Diagnosis present

## 2021-06-24 DIAGNOSIS — I10 Essential (primary) hypertension: Secondary | ICD-10-CM

## 2021-06-24 DIAGNOSIS — N183 Chronic kidney disease, stage 3 unspecified: Secondary | ICD-10-CM | POA: Diagnosis present

## 2021-06-24 DIAGNOSIS — Z8551 Personal history of malignant neoplasm of bladder: Secondary | ICD-10-CM

## 2021-06-24 DIAGNOSIS — Z885 Allergy status to narcotic agent status: Secondary | ICD-10-CM

## 2021-06-24 DIAGNOSIS — Z9013 Acquired absence of bilateral breasts and nipples: Secondary | ICD-10-CM

## 2021-06-24 DIAGNOSIS — R112 Nausea with vomiting, unspecified: Secondary | ICD-10-CM

## 2021-06-24 DIAGNOSIS — Z8249 Family history of ischemic heart disease and other diseases of the circulatory system: Secondary | ICD-10-CM

## 2021-06-24 DIAGNOSIS — M549 Dorsalgia, unspecified: Secondary | ICD-10-CM | POA: Diagnosis present

## 2021-06-24 DIAGNOSIS — E785 Hyperlipidemia, unspecified: Secondary | ICD-10-CM

## 2021-06-24 DIAGNOSIS — E1122 Type 2 diabetes mellitus with diabetic chronic kidney disease: Secondary | ICD-10-CM | POA: Diagnosis present

## 2021-06-24 DIAGNOSIS — I129 Hypertensive chronic kidney disease with stage 1 through stage 4 chronic kidney disease, or unspecified chronic kidney disease: Secondary | ICD-10-CM | POA: Diagnosis present

## 2021-06-24 DIAGNOSIS — R030 Elevated blood-pressure reading, without diagnosis of hypertension: Secondary | ICD-10-CM

## 2021-06-24 DIAGNOSIS — Z853 Personal history of malignant neoplasm of breast: Secondary | ICD-10-CM

## 2021-06-24 DIAGNOSIS — Z800 Family history of malignant neoplasm of digestive organs: Secondary | ICD-10-CM

## 2021-06-24 DIAGNOSIS — Z7983 Long term (current) use of bisphosphonates: Secondary | ICD-10-CM

## 2021-06-24 DIAGNOSIS — Z888 Allergy status to other drugs, medicaments and biological substances status: Secondary | ICD-10-CM

## 2021-06-24 DIAGNOSIS — N1832 Chronic kidney disease, stage 3b: Secondary | ICD-10-CM | POA: Diagnosis present

## 2021-06-24 DIAGNOSIS — Z7984 Long term (current) use of oral hypoglycemic drugs: Secondary | ICD-10-CM

## 2021-06-24 DIAGNOSIS — Z9049 Acquired absence of other specified parts of digestive tract: Secondary | ICD-10-CM

## 2021-06-24 DIAGNOSIS — Z7982 Long term (current) use of aspirin: Secondary | ICD-10-CM

## 2021-06-24 DIAGNOSIS — Z932 Ileostomy status: Secondary | ICD-10-CM

## 2021-06-24 LAB — URINALYSIS, ROUTINE W REFLEX MICROSCOPIC
Bilirubin Urine: NEGATIVE
Glucose, UA: >=500 mg/dL — AB
Hgb urine dipstick: NEGATIVE
Ketones, ur: NEGATIVE mg/dL
Nitrite: NEGATIVE
Protein, ur: NEGATIVE mg/dL
Specific Gravity, Urine: 1.016 (ref 1.005–1.030)
pH: 5 (ref 5.0–8.0)

## 2021-06-24 LAB — COMPREHENSIVE METABOLIC PANEL
ALT: 22 U/L (ref 0–44)
AST: 22 U/L (ref 15–41)
Albumin: 4.3 g/dL (ref 3.5–5.0)
Alkaline Phosphatase: 37 U/L — ABNORMAL LOW (ref 38–126)
Anion gap: 12 (ref 5–15)
BUN: 26 mg/dL — ABNORMAL HIGH (ref 8–23)
CO2: 21 mmol/L — ABNORMAL LOW (ref 22–32)
Calcium: 9.7 mg/dL (ref 8.9–10.3)
Chloride: 106 mmol/L (ref 98–111)
Creatinine, Ser: 1.68 mg/dL — ABNORMAL HIGH (ref 0.44–1.00)
GFR, Estimated: 28 mL/min — ABNORMAL LOW (ref >60)
Glucose, Bld: 251 mg/dL — ABNORMAL HIGH (ref 70–99)
Potassium: 4 mmol/L (ref 3.5–5.1)
Sodium: 139 mmol/L (ref 135–145)
Total Bilirubin: 0.7 mg/dL (ref 0.3–1.2)
Total Protein: 7.2 g/dL (ref 6.5–8.1)

## 2021-06-24 LAB — CBC
HCT: 35.1 % — ABNORMAL LOW (ref 36.0–46.0)
Hemoglobin: 11.8 g/dL — ABNORMAL LOW (ref 12.0–15.0)
MCH: 32.2 pg (ref 26.0–34.0)
MCHC: 33.6 g/dL (ref 30.0–36.0)
MCV: 95.9 fL (ref 80.0–100.0)
Platelets: 214 10*3/uL (ref 150–400)
RBC: 3.66 MIL/uL — ABNORMAL LOW (ref 3.87–5.11)
RDW: 13.4 % (ref 11.5–15.5)
WBC: 7.5 10*3/uL (ref 4.0–10.5)
nRBC: 0 % (ref 0.0–0.2)

## 2021-06-24 LAB — MAGNESIUM: Magnesium: 1.7 mg/dL (ref 1.7–2.4)

## 2021-06-24 LAB — LIPASE, BLOOD: Lipase: 59 U/L — ABNORMAL HIGH (ref 11–51)

## 2021-06-24 LAB — TROPONIN I (HIGH SENSITIVITY): Troponin I (High Sensitivity): 9 ng/L (ref <18)

## 2021-06-24 MED ORDER — PHENOL 1.4 % MT LIQD
1.0000 | OROMUCOSAL | Status: DC | PRN
Start: 2021-06-24 — End: 2021-06-28
  Administered 2021-06-24: 1 via OROMUCOSAL
  Filled 2021-06-24: qty 177

## 2021-06-24 MED ORDER — INSULIN ASPART 100 UNIT/ML IJ SOLN
0.0000 [IU] | INTRAMUSCULAR | Status: DC
  Administered 2021-06-25 (×2): 1 [IU] via SUBCUTANEOUS
  Administered 2021-06-26: 7 [IU] via SUBCUTANEOUS
  Administered 2021-06-26 (×2): 2 [IU] via SUBCUTANEOUS
  Administered 2021-06-27: 3 [IU] via SUBCUTANEOUS
  Administered 2021-06-27: 1 [IU] via SUBCUTANEOUS
  Administered 2021-06-27: 5 [IU] via SUBCUTANEOUS
  Administered 2021-06-27: 1 [IU] via SUBCUTANEOUS
  Administered 2021-06-28: 2 [IU] via SUBCUTANEOUS
  Administered 2021-06-28: 3 [IU] via SUBCUTANEOUS
  Administered 2021-06-28 (×2): 2 [IU] via SUBCUTANEOUS

## 2021-06-24 MED ORDER — ONDANSETRON HCL 4 MG/2ML IJ SOLN
4.0000 mg | Freq: Once | INTRAMUSCULAR | Status: AC
Start: 2021-06-24 — End: 2021-06-24
  Administered 2021-06-24: 4 mg via INTRAVENOUS
  Filled 2021-06-24: qty 2

## 2021-06-24 MED ORDER — ONDANSETRON HCL 4 MG/2ML IJ SOLN: 4.0000 mg | Freq: Four times a day (QID) | INTRAMUSCULAR | Status: DC | PRN

## 2021-06-24 MED ORDER — ACETAMINOPHEN 10 MG/ML IV SOLN
1000.0000 mg | Freq: Once | INTRAVENOUS | Status: AC
  Administered 2021-06-24: 1000 mg via INTRAVENOUS
  Filled 2021-06-24: qty 100

## 2021-06-24 MED ORDER — ACETAMINOPHEN 10 MG/ML IV SOLN
1000.0000 mg | Freq: Once | INTRAVENOUS | Status: AC | PRN
  Administered 2021-06-25: 1000 mg via INTRAVENOUS
  Filled 2021-06-24: qty 100

## 2021-06-24 MED ORDER — LACTATED RINGERS IV SOLN: INTRAVENOUS | Status: DC

## 2021-06-24 MED ORDER — ENOXAPARIN SODIUM 30 MG/0.3ML IJ SOSY
30.0000 mg | PREFILLED_SYRINGE | INTRAMUSCULAR | Status: DC
  Administered 2021-06-25 – 2021-06-27 (×3): 30 mg via SUBCUTANEOUS
  Filled 2021-06-24 (×3): qty 0.3

## 2021-06-24 MED ORDER — LORAZEPAM 2 MG/ML IJ SOLN
0.2500 mg | Freq: Once | INTRAMUSCULAR | Status: AC
  Administered 2021-06-24: 0.25 mg via INTRAVENOUS
  Filled 2021-06-24: qty 1

## 2021-06-24 MED ORDER — ONDANSETRON 4 MG PO TBDP
4.0000 mg | ORAL_TABLET | Freq: Once | ORAL | Status: AC
  Administered 2021-06-24: 4 mg via ORAL
  Filled 2021-06-24: qty 1

## 2021-06-24 MED ORDER — DIATRIZOATE MEGLUMINE & SODIUM 66-10 % PO SOLN
90.0000 mL | Freq: Once | ORAL | Status: AC
  Administered 2021-06-25: 90 mL via NASOGASTRIC
  Filled 2021-06-24: qty 90

## 2021-06-24 MED ORDER — ONDANSETRON HCL 4 MG PO TABS
4.0000 mg | ORAL_TABLET | Freq: Four times a day (QID) | ORAL | Status: DC | PRN
Start: 2021-06-24 — End: 2021-06-28

## 2021-06-24 MED ORDER — MORPHINE SULFATE (PF) 2 MG/ML IV SOLN
2.0000 mg | INTRAVENOUS | Status: DC | PRN
  Administered 2021-06-24: 2 mg via INTRAVENOUS
  Filled 2021-06-24 (×2): qty 1

## 2021-06-24 MED ORDER — SODIUM CHLORIDE 0.9 % IV BOLUS
1000.0000 mL | Freq: Once | INTRAVENOUS | Status: AC
  Administered 2021-06-24: 1000 mL via INTRAVENOUS

## 2021-06-24 MED ORDER — MORPHINE SULFATE (PF) 2 MG/ML IV SOLN
2.0000 mg | Freq: Once | INTRAVENOUS | Status: AC
Start: 2021-06-24 — End: 2021-06-24
  Administered 2021-06-24: 2 mg via INTRAVENOUS
  Filled 2021-06-24: qty 1

## 2021-06-24 MED ORDER — HYDRALAZINE HCL 20 MG/ML IJ SOLN: 5.0000 mg | Freq: Four times a day (QID) | INTRAMUSCULAR | Status: DC | PRN

## 2021-06-24 NOTE — Telephone Encounter (Signed)
Pt's son called on call early this am due to his mother having abdominal pain and gas throughout her abdomen. She felt nauseous but could not vomit.  She felt like she had a ton of gas, was very weak and dry.  She was drinking water.  When I called them back at 819am, she was very uncomfortable, leaning over and groaning, also saying she could not breathe anymore.  We agreed that she needed emergency evaluation.  She has a large hernia and biggest concern is that she has an obstruction/incarceration from this.   ? ?Note that she does have a MOST form on file completed in the past year.  Advised her son that he and anyone seeing her can certainly call me if needed or more information about her. ? ?Ahnya Akre L. Mariea Clonts DO, CMD ?Manufacturing engineer ?Chronic Disease Management and Palliative Care  ?[place] ?[place], [place] [address] ?Office (8:30am-5pm):  [phone]) ?Mobile (8:30am-5pm):  [phone] ?On call (after 5pm):  [phone] ? ?

## 2021-06-24 NOTE — ED Triage Notes (Signed)
EMS stated, N/V this orning and hard to catch her breath. Complains of stomach discomfort and has a hernia. ?

## 2021-06-24 NOTE — ED Notes (Signed)
Port abd x ray done for tube placement ?

## 2021-06-24 NOTE — Consult Note (Signed)
? ? ? ? ?Consult Note ? ?Cheryl Salas ?03/05/1925  ?[id number].   ? ?Requesting MD: Lajean Saver, MD ?Chief Complaint/Reason for Consult: SBO ?HPI:  ?Patient is a 88 year old female who presented to the ED with nausea, vomiting and abdominal pain that started last night. Pain was acute onset and is generalized. She reports that it feels like it radiates to her back and was about 8/10 in severity. Pain had been constant since onset and morphine is the only thing that has helped. She describes dry heaving prior to presentation, then emesis in the ED. She has an ileostomy with decreased output, non-bloody. She does report two episodes of shortness of breath since this began. ?Per patient ileostomy was done in 2017 in [place] secondary to complication from diverticulitis. She has not had any SBO since that time. She has also had a bladder suspension, transurethral resection of a bladder tumor, hysterectomy, and open cholecystectomy. Denies fever, chills, chest pain, urinary symptoms, recent changes in medications, or sick contacts.  ? ?PMH otherwise significant for Hx of bladder cancer s/p transurethral resection of bladder tumor, Hx of breast cancer s/p bilateral mastectomies, CKD stage III, T2DM, HTN, HLD, B12 deficiency, Iron deficiency anemia, Peripheral neuropathy, Vitamin D deficiency, Chronic back pain and Gout. Patient lives part time with her son but in her own living quarters. She also lives part time in [place] independently. Allergies listed to codeine and flexeril.  ? ?ROS: ?Negative other than mentioned in HPI.  ? ?Family History  ?Problem Relation Age of Onset  ? Transient ischemic attack Mother 58  ? Heart attack Father 64  ? Pancreatic cancer Sister 70  ? Epilepsy Brother 44  ? Diabetes Brother 59  ? Dementia Sister 67  ? Diabetes Brother   ? Stroke Brother 36  ? Alcoholism Brother 70  ? Cancer - Other Son 58  ?     liver cancer; [place] Vet  ? Heart attack Son 11  ? Diabetes Son   ? Anuerysm  Daughter 67  ?     brain  ? ? ?Past Medical History:  ?Diagnosis Date  ? Allergic rhinitis   ? Per Records from [place]   ? Anemia   ? Per Records from [place]   ? Arthralgia of multiple sites   ? Per Records from [place]   ? B12 deficiency   ? Per Records from [place]   ? Baker's cyst   ? Per Records from [place]   ? Bladder cancer ([place])   ? CKD (chronic kidney disease) stage 3, GFR 30-59 ml/min (HCC)   ? Per Records from [place]   ? Dehydration   ? Per Records from [place]   ? Dextroscoliosis   ? Per Records from [place]   ? Diabetes ([place])   ? Diverticulitis   ? Per Records from [place]   ? Diverticulosis   ? Fecal occult blood test positive   ? Per Records from [place]   ? Gout   ? left foot  ? 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[place])   ? Per Records from [place]   ? Hypertension   ? Per Records from [place]   ? Hypocalcemia   ? Per Records from [place]   ? Hypomagnesemia   ? Per Records from [place]   ? Iron deficiency   ? Per Records from [place]   ? Large bowel obstruction (HCC)   ? Per Records from [place]   ? Mixed dyslipidemia   ? Per Records from [place]   ?  Myopathy   ? Per Records from [place]   ? Onychomycosis   ? Per Records from [place]   ? Peripheral neuropathy   ? Per Records from [place]   ? Recurrent UTI   ? Vitamin D deficiency   ? Per Records from [place]   ? ? ?Past Surgical History:  ?Procedure Laterality Date  ? BLADDER SUSPENSION    ? Per Records from [place]   ? CATARACT EXTRACTION, BILATERAL    ? CHOLECYSTECTOMY  1978  ? hysterectomy  1966  ? ILEOSTOMY  2017  ? KYPHOPLASTY  2019  ? MASTECTOMY Right 1998  ? MASTECTOMY Left 2011  ? TRANSURETHRAL RESECTION OF BLADDER TUMOR    ? ? ?Social History:  reports that she has never smoked. She has never used smokeless tobacco. She reports that she does not drink alcohol and does not use drugs. ? ?Allergies:  ?Allergies  ?Allergen Reactions  ? Codeine  Itching, Nausea Only and Swelling  ? Flexeril [Cyclobenzaprine]   ?  Per Records from [place]   ? ? ?(Not in a hospital admission) ? ? ?Blood pressure (!) 151/83, pulse (!) 107, temperature 98.4 ?F (36.9 ?C), temperature source Oral, resp. rate 17, SpO2 94 %. ?Physical Exam:  ?General: pleasant, WD, elderly female who is laying in bed in NAD ?HEENT: head is normocephalic, atraumatic.  Sclera are noninjected.  Ears and nose without any masses or lesions.  Mouth is dry ?Heart: regular, rate, and rhythm. ?Lungs: CTAB, no wheezes, rhonchi, or rales noted.  Respiratory effort nonlabored ?Abd: soft, mild distension, mild right sided abdominal TTP, ileostomy in place with some air and stool in bag, ventral and parastomal hernias without incarceration ?MS: trace BLE edema ?Skin: warm and dry with no masses, lesions, or rashes ?Neuro: MAEs ?Psych: A&Ox3 with an appropriate affect. ? ? ?Results for orders placed or performed during the hospital encounter of 06/24/21 (from the past 48 hour(s))  ?Lipase, blood     Status: Abnormal  ? Collection Time: 06/24/21  9:34 AM  ?Result Value Ref Range  ? Lipase 59 (H) 11 - 51 U/L  ?  Comment: Performed at [facility], [place] [address] [place], [place] [address]  ?Comprehensive metabolic panel     Status: Abnormal  ? Collection Time: 06/24/21  9:34 AM  ?Result Value Ref Range  ? Sodium 139 135 - 145 mmol/L  ? Potassium 4.0 3.5 - 5.1 mmol/L  ? Chloride 106 98 - 111 mmol/L  ? CO2 21 (L) 22 - 32 mmol/L  ? Glucose, Bld 251 (H) 70 - 99 mg/dL  ?  Comment: Glucose reference range applies only to samples taken after fasting for at least 8 hours.  ? BUN 26 (H) 8 - 23 mg/dL  ? Creatinine, Ser 1.68 (H) 0.44 - 1.00 mg/dL  ? Calcium 9.7 8.9 - 10.3 mg/dL  ? Total Protein 7.2 6.5 - 8.1 g/dL  ? Albumin 4.3 3.5 - 5.0 g/dL  ? AST 22 15 - 41 U/L  ? ALT 22 0 - 44 U/L  ? Alkaline Phosphatase 37 (L) 38 - 126 U/L  ? Total Bilirubin 0.7 0.3 - 1.2 mg/dL  ? GFR, Estimated 28 (L) >60 mL/min  ?  Comment:  (NOTE) ?Calculated using the CKD-EPI Creatinine Equation (2021) ?  ? Anion gap 12 5 - 15  ?  Comment: Performed at [facility], [place] [address] [place], [place] [address]  ?CBC     Status: Abnormal  ? Collection  Time: 06/24/21  9:34 AM  ?Result Value Ref Range  ? WBC 7.5 4.0 - 10.5 K/uL  ? RBC 3.66 (L) 3.87 - 5.11 MIL/uL  ? Hemoglobin 11.8 (L) 12.0 - 15.0 g/dL  ? HCT 35.1 (L) 36.0 - 46.0 %  ? MCV 95.9 80.0 - 100.0 fL  ? MCH 32.2 26.0 - 34.0 pg  ? MCHC 33.6 30.0 - 36.0 g/dL  ? RDW 13.4 11.5 - 15.5 %  ? Platelets 214 150 - 400 K/uL  ? nRBC 0.0 0.0 - 0.2 %  ?  Comment: Performed at [facility], [place] [address] [place], [place] [address]  ?Magnesium     Status: None  ? Collection Time: 06/24/21  9:34 AM  ?Result Value Ref Range  ? Magnesium 1.7 1.7 - 2.4 mg/dL  ?  Comment: Performed at [facility], [place] [address], [place], [place] [address]  ?Urinalysis, Routine w reflex microscopic     Status: Abnormal  ? Collection Time: 06/24/21  9:39 AM  ?Result Value Ref Range  ? Color, Urine YELLOW YELLOW  ? APPearance CLEAR CLEAR  ? Specific Gravity, Urine 1.016 1.005 - 1.030  ? pH 5.0 5.0 - 8.0  ? Glucose, UA >=500 (A) NEGATIVE mg/dL  ? Hgb urine dipstick NEGATIVE NEGATIVE  ? Bilirubin Urine NEGATIVE NEGATIVE  ? Ketones, ur NEGATIVE NEGATIVE mg/dL  ? Protein, ur NEGATIVE NEGATIVE mg/dL  ? Nitrite NEGATIVE NEGATIVE  ? Leukocytes,Ua TRACE (A) NEGATIVE  ? RBC / HPF 0-5 0 - 5 RBC/hpf  ? WBC, UA 6-10 0 - 5 WBC/hpf  ? Bacteria, UA RARE (A) NONE SEEN  ? Squamous Epithelial / LPF 0-5 0 - 5  ?  Comment: Performed at [facility], [place] [address], [place], [place] [address]  ?Troponin I (High Sensitivity)     Status: None  ? Collection Time: 06/24/21 11:18 AM  ?Result Value Ref Range  ? Troponin I (High Sensitivity) 9 <18 ng/L  ?  Comment: (NOTE) ?Elevated high sensitivity troponin I (hsTnI) values and significant  ?changes across serial measurements may suggest ACS but many other  ?chronic and acute conditions  are known to elevate hsTnI results.  ?Refer to the "Links" section for chest pain algorithms and additional  ?guidance. ?Performed at [facility], [place] [address], [place], [place] ?[address] ?  ? ?CT

## 2021-06-24 NOTE — ED Notes (Signed)
Got patient on the bed on the monitor patient is resting with family at bedside ?

## 2021-06-24 NOTE — Assessment & Plan Note (Signed)
On hydralazine TID ?Hold oral and place IV prn parameters  ?

## 2021-06-24 NOTE — ED Notes (Signed)
NGT advance 5cm per surgeries recommendation, placement xray to be ordered ?

## 2021-06-24 NOTE — Progress Notes (Signed)
Pt became very anxious and thought her NG tube was stuck in her throat and stopping her from breathing. O2 sats on room air was 100%. Assured pt that everything looks fine with her tube. Had charge nurse also come in to assess to make sure everything was ok with the NG tube. Pt;s son helped try and calm pt down. Pt still very anxious and wanted to removed NG tube she felt it was stopping her from breathing. Provider paged, pt's son would like pt to have a sedative to help her calm down. See new orders.  ?

## 2021-06-24 NOTE — ED Notes (Signed)
PCXR being done.

## 2021-06-24 NOTE — ED Notes (Signed)
Surgery paged in reguards to NG placement read by radiology ?

## 2021-06-24 NOTE — Assessment & Plan Note (Addendum)
88 year old female presenting with N/V and abdominal pain found to have SBO likely secondary to adhesions ?-Admit to MedSurg and make n.p.o. ?-no clinical findings for emergent surgery at this time  ?-Gentle IV fluid hydration ?-Pain medication ?-encourage ambulation ?-Monitor electrolytes ?-General surgery consulted and will place consult note but not a surgical candidate at this time and we can manage conservatively ?Recommend NG tube and SBO protocol.  ?-okay for chemical VTE prophylaxis  ? ?

## 2021-06-24 NOTE — ED Notes (Signed)
Placement of NG tube still unclear, radiology contacted and shows NJ tube within hiatal hernia. Norvel Richards, RN notified of same.  ?

## 2021-06-24 NOTE — Assessment & Plan Note (Signed)
Continue crestor when no longer NPO  ?

## 2021-06-24 NOTE — Assessment & Plan Note (Signed)
Creatine baseline: 1.4-1.6, stable ?Continue to monitor  ?

## 2021-06-24 NOTE — H&P (Signed)
?History and Physical  ? ? ?Patient: Cheryl Salas IWP:[id number] DOB: 03/05/1925 ?DOA: 06/24/2021 ?DOS: the patient was seen and examined on 06/24/2021 ?PCP: Gayland Curry, DO  ?Patient coming from: Home - lives in her own house that is an attached area of son's house.  ? ? ?Chief Complaint: nausea/vomiting and abdominal pain  ? ?HPI: Cheryl Salas is a 88 y.o. female with medical history significant of CKD stage 3,  T2DM, HTN, HLD, diverticulitis s/p ileostomy, hx of bladder and breast cancer with bilateral mastectomies who presented to ED with complaints of stomach pain and N/V that started last night. She didn't wake her son up so called him at 6:30 AM. She never had productive emesis, more dry heaving.  ?She has ileostomy, did have some brown liquid, but is decreased. Last cleaned bag yesterday, early this AM with normal output. No blood.  ?Pain generalized, feels like it radiated to back. Rated 8/10 and described as general pain. Pain has been constant until she got morphine. Ileostomy bag now with some stool and full of gas.  ? ?No hx of obstruction. She did have obstruction secondary to complication from diverticulitis and ileostomy placed 6 years ago in [place]. Other surgeries: hysterectomy and bladder suspension.  ? ?No sigmoid/colonoscopy since ileostomy done. Does not follow with GI.  ? ? ?She has been feeling good up until last night. Denies any fever/chills, vision changes/headaches, chest pain or palpitations, shortness of breath or cough, dysuria or leg swelling.  ? ?Recently had her actos stopped and changed to jardiance for her diabetes.  ? ?ER Course:  vitals: afebrile, bp: 174/101, HR: 108, RR: 16, oxygen: 100%RA ?Pertinent labs: hgb: 11.8 (10-11), BUN: 26, creatinine: 1.6/ (1.4-1.6),  ?CT abdo: bowel obstruction with both gastric and proximal small bowel distention. Jejunum diverticulosis. Post colectomy and creation of end ileostomy in right lower quadrant. Large hiatal hernia with distended  stomach extending into the chest, ventral wall hernia and parastomal hernia containing small bowel loops, no site of obstruction.  ?In ED: general surgery consulted. Given 1L bolus and NG tube ordered.  ? ? ?Review of Systems: As mentioned in the history of present illness. All other systems reviewed and are negative. ?Past Medical History:  ?Diagnosis Date  ? Allergic rhinitis   ? Per Records from [place]   ? Anemia   ? Per Records from [place]   ? Arthralgia of multiple sites   ? Per Records from [place]   ? B12 deficiency   ? Per Records from [place]   ? Baker's cyst   ? Per Records from [place]   ? Bladder cancer ([place])   ? CKD (chronic kidney disease) stage 3, GFR 30-59 ml/min (HCC)   ? Per Records from [place]   ? Dehydration   ? Per Records from [place]   ? Dextroscoliosis   ? Per Records from [place]   ? Diabetes ([place])   ? Diverticulitis   ? Per Records from [place]   ? Diverticulosis   ? Fecal occult blood test positive   ? Per Records from [place]   ? Gout   ? left foot  ? 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 Per Records from [place]   ? Hypertension   ? Per Records from [place]   ? Hypocalcemia   ? Per Records from [place]   ? Hypomagnesemia   ? Per Records from [place]   ? Iron deficiency   ? Per Records from [place]   ? Large bowel  obstruction ([place])   ? Per Records from [place]   ? Mixed dyslipidemia   ? Per Records from [place]   ? Myopathy   ? Per Records from [place]   ? Onychomycosis   ? Per Records from [place]   ? Peripheral neuropathy   ? Per Records from [place]   ? Recurrent UTI   ? Vitamin D deficiency   ? Per Records from [place]   ? ?Past Surgical History:  ?Procedure Laterality Date  ? BLADDER SUSPENSION    ? Per Records from [place]   ? CATARACT EXTRACTION, BILATERAL    ? CHOLECYSTECTOMY  1978  ? hysterectomy  1966  ? ILEOSTOMY  2017  ? KYPHOPLASTY  2019  ? MASTECTOMY Right 1998  ?  MASTECTOMY Left 2011  ? TRANSURETHRAL RESECTION OF BLADDER TUMOR    ? ?Social History:  reports that she has never smoked. She has never used smokeless tobacco. She reports that she does not drink alcohol and does not use drugs. ? ?Allergies  ?Allergen Reactions  ? Codeine Itching, Nausea Only and Swelling  ? Flexeril [Cyclobenzaprine]   ?  Per Records from [place]   ? ? ?Family History  ?Problem Relation Age of Onset  ? Transient ischemic attack Mother 27  ? Heart attack Father 65  ? Pancreatic cancer Sister 66  ? Epilepsy Brother 75  ? Diabetes Brother 7  ? Dementia Sister 37  ? Diabetes Brother   ? Stroke Brother 35  ? Alcoholism Brother 39  ? Cancer - Other Son 69  ?     liver cancer; [place] V[place]  ? Heart attack Son 40  ? Diabetes Son   ? Anuerysm Daughter 57  ?     brain  ? ? ?Prior to Admission medications   ?Medication Sig Start Date End Date Taking? Authorizing Provider  ?acetaminophen (TYLENOL) 500 MG tablet Take 500 mg by mouth daily as needed (pain).   Yes [provider]  ?alendronate (FOSAMAX) 70 MG tablet TAKE 1 TABLET BY MOUTH ONCE A WEEK WITH A FULL GLASS OF WATER ON AN EMPTY STOMACH 04/15/20  Yes Reed, Tiffany L, DO  ?ASPIRIN LOW DOSE 81 MG EC tablet Take 81 mg by mouth daily. 06/09/21  Yes [provider]  ?carbamide peroxide (DEBROX) 6.5 % OTIC solution Place 5 drops into both ears at bedtime as needed (x 1 week). ?Patient taking differently: Place 1 drop into both ears See admin instructions. 1-2 times a week as needed 11/23/19  Yes Reed, Tiffany L, DO  ?Cholecalciferol (D3 HIGH POTENCY) 50 MCG (2000 UT) CAPS Take 1 capsule (2,000 Units total) by mouth daily. Needs an appointment before anymore future refills. 07/23/20  Yes Wardell Honour, MD  ?diphenoxylate-atropine (LOMOTIL) 2.5-0.025 MG tablet TAKE 1 TABLET BY MOUTH EVERY MORNING, 1 TABLET AT LUNCH, AND 1 TABLET AT dinner ?Patient taking differently: Take 1 tablet by mouth in the morning, at noon, and at bedtime. 03/26/20   Yes Reed, Tiffany L, DO  ?hydrALAZINE (APRESOLINE) 25 MG tablet Take 25 mg by mouth 3 (three) times daily. 06/09/21  Yes [provider]  ?iron polysaccharides (FERREX 150) 150 MG capsule Take one capsule by mouth once daily. Needs an appointment before anymore future refills. ?Patient taking differently: Take 150 mg by mouth daily. Take one capsule by mouth once daily. Needs an appointment before anymore future refills. 07/23/20  Yes Wardell Honour, MD  ?JARDIANCE 10 MG TABS tablet Take 10 mg by mouth daily. 06/09/21  Yes [provider]  ?loperamide (IMODIUM) 2 MG capsule TAKE 1 CAPSULE BY MOUTH EVERY MORNING, 1 CAPSULE AT LUNCH, AND 1 CAPSULE AT dinner ?Patient taking differently: Take 2 mg by mouth in the morning, at noon, and at bedtime. 03/26/20  Yes Reed, Tiffany L, DO  ?ondansetron (ZOFRAN-ODT) 4 MG disintegrating tablet Take 1 tablet (4 mg total) by mouth every 8 (eight) hours as needed for nausea or vomiting. ?Patient taking differently: Take 4 mg by mouth daily as needed for nausea or vomiting. 06/05/19  Yes Reed, Tiffany L, DO  ?psyllium (METAMUCIL) 58.6 % packet Take 1 packet by mouth at bedtime. 02/09/18  Yes Reed, Tiffany L, DO  ?rosuvastatin (CRESTOR) 10 MG tablet Take 10 mg by mouth daily. 06/09/21  Yes [provider]  ?sodium bicarbonate 650 MG tablet Take 1 tablet (650 mg total) by mouth 2 (two) times daily. Needs an appointment before anymore future refills. 07/23/20  Yes Wardell Honour, MD  ?traMADol Veatrice Bourbon) 50 MG tablet Take one tablet by mouth every twelve hours as needed for severe pain. Needs an appointment before anymore future refills. ?Patient taking differently: Take 50 mg by mouth daily as needed (pain). 07/23/20  Yes Wardell Honour, MD  ?vitamin C (ASCORBIC ACID) 500 MG tablet Take 1 tablet (500 mg total) by mouth daily. Needs an appointment before anymore future refills. 07/23/20  Yes Wardell Honour, MD  ?zolpidem (AMBIEN) 5 MG tablet TAKE 1 TABLET BY MOUTH  AT BEDTIME AS NEEDED FOR SLEEP ?Patient taking differently: Take 5 mg by mouth at bedtime as needed for sleep. 03/26/20  Yes Reed, Tiffany L, DO  ?acetaminophen (TYLENOL) 325 MG tablet Take 650 mg by mout

## 2021-06-24 NOTE — ED Provider Notes (Signed)
?[facility] ?Provider Note ? ? ?CSN: [id number] ?Arrival date & time: 06/24/21  0901 ? ?  ? ?History ? ?Chief Complaint  ?Patient presents with  ? n/v  ? Shortness of Breath  ? ? ?Cheryl Salas is a 88 y.o. female. ? ?Pt with nausea/vomiting, acute onset last night/early AM. Emesis not bloody or bilious. Also c/o mid abd pain/discomfort, dull, moderate, non radiating. Has ostomy for years - surgery ?after diverticular perforation/obstruction. Is having normal/brown/loose ostomy output. Mild abd distension. No fever or chills. No dysuria or gu c/o. No chest pain. Has felt mildly sob this AM. No cough or uri symptoms.  ? ?The history is provided by the patient, a relative, medical records and the EMS personnel.  ?Shortness of Breath ?Associated symptoms: abdominal pain and vomiting   ?Associated symptoms: no chest pain, no cough, no fever, no headaches, no neck pain, no rash and no sore throat   ? ?  ? ?Home Medications ?Prior to Admission medications   ?Medication Sig Start Date End Date Taking? Authorizing Provider  ?acetaminophen (TYLENOL) 325 MG tablet Take 650 mg by mouth as needed.    [provider]  ?alendronate (FOSAMAX) 70 MG tablet TAKE 1 TABLET BY MOUTH ONCE A WEEK WITH A FULL GLASS OF WATER ON AN EMPTY STOMACH 04/15/20   Reed, Tiffany L, DO  ?carbamide peroxide (DEBROX) 6.5 % OTIC solution Place 5 drops into both ears at bedtime as needed (x 1 week). 11/23/19   Reed, [place], DO  ?Cholecalciferol (D3 HIGH POTENCY) 50 MCG (2000 UT) CAPS Take 1 capsule (2,000 Units total) by mouth daily. Needs an appointment before anymore future refills. 07/23/20   Wardell Honour, MD  ?diphenoxylate-atropine (LOMOTIL) 2.5-0.025 MG tablet TAKE 1 TABLET BY MOUTH EVERY MORNING, 1 TABLET AT LUNCH, AND 1 TABLET AT dinner 03/26/20   Reed, Tiffany L, DO  ?iron polysaccharides (FERREX 150) 150 MG capsule Take one capsule by mouth once daily. Needs an appointment before anymore future  refills. 07/23/20   Wardell Honour, MD  ?loperamide (IMODIUM) 2 MG capsule TAKE 1 CAPSULE BY MOUTH EVERY MORNING, 1 CAPSULE AT LUNCH, AND 1 CAPSULE AT dinner 03/26/20   Reed, Tiffany L, DO  ?ondansetron (ZOFRAN-ODT) 4 MG disintegrating tablet Take 1 tablet (4 mg total) by mouth every 8 (eight) hours as needed for nausea or vomiting. 06/05/19   Reed, Tiffany L, DO  ?pioglitazone (ACTOS) 15 MG tablet Take 1 tablet (15 mg total) by mouth daily. Patient needs an appointment before anymore future refills. 07/23/20   Wardell Honour, MD  ?psyllium (METAMUCIL) 58.6 % packet Take 1 packet by mouth at bedtime. 02/09/18   Reed, Tiffany L, DO  ?simethicone (MYLICON) 264 MG chewable tablet Chew 125 mg by mouth as needed for flatulence. GasX    [provider]  ?sodium bicarbonate 650 MG tablet Take 1 tablet (650 mg total) by mouth 2 (two) times daily. Needs an appointment before anymore future refills. 07/23/20   Wardell Honour, MD  ?traMADol Veatrice Bourbon) 50 MG tablet Take one tablet by mouth every twelve hours as needed for severe pain. Needs an appointment before anymore future refills. 07/23/20   Wardell Honour, MD  ?vitamin C (ASCORBIC ACID) 500 MG tablet Take 1 tablet (500 mg total) by mouth daily. Needs an appointment before anymore future refills. 07/23/20   Wardell Honour, MD  ?zolpidem (AMBIEN) 5 MG tablet TAKE 1 TABLET BY MOUTH AT BEDTIME AS NEEDED FOR  SLEEP 03/26/20   Reed, Tiffany L, DO  ?   ? ?Allergies    ?Codeine and Flexeril [cyclobenzaprine]   ? ?Review of Systems   ?Review of Systems  ?Constitutional:  Negative for chills and fever.  ?HENT:  Negative for sore throat.   ?Eyes:  Negative for redness.  ?Respiratory:  Positive for shortness of breath. Negative for cough.   ?Cardiovascular:  Negative for chest pain and leg swelling.  ?Gastrointestinal:  Positive for abdominal pain, nausea and vomiting.  ?Genitourinary:  Negative for dysuria and flank pain.  ?Musculoskeletal:  Negative for neck pain.   ?Skin:  Negative for rash.  ?Neurological:  Negative for headaches.  ?Hematological:  Does not bruise/bleed easily.  ?Psychiatric/Behavioral:  Negative for confusion.   ? ?Physical Exam ?Updated Vital Signs ?BP (!) 180/109   Pulse (!) 110   Temp 98.4 ?F (36.9 ?C) (Oral)   Resp 20   SpO2 99%  ?Physical Exam ?Vitals and nursing note reviewed.  ?Constitutional:   ?   Appearance: Normal appearance. She is well-developed.  ?HENT:  ?   Head: Atraumatic.  ?   Nose: Nose normal.  ?   Mouth/Throat:  ?   Mouth: Mucous membranes are moist.  ?Eyes:  ?   General: No scleral icterus. ?   Conjunctiva/sclera: Conjunctivae normal.  ?Neck:  ?   Trachea: No tracheal deviation.  ?Cardiovascular:  ?   Rate and Rhythm: Regular rhythm. Tachycardia present.  ?   Pulses: Normal pulses.  ?   Heart sounds: Normal heart sounds. No murmur heard. ?  No friction rub. No gallop.  ?Pulmonary:  ?   Effort: Pulmonary effort is normal. No respiratory distress.  ?   Breath sounds: Normal breath sounds.  ?Abdominal:  ?   General: Bowel sounds are normal. There is no distension.  ?   Palpations: Abdomen is soft.  ?   Tenderness: There is abdominal tenderness.  ?   Comments: Mid abd tenderness. Soft, reducible ventral hernia. Ostomy, pink, patent, functioning, medium brown stool in bag.   ?Genitourinary: ?   Comments: No cva tenderness.  ?Musculoskeletal:     ?   General: No swelling or tenderness.  ?   Cervical back: Normal range of motion and neck supple. No rigidity. No muscular tenderness.  ?Skin: ?   General: Skin is warm and dry.  ?   Findings: No rash.  ?Neurological:  ?   Mental Status: She is alert.  ?   Comments: Alert, speech normal.   ?Psychiatric:     ?   Mood and Affect: Mood normal.  ? ? ?ED Results / Procedures / Treatments   ?Labs ?(all labs ordered are listed, but only abnormal results are displayed) ?Results for orders placed or performed during the hospital encounter of 06/24/21  ?Lipase, blood  ?Result Value Ref Range  ? Lipase  59 (H) 11 - 51 U/L  ?Comprehensive metabolic panel  ?Result Value Ref Range  ? Sodium 139 135 - 145 mmol/L  ? Potassium 4.0 3.5 - 5.1 mmol/L  ? Chloride 106 98 - 111 mmol/L  ? CO2 21 (L) 22 - 32 mmol/L  ? Glucose, Bld 251 (H) 70 - 99 mg/dL  ? BUN 26 (H) 8 - 23 mg/dL  ? Creatinine, Ser 1.68 (H) 0.44 - 1.00 mg/dL  ? Calcium 9.7 8.9 - 10.3 mg/dL  ? Total Protein 7.2 6.5 - 8.1 g/dL  ? Albumin 4.3 3.5 - 5.0 g/dL  ? AST 22 15 -  41 U/L  ? ALT 22 0 - 44 U/L  ? Alkaline Phosphatase 37 (L) 38 - 126 U/L  ? Total Bilirubin 0.7 0.3 - 1.2 mg/dL  ? GFR, Estimated 28 (L) >60 mL/min  ? Anion gap 12 5 - 15  ?CBC  ?Result Value Ref Range  ? WBC 7.5 4.0 - 10.5 K/uL  ? RBC 3.66 (L) 3.87 - 5.11 MIL/uL  ? Hemoglobin 11.8 (L) 12.0 - 15.0 g/dL  ? HCT 35.1 (L) 36.0 - 46.0 %  ? MCV 95.9 80.0 - 100.0 fL  ? MCH 32.2 26.0 - 34.0 pg  ? MCHC 33.6 30.0 - 36.0 g/dL  ? RDW 13.4 11.5 - 15.5 %  ? Platelets 214 150 - 400 K/uL  ? nRBC 0.0 0.0 - 0.2 %  ?Urinalysis, Routine w reflex microscopic  ?Result Value Ref Range  ? Color, Urine YELLOW YELLOW  ? APPearance CLEAR CLEAR  ? Specific Gravity, Urine 1.016 1.005 - 1.030  ? pH 5.0 5.0 - 8.0  ? Glucose, UA >=500 (A) NEGATIVE mg/dL  ? Hgb urine dipstick NEGATIVE NEGATIVE  ? Bilirubin Urine NEGATIVE NEGATIVE  ? Ketones, ur NEGATIVE NEGATIVE mg/dL  ? Protein, ur NEGATIVE NEGATIVE mg/dL  ? Nitrite NEGATIVE NEGATIVE  ? Leukocytes,Ua TRACE (A) NEGATIVE  ? RBC / HPF 0-5 0 - 5 RBC/hpf  ? WBC, UA 6-10 0 - 5 WBC/hpf  ? Bacteria, UA RARE (A) NONE SEEN  ? Squamous Epithelial / LPF 0-5 0 - 5  ?Magnesium  ?Result Value Ref Range  ? Magnesium 1.7 1.7 - 2.4 mg/dL  ? ?ECHOCARDIOGRAM COMPLETE ? ?Result Date: 06/03/2021 ?   ECHOCARDIOGRAM REPORT   Patient Name:   Cheryl Salas  Date of Exam: 06/03/2021 Medical Rec #:  [id number]     Height:       59.0 in Accession #:    [id number]    Weight:       128.6 lb Date of Birth:  03/05/1925    BSA:          1.529 m? Patient Age:    52 years      BP:           120/70 mmHg Patient Gender: F              HR:           108 bpm. Exam Location:  [address] Procedure: 2D Echo, Cardiac Doppler and Color Doppler Indications:    R06.00 Dyspnea  History:        Patient has no prior history of Echocardiog

## 2021-06-24 NOTE — Assessment & Plan Note (Signed)
hgb at baseline of 10-11 ?Followed closely with PCP ?Continue to monitor  ?

## 2021-06-24 NOTE — ED Provider Triage Note (Signed)
Emergency Medicine Provider Triage Evaluation Note ? ?Cheryl Salas , a 88 y.o. female  was evaluated in triage.  She has a past medical history of diabetes, hypertension, CKD stage III and bowel obstruction.  She is presenting today with nausea, abdominal pain and vomiting that started yesterday.  Still having normal bowel movements.  Vomitus is not bloody.  Denies fevers or chills.  Has a history of ileostomy secondary to partial colonectomy after bowel obstruction. ? ? ?Reports chronic back pain, no chest pain. ?Review of Systems  ?Positive: Abdominal pain, nausea, vomiting ?Negative: Chest pain, fevers, chills, diarrhea, constipation or difficulty breathing ? ?Physical Exam  ?BP (!) 174/101 (BP Location: Left Arm)   Pulse (!) 108   Temp 98.4 ?F (36.9 ?C) (Oral)   Resp 16   SpO2 100%  ?Gen:   Awake, no distress   ?Resp:  Normal effort  ?MSK:   Moves extremities without difficulty  ?Other:  Ileostomy appears to be draining, without complications.  Mild distention of the abdomen, no obvious bruising.  Tenderness is diffuse.  RRR ? ?Medical Decision Making  ?Medically screening exam initiated at 9:19 AM.  Appropriate orders placed.  Cheryl Salas was informed that the remainder of the evaluation will be completed by another provider, this initial triage assessment does not replace that evaluation, and the importance of remaining in the ED until their evaluation is complete. ? ? ?  ?Rhae Hammock, PA-C ?06/24/21 2694 ? ?

## 2021-06-24 NOTE — Assessment & Plan Note (Addendum)
Last a1c of 7.0 in march of 2022  ?Hold oral meds while NPO and in hospital ?SSI and accuchecks Q4 while NPO  ?

## 2021-06-24 NOTE — Assessment & Plan Note (Addendum)
Secondary to complication from diverticulitis 5 years ago in [place] ?Ileostomy care  ?

## 2021-06-25 ENCOUNTER — Inpatient Hospital Stay (HOSPITAL_COMMUNITY): Payer: Medicare Other

## 2021-06-25 ENCOUNTER — Observation Stay (HOSPITAL_COMMUNITY): Payer: Medicare Other

## 2021-06-25 DIAGNOSIS — Z8551 Personal history of malignant neoplasm of bladder: Secondary | ICD-10-CM | POA: Diagnosis not present

## 2021-06-25 DIAGNOSIS — Z9013 Acquired absence of bilateral breasts and nipples: Secondary | ICD-10-CM | POA: Diagnosis not present

## 2021-06-25 DIAGNOSIS — M109 Gout, unspecified: Secondary | ICD-10-CM | POA: Diagnosis present

## 2021-06-25 DIAGNOSIS — Z7983 Long term (current) use of bisphosphonates: Secondary | ICD-10-CM | POA: Diagnosis not present

## 2021-06-25 DIAGNOSIS — E876 Hypokalemia: Secondary | ICD-10-CM | POA: Diagnosis present

## 2021-06-25 DIAGNOSIS — I129 Hypertensive chronic kidney disease with stage 1 through stage 4 chronic kidney disease, or unspecified chronic kidney disease: Secondary | ICD-10-CM | POA: Diagnosis present

## 2021-06-25 DIAGNOSIS — K435 Parastomal hernia without obstruction or  gangrene: Secondary | ICD-10-CM | POA: Diagnosis present

## 2021-06-25 DIAGNOSIS — E1142 Type 2 diabetes mellitus with diabetic polyneuropathy: Secondary | ICD-10-CM | POA: Diagnosis present

## 2021-06-25 DIAGNOSIS — Z888 Allergy status to other drugs, medicaments and biological substances status: Secondary | ICD-10-CM | POA: Diagnosis not present

## 2021-06-25 DIAGNOSIS — Z885 Allergy status to narcotic agent status: Secondary | ICD-10-CM | POA: Diagnosis not present

## 2021-06-25 DIAGNOSIS — K56609 Unspecified intestinal obstruction, unspecified as to partial versus complete obstruction: Secondary | ICD-10-CM | POA: Diagnosis present

## 2021-06-25 DIAGNOSIS — R519 Headache, unspecified: Secondary | ICD-10-CM | POA: Diagnosis present

## 2021-06-25 DIAGNOSIS — K565 Intestinal adhesions [bands], unspecified as to partial versus complete obstruction: Secondary | ICD-10-CM | POA: Diagnosis present

## 2021-06-25 DIAGNOSIS — K449 Diaphragmatic hernia without obstruction or gangrene: Secondary | ICD-10-CM | POA: Diagnosis present

## 2021-06-25 DIAGNOSIS — J029 Acute pharyngitis, unspecified: Secondary | ICD-10-CM | POA: Diagnosis present

## 2021-06-25 DIAGNOSIS — Z79899 Other long term (current) drug therapy: Secondary | ICD-10-CM | POA: Diagnosis not present

## 2021-06-25 DIAGNOSIS — Z833 Family history of diabetes mellitus: Secondary | ICD-10-CM | POA: Diagnosis not present

## 2021-06-25 DIAGNOSIS — E1122 Type 2 diabetes mellitus with diabetic chronic kidney disease: Secondary | ICD-10-CM | POA: Diagnosis present

## 2021-06-25 DIAGNOSIS — E782 Mixed hyperlipidemia: Secondary | ICD-10-CM | POA: Diagnosis present

## 2021-06-25 DIAGNOSIS — Z853 Personal history of malignant neoplasm of breast: Secondary | ICD-10-CM | POA: Diagnosis not present

## 2021-06-25 DIAGNOSIS — D509 Iron deficiency anemia, unspecified: Secondary | ICD-10-CM | POA: Diagnosis present

## 2021-06-25 DIAGNOSIS — Z8249 Family history of ischemic heart disease and other diseases of the circulatory system: Secondary | ICD-10-CM | POA: Diagnosis not present

## 2021-06-25 DIAGNOSIS — R0602 Shortness of breath: Secondary | ICD-10-CM | POA: Diagnosis present

## 2021-06-25 DIAGNOSIS — Z932 Ileostomy status: Secondary | ICD-10-CM | POA: Diagnosis not present

## 2021-06-25 DIAGNOSIS — Z7982 Long term (current) use of aspirin: Secondary | ICD-10-CM | POA: Diagnosis not present

## 2021-06-25 DIAGNOSIS — N1832 Chronic kidney disease, stage 3b: Secondary | ICD-10-CM | POA: Diagnosis present

## 2021-06-25 LAB — GLUCOSE, CAPILLARY
Glucose-Capillary: 144 mg/dL — ABNORMAL HIGH (ref 70–99)
Glucose-Capillary: 118 mg/dL — ABNORMAL HIGH (ref 70–99)
Glucose-Capillary: 127 mg/dL — ABNORMAL HIGH (ref 70–99)
Glucose-Capillary: 92 mg/dL (ref 70–99)
Glucose-Capillary: 102 mg/dL — ABNORMAL HIGH (ref 70–99)

## 2021-06-25 LAB — BASIC METABOLIC PANEL WITH GFR
Anion gap: 9 (ref 5–15)
BUN: 22 mg/dL (ref 8–23)
CO2: 16 mmol/L — ABNORMAL LOW (ref 22–32)
Calcium: 8.5 mg/dL — ABNORMAL LOW (ref 8.9–10.3)
Chloride: 112 mmol/L — ABNORMAL HIGH (ref 98–111)
Creatinine, Ser: 1.49 mg/dL — ABNORMAL HIGH (ref 0.44–1.00)
GFR, Estimated: 32 mL/min — ABNORMAL LOW
Glucose, Bld: 136 mg/dL — ABNORMAL HIGH (ref 70–99)
Potassium: 3.7 mmol/L (ref 3.5–5.1)
Sodium: 137 mmol/L (ref 135–145)

## 2021-06-25 LAB — CBC
HCT: 30.4 % — ABNORMAL LOW (ref 36.0–46.0)
Hemoglobin: 9.9 g/dL — ABNORMAL LOW (ref 12.0–15.0)
MCH: 31.1 pg (ref 26.0–34.0)
MCHC: 32.6 g/dL (ref 30.0–36.0)
MCV: 95.6 fL (ref 80.0–100.0)
Platelets: 187 10*3/uL (ref 150–400)
RBC: 3.18 MIL/uL — ABNORMAL LOW (ref 3.87–5.11)
RDW: 13.1 % (ref 11.5–15.5)
WBC: 8 10*3/uL (ref 4.0–10.5)
nRBC: 0 % (ref 0.0–0.2)

## 2021-06-25 MED ORDER — ACETAMINOPHEN 10 MG/ML IV SOLN
1000.0000 mg | Freq: Three times a day (TID) | INTRAVENOUS | Status: AC | PRN
  Administered 2021-06-25 – 2021-06-26 (×3): 1000 mg via INTRAVENOUS
  Filled 2021-06-25 (×3): qty 100

## 2021-06-25 MED ORDER — BENZOCAINE 20 % MT AERO
INHALATION_SPRAY | Freq: Three times a day (TID) | OROMUCOSAL | Status: DC | PRN
  Filled 2021-06-25: qty 57

## 2021-06-25 NOTE — Progress Notes (Signed)
?PROGRESS NOTE ? ?Cheryl Salas  ATF:[id number] DOB: 03/05/1925 DOA: 06/24/2021 ?PCP: Gayland Curry, DO  ? ?Brief Narrative: ? ?Patient is a 88 year old female with medical history of CKD stage IIIb, diabetes type 2, hypertension, hyperlipidemia, diverticulitis status post ileostomy, hysterectomy/ bladder suspension, history of bladder/breast cancer with bilateral mastectomy who presented with complaint of abdominal pain, nausea, vomiting from home.  She noticed decreased output from her colostomy.  On presentation she was hypertensive, tachycardic.  CT abdomen showed bowel obstruction with both gastric/proximal small bowel distention.  Patient was admitted for the management of SBO, general surgery following. ? ? ?Assessment & Plan: ? ?Principal Problem: ?  Small bowel obstruction ([place]) ?Active Problems: ?  Type 2 diabetes mellitus with complication, without long-term current use of insulin ([place]) ?  Chronic kidney disease, stage 3, mod decreased GFR ([facility]) ?  Iron deficiency anemia ?  Ileostomy status ([place]) ?  Essential hypertension ?  Hyperlipidemia ? ?SBO: Presented with abdominal pain, nausea, vomiting.  History of abdominal surgeries in the past.  Status post ileostomy. ?Started on conservative management, NG tube placed.  Continue IV fluids, pain management, antiemetics.  General surgery following. ?Her abdomen pain has significantly improved today.  Good bowel sounds heard.  There was stool in the ileostomy ? ?Type 2 diabetes: Last A1c of 7 in March 2022.  Continue sliding scale insulin for now.  Monitor blood sugars ? ?CKD stage IIIb: Baseline creatinine in the range of 1.4-1.6.  Currently at baseline ? ?Iron deficiency anemia: Baseline hemoglobin about 10-11.  Drop in hemoglobin is most likely secondary to hemodilution from IV fluids ? ?Status post ileostomy: History of diverticulitis.  Continue ileostomy care ? ?Hyperlipidemia: On Crestor at home ? ?Hypertension: Continue current medications for severe  hypertension.  Oral medications on hold.  Monitor blood pressure ?  ? ? ?  ?  ? ?DVT prophylaxis:enoxaparin (LOVENOX) injection 30 mg Start: 06/24/21 1615 ?SCDs Start: 06/24/21 1415 ? ? ?  Code Status: Partial Code ? ?Family Communication: Son at the bedside ? ?Patient status:Inpatient ? ?Patient is from :Home ? ?Anticipated discharge YH:[facility] ? ?Estimated DC date: In 1 to 2 days after surgical clearance ? ? ?Consultants: General surgery ? ?Procedures:None ? ?Antimicrobials:  ?Anti-infectives (From admission, onward)  ? ? None  ? ?  ? ? ?Subjective: ?Patient seen and examined at the bedside this morning.  Hemodynamically stable.  Son at the bedside.  Her abdomen pain is improved.  She denies any nausea or vomiting.  Stool was noted in the ileostomy bag ? ?Objective: ?Vitals:  ? 06/24/21 1900 06/24/21 2000 06/24/21 2038 06/25/21 0631  ?BP: (!) 160/83 (!) 151/89 (!) 162/89 (!) 157/83  ?Pulse: 92 99 (!) 103 (!) 108  ?Resp:   18 18  ?Temp:   99.1 ?F (37.3 ?C) 98.4 ?F (36.9 ?C)  ?TempSrc:   Oral Oral  ?SpO2: 94% 95% 100% 96%  ? ? ?Intake/Output Summary (Last 24 hours) at 06/25/2021 0741 ?Last data filed at 06/25/2021 7628 ?Gross per 24 hour  ?Intake 0 ml  ?Output 650 ml  ?Net -650 ml  ? ?There were no vitals filed for this visit. ? ?Examination: ? ?General exam: Overall comfortable, not in distress ?HEENT: PERRL, NG tube ?Respiratory system:  no wheezes or crackles  ?Cardiovascular system: S1 & S2 heard, RRR.  ?Gastrointestinal system: Abdomen is nondistended, soft and nontender.  Ileostomy, bowel sounds heard ?Central nervous system: Alert and oriented ?Extremities: No edema, no clubbing ,no cyanosis ?Skin: No rashes, no ulcers,no  icterus   ? ? ?Data Reviewed: I have personally reviewed following labs and imaging studies ? ?CBC: ?Recent Labs  ?Lab 06/24/21 ?2706 06/25/21 ?0045  ?WBC 7.5 8.0  ?HGB 11.8* 9.9*  ?HCT 35.1* 30.4*  ?MCV 95.9 95.6  ?PLT 214 187  ? ?Basic Metabolic Panel: ?Recent Labs  ?Lab 06/24/21 ?2376  06/25/21 ?0045  ?NA 139 137  ?K 4.0 3.7  ?CL 106 112*  ?CO2 21* 16*  ?GLUCOSE 251* 136*  ?BUN 26* 22  ?CREATININE 1.68* 1.49*  ?CALCIUM 9.7 8.5*  ?MG 1.7  --   ? ? ? ?No results found for this or any previous visit (from the past 240 hour(s)).  ? ?Radiology Studies: ?CT ABDOMEN PELVIS WO CONTRAST ? ?Result Date: 06/24/2021 ?CLINICAL DATA:  88 year old female with acute abdominal pain. EXAM: CT ABDOMEN AND PELVIS WITHOUT CONTRAST TECHNIQUE: Multidetector CT imaging of the abdomen and pelvis was performed following the standard protocol without IV contrast. RADIATION DOSE REDUCTION: This exam was performed according to the departmental dose-optimization program which includes automated exposure control, adjustment of the mA and/or kV according to patient size and/or use of iterative reconstruction technique. COMPARISON:  None FINDINGS: Lower chest: Minimal basilar scarring atelectasis. Large hiatal hernia. Signs of coronary artery disease. Heart is incompletely imaged. Hepatobiliary: Noncontrast appearance of liver is unremarkable. Post cholecystectomy with mild extrahepatic biliary duct distension. Pancreas: Signs of pancreatic atrophy without signs of inflammation. Spleen: Normal in size and contour with signs of prior granulomatous disease. Adrenals/Urinary Tract: Adrenal glands are normal. Urinary bladder is nondistended without signs of adjacent inflammation. Smooth contour the bilateral kidneys without signs of nephrolithiasis or hydronephrosis. 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 Rectal pouch. Moderate to marked distension of the stomach with large hiatal hernia. Signs of bowel obstruction with dilated jejunum in the pelvis, focal outpouching from the jejunum on image 49/3 measuring 2.7 x 1.8 cm near the site of bowel transition. Second less distended loop of bowel just beyond the site of transition extending from the pelvis into the lower abdomen. No  substantial stranding around these areas on the current study small areas of gas-filled outpouching from other less distended loops of jejunum compatible with jejunal diverticulosis. Distal small bowel loops herniated through a midline hernia with rectus diastasis. Site of obstruction not in this area nor associated with parastomal herniation of small bowel loops about the RIGHT lower quadrant ileostomy. No signs of pneumatosis. No ascites. Vascular/Lymphatic: Aortic atherosclerosis, no aneurysmal dilation. No adenopathy in the abdomen or in the pelvis. Reproductive: Post hysterectomy without sign of adnexal mass. Other: No ascites or free air. Musculoskeletal: Osteopenia. Signs of prior trauma to the bony pelvis with healed bilateral pubic bone fractures. Healed rib fractures about the LEFT chest. T12 compression fracture with cement augmentation near complete loss of height at this level. Dextroconvex scoliotic curvature and degenerative changes of the lumbar spine. IMPRESSION: 1. Signs of bowel obstruction with both gastric and proximal small bowel distension. Stool like material in the lumen of the dilated small bowel raising the question of some chronicity. Findings likely due to complex adhesions in the pelvis and associated with completely decompressed or nearly completely decompressed bowel beyond the site of obstruction. 2. Focal outpouching from the jejunum with other smaller areas of outpouching compatible with jejunal diverticulosis. No current stranding surrounding these areas or signs of free air. No priors are available for comparison. If the patient's symptoms should worsen would consider evaluation with enteric contrast. 3.  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 4. Large hiatal hernia with distended stomach extending into the chest. 5. Ventral abdominal wall hernia and parastomal hernia containing small bowel loops. These areas do not represent the site of  obstruction. 6. Signs of coronary artery disease. 7. Osteopenia with signs of prior trauma to the bony pelvis with healed bilateral pubic bone fractures. T12 compression fracture with cement augmentation near complete loss of he

## 2021-06-25 NOTE — Progress Notes (Signed)
? ?Progress Note ? ?   ?Subjective: ?Tachycardic and hypertensive ?Having output into ileostomy ?Feeling about 75% better from yesterday - abdominal pain improved, no nausea or emesis. Having some throat pain from NGT which made it feel difficult to breath overnight. No respiratory complaints this am. ? ?Son is bedside ? ?Objective: ?Vital signs in last 24 hours: ?Temp:  [98.2 ?F (36.8 ?C)-99.1 ?F (37.3 ?C)] 98.2 ?F (36.8 ?C) (04/19 0746) ?Pulse Rate:  [92-110] 104 (04/19 0746) ?Resp:  [14-20] 14 (04/19 0746) ?BP: (144-180)/(83-109) 144/85 (04/19 0746) ?SpO2:  [94 %-100 %] 96 % (04/19 0746) ?Last BM Date : 06/24/21 ? ?Intake/Output from previous day: ?04/18 0701 - 04/19 0700 ?In: 0  ?Out: 650 [Urine:300; Stool:350] ?Intake/Output this shift: ?No intake/output data recorded. ? ?PE: ?General: pleasant, WD, female who is laying in bed in NAD ?Lungs: Respiratory effort nonlabored on room ?Abd: soft, NT, ND, +BS, soft reducible ventral and parastomal hernias. Ileostomy with air and small amount of stool in bag. NGT in place with scant bilious fluid in tubing ?MSK: all 4 extremities are symmetrical with no cyanosis, clubbing, or edema. ?Skin: warm and dry ?Psych: A&Ox3 with an appropriate affect.  ? ? ?Lab Results:  ?Recent Labs  ?  06/24/21 ?5809 06/25/21 ?0045  ?WBC 7.5 8.0  ?HGB 11.8* 9.9*  ?HCT 35.1* 30.4*  ?PLT 214 187  ? ?BMET ?Recent Labs  ?  06/24/21 ?9833 06/25/21 ?0045  ?NA 139 137  ?K 4.0 3.7  ?CL 106 112*  ?CO2 21* 16*  ?GLUCOSE 251* 136*  ?BUN 26* 22  ?CREATININE 1.68* 1.49*  ?CALCIUM 9.7 8.5*  ? ?PT/INR ?No results for input(s): LABPROT, INR in the last 72 hours. ?CMP  ?   ?Component Value Date/Time  ? NA 137 06/25/2021 0045  ? NA 138 09/23/2017 0000  ? K 3.7 06/25/2021 0045  ? CL 112 (H) 06/25/2021 0045  ? CO2 16 (L) 06/25/2021 0045  ? GLUCOSE 136 (H) 06/25/2021 0045  ? BUN 22 06/25/2021 0045  ? BUN 49 (A) 09/23/2017 0000  ? CREATININE 1.49 (H) 06/25/2021 0045  ? CREATININE 1.49 (H) 05/13/2020 0900  ?  CALCIUM 8.5 (L) 06/25/2021 0045  ? PROT 7.2 06/24/2021 0934  ? ALBUMIN 4.3 06/24/2021 0934  ? AST 22 06/24/2021 0934  ? ALT 22 06/24/2021 0934  ? ALKPHOS 37 (L) 06/24/2021 0934  ? BILITOT 0.7 06/24/2021 0934  ? GFRNONAA 32 (L) 06/25/2021 0045  ? GFRNONAA 30 (L) 05/13/2020 0900  ? GFRAA 35 (L) 05/13/2020 0900  ? ?Lipase  ?   ?Component Value Date/Time  ? LIPASE 59 (H) 06/24/2021 0934  ? ? ? ? ? ?Studies/Results: ?CT ABDOMEN PELVIS WO CONTRAST ? ?Result Date: 06/24/2021 ?CLINICAL DATA:  88 year old female with acute abdominal pain. EXAM: CT ABDOMEN AND PELVIS WITHOUT CONTRAST TECHNIQUE: Multidetector CT imaging of the abdomen and pelvis was performed following the standard protocol without IV contrast. RADIATION DOSE REDUCTION: This exam was performed according to the departmental dose-optimization program which includes automated exposure control, adjustment of the mA and/or kV according to patient size and/or use of iterative reconstruction technique. COMPARISON:  None FINDINGS: Lower chest: Minimal basilar scarring atelectasis. Large hiatal hernia. Signs of coronary artery disease. Heart is incompletely imaged. Hepatobiliary: Noncontrast appearance of liver is unremarkable. Post cholecystectomy with mild extrahepatic biliary duct distension. Pancreas: Signs of pancreatic atrophy without signs of inflammation. Spleen: Normal in size and contour with signs of prior granulomatous disease. Adrenals/Urinary Tract: Adrenal glands are normal. Urinary bladder is nondistended  without signs of adjacent inflammation. Smooth contour the bilateral kidneys without signs of nephrolithiasis or hydronephrosis. 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 Rectal pouch. Moderate to marked distension of the stomach with large hiatal hernia. Signs of bowel obstruction with dilated jejunum in the pelvis, focal outpouching from the jejunum on image 49/3 measuring 2.7 x 1.8 cm near the site of  bowel transition. Second less distended loop of bowel just beyond the site of transition extending from the pelvis into the lower abdomen. No substantial stranding around these areas on the current study small areas of gas-filled outpouching from other less distended loops of jejunum compatible with jejunal diverticulosis. Distal small bowel loops herniated through a midline hernia with rectus diastasis. Site of obstruction not in this area nor associated with parastomal herniation of small bowel loops about the RIGHT lower quadrant ileostomy. No signs of pneumatosis. No ascites. Vascular/Lymphatic: Aortic atherosclerosis, no aneurysmal dilation. No adenopathy in the abdomen or in the pelvis. Reproductive: Post hysterectomy without sign of adnexal mass. Other: No ascites or free air. Musculoskeletal: Osteopenia. Signs of prior trauma to the bony pelvis with healed bilateral pubic bone fractures. Healed rib fractures about the LEFT chest. T12 compression fracture with cement augmentation near complete loss of height at this level. Dextroconvex scoliotic curvature and degenerative changes of the lumbar spine. IMPRESSION: 1. Signs of bowel obstruction with both gastric and proximal small bowel distension. Stool like material in the lumen of the dilated small bowel raising the question of some chronicity. Findings likely due to complex adhesions in the pelvis and associated with completely decompressed or nearly completely decompressed bowel beyond the site of obstruction. 2. Focal outpouching from the jejunum with other smaller areas of outpouching compatible with jejunal diverticulosis. No current stranding surrounding these areas or signs of free air. No priors are available for comparison. If the patient's symptoms should worsen would consider evaluation with enteric contrast. 3. 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 4. Large hiatal hernia with distended stomach extending into the  chest. 5. Ventral abdominal wall hernia and parastomal hernia containing small bowel loops. These areas do not represent the site of obstruction. 6. Signs of coronary artery disease. 7. Osteopenia with signs of prior trauma to the bony pelvis with healed bilateral pubic bone fractures. T12 compression fracture with cement augmentation near complete loss of height at this level. 8. Aortic atherosclerosis. Aortic Atherosclerosis (ICD10-I70.0). Electronically Signed   By: Zetta Bills M.D.   On: 06/24/2021 11:29  ? ?DG Abd Portable 1V ? ?Result Date: 06/25/2021 ?CLINICAL DATA:  88 year old female status post nasogastric tube placement. EXAM: PORTABLE ABDOMEN - 1 VIEW COMPARISON:  06/24/2021. FINDINGS: Tip of nasogastric tube is in the distal body of the stomach. Side port is near the gastroesophageal junction. Visualized bowel gas pattern is unremarkable. Visualized portions of the thorax demonstrate multiple old healed left-sided rib fractures. Atherosclerotic calcifications are also noted in the thoracic aorta. Surgical clips in the right axilla, likely from prior lymph node dissection. Post vertebroplasty changes are noted at the level of T12. Numerous vascular calcifications in the upper abdomen. IMPRESSION: 1. Tip of nasogastric tube is in the distal body of the stomach. Electronically Signed   By: Vinnie Langton M.D.   On: 06/25/2021 07:51  ? ?DG Abd Portable 1V-Small Bowel Protocol-Position Verification ? ?Result Date: 06/24/2021 ?CLINICAL DATA:  Nasogastric tube placement EXAM: PORTABLE ABDOMEN - 1 VIEW COMPARISON:  None. FINDINGS: Lungs  are clear. No pneumothorax or pleural effusion. Nasogastric tube is tortuous with its tip terminating within the hiatal hernia, better seen on CT examination of 06/24/2021. Cardiac size is within normal limits. Pulmonary vascularity is normal. Surgical clips are seen within the right axilla. T12 vertebroplasty has been performed. IMPRESSION: Nasogastric tube tip within the  intrathoracic hiatal hernia. Electronically Signed   By: Fidela Salisbury M.D.   On: 06/24/2021 19:07  ? ?DG Abd Portable 1V-Small Bowel Protocol-Position Verification ? ?Result Date: 06/24/2021 ?CLINICAL DATA:

## 2021-06-25 NOTE — Progress Notes (Signed)
Radiology called to see if pt has been given contrast for her scan that will need to be done 8hrs after contrrast is given per tube. On call provider paged and advised that we wait until the AM to give pt this contrast due to "the risk of aspiration going up if the NG tube is not past the gastro-esophageal hiatus and it doesn;t appear to be in this location and NG tube placement has been attempted multiple tries already. Radiology was called back and advised of this.  ?

## 2021-06-26 ENCOUNTER — Inpatient Hospital Stay (HOSPITAL_COMMUNITY): Payer: Medicare Other

## 2021-06-26 DIAGNOSIS — K56609 Unspecified intestinal obstruction, unspecified as to partial versus complete obstruction: Secondary | ICD-10-CM | POA: Diagnosis not present

## 2021-06-26 LAB — GLUCOSE, CAPILLARY
Glucose-Capillary: 95 mg/dL (ref 70–99)
Glucose-Capillary: 87 mg/dL (ref 70–99)
Glucose-Capillary: 85 mg/dL (ref 70–99)
Glucose-Capillary: 165 mg/dL — ABNORMAL HIGH (ref 70–99)
Glucose-Capillary: 182 mg/dL — ABNORMAL HIGH (ref 70–99)
Glucose-Capillary: 308 mg/dL — ABNORMAL HIGH (ref 70–99)

## 2021-06-26 LAB — BASIC METABOLIC PANEL
Anion gap: 9 (ref 5–15)
BUN: 20 mg/dL (ref 8–23)
CO2: 18 mmol/L — ABNORMAL LOW (ref 22–32)
Calcium: 8.6 mg/dL — ABNORMAL LOW (ref 8.9–10.3)
Chloride: 113 mmol/L — ABNORMAL HIGH (ref 98–111)
Creatinine, Ser: 1.53 mg/dL — ABNORMAL HIGH (ref 0.44–1.00)
GFR, Estimated: 31 mL/min — ABNORMAL LOW (ref >60)
Glucose, Bld: 92 mg/dL (ref 70–99)
Potassium: 4 mmol/L (ref 3.5–5.1)
Sodium: 140 mmol/L (ref 135–145)

## 2021-06-26 MED ORDER — HYDRALAZINE HCL 25 MG PO TABS
25.0000 mg | ORAL_TABLET | Freq: Three times a day (TID) | ORAL | Status: DC
  Administered 2021-06-26 – 2021-06-28 (×6): 25 mg via ORAL
  Filled 2021-06-26 (×6): qty 1

## 2021-06-26 MED ORDER — BOOST / RESOURCE BREEZE PO LIQD CUSTOM
1.0000 | Freq: Three times a day (TID) | ORAL | Status: DC
  Administered 2021-06-26 – 2021-06-28 (×5): 1 via ORAL

## 2021-06-26 MED ORDER — ACETAMINOPHEN 10 MG/ML IV SOLN
1000.0000 mg | Freq: Four times a day (QID) | INTRAVENOUS | Status: DC
  Administered 2021-06-26 – 2021-06-27 (×3): 1000 mg via INTRAVENOUS
  Filled 2021-06-26 (×4): qty 100

## 2021-06-26 NOTE — Progress Notes (Signed)
? ?Progress Note ? ?   ?Subjective: ?NGT is very bothersome - she has a sore throat which hurricane spray is not helping and a headache this am she thinks is related to the tube. She is mostly fixated on this pain and states abdominal pain is better. She does have a little soreness in LLQ on exam ? ?Son is bedside ? ?Objective: ?Vital signs in last 24 hours: ?Temp:  [98.2 ?F (36.8 ?C)-98.7 ?F (37.1 ?C)] 98.6 ?F (37 ?C) (04/20 0755) ?Pulse Rate:  [94-119] 100 (04/20 0755) ?Resp:  [16-19] 18 (04/20 0755) ?BP: (128-155)/(81-98) 146/82 (04/20 0755) ?SpO2:  [96 %-99 %] 97 % (04/20 0755) ?Last BM Date : 06/25/21 ? ?Intake/Output from previous day: ?04/19 0701 - 04/20 0700 ?In: 100 [IV Piggyback:100] ?Out: 150 [Emesis/NG output:100; Stool:50] ?Intake/Output this shift: ?No intake/output data recorded. ? ?PE: ?General: pleasant, WD, female who is laying in bed in NAD ?Lungs: Respiratory effort nonlabored on room air ?Abd: soft, ND, +BS, soft reducible ventral and parastomal hernias. Ileostomy with air and small amount of liquid stool in bag. NGT in place with scant bilious fluid in tubing. None in cannister. ?MSK: all 4 extremities are symmetrical with no cyanosis, clubbing, or edema. ?Skin: warm and dry ?Psych: A&Ox3 with an appropriate affect.  ? ? ?Lab Results:  ?Recent Labs  ?  06/24/21 ?7902 06/25/21 ?0045  ?WBC 7.5 8.0  ?HGB 11.8* 9.9*  ?HCT 35.1* 30.4*  ?PLT 214 187  ? ? ?BMET ?Recent Labs  ?  06/25/21 ?0045 06/26/21 ?4097  ?NA 137 140  ?K 3.7 4.0  ?CL 112* 113*  ?CO2 16* 18*  ?GLUCOSE 136* 92  ?BUN 22 20  ?CREATININE 1.49* 1.53*  ?CALCIUM 8.5* 8.6*  ? ? ?PT/INR ?No results for input(s): LABPROT, INR in the last 72 hours. ?CMP  ?   ?Component Value Date/Time  ? NA 140 06/26/2021 0318  ? NA 138 09/23/2017 0000  ? K 4.0 06/26/2021 0318  ? CL 113 (H) 06/26/2021 0318  ? CO2 18 (L) 06/26/2021 0318  ? GLUCOSE 92 06/26/2021 0318  ? BUN 20 06/26/2021 0318  ? BUN 49 (A) 09/23/2017 0000  ? CREATININE 1.53 (H) 06/26/2021 0318   ? CREATININE 1.49 (H) 05/13/2020 0900  ? CALCIUM 8.6 (L) 06/26/2021 0318  ? PROT 7.2 06/24/2021 0934  ? ALBUMIN 4.3 06/24/2021 0934  ? AST 22 06/24/2021 0934  ? ALT 22 06/24/2021 0934  ? ALKPHOS 37 (L) 06/24/2021 0934  ? BILITOT 0.7 06/24/2021 0934  ? GFRNONAA 31 (L) 06/26/2021 0318  ? GFRNONAA 30 (L) 05/13/2020 0900  ? GFRAA 35 (L) 05/13/2020 0900  ? ?Lipase  ?   ?Component Value Date/Time  ? LIPASE 59 (H) 06/24/2021 0934  ? ? ? ? ? ?Studies/Results: ?CT ABDOMEN PELVIS WO CONTRAST ? ?Result Date: 06/24/2021 ?CLINICAL DATA:  88 year old female with acute abdominal pain. EXAM: CT ABDOMEN AND PELVIS WITHOUT CONTRAST TECHNIQUE: Multidetector CT imaging of the abdomen and pelvis was performed following the standard protocol without IV contrast. RADIATION DOSE REDUCTION: This exam was performed according to the departmental dose-optimization program which includes automated exposure control, adjustment of the mA and/or kV according to patient size and/or use of iterative reconstruction technique. COMPARISON:  None FINDINGS: Lower chest: Minimal basilar scarring atelectasis. Large hiatal hernia. Signs of coronary artery disease. Heart is incompletely imaged. Hepatobiliary: Noncontrast appearance of liver is unremarkable. Post cholecystectomy with mild extrahepatic biliary duct distension. Pancreas: Signs of pancreatic atrophy without signs of inflammation. Spleen: Normal in size  and contour with signs of prior granulomatous disease. Adrenals/Urinary Tract: Adrenal glands are normal. Urinary bladder is nondistended without signs of adjacent inflammation. Smooth contour the bilateral kidneys without signs of nephrolithiasis or hydronephrosis. 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 Rectal pouch. Moderate to marked distension of the stomach with large hiatal hernia. Signs of bowel obstruction with dilated jejunum in the pelvis, focal outpouching from the jejunum on image 49/3  measuring 2.7 x 1.8 cm near the site of bowel transition. Second less distended loop of bowel just beyond the site of transition extending from the pelvis into the lower abdomen. No substantial stranding around these areas on the current study small areas of gas-filled outpouching from other less distended loops of jejunum compatible with jejunal diverticulosis. Distal small bowel loops herniated through a midline hernia with rectus diastasis. Site of obstruction not in this area nor associated with parastomal herniation of small bowel loops about the RIGHT lower quadrant ileostomy. No signs of pneumatosis. No ascites. Vascular/Lymphatic: Aortic atherosclerosis, no aneurysmal dilation. No adenopathy in the abdomen or in the pelvis. Reproductive: Post hysterectomy without sign of adnexal mass. Other: No ascites or free air. Musculoskeletal: Osteopenia. Signs of prior trauma to the bony pelvis with healed bilateral pubic bone fractures. Healed rib fractures about the LEFT chest. T12 compression fracture with cement augmentation near complete loss of height at this level. Dextroconvex scoliotic curvature and degenerative changes of the lumbar spine. IMPRESSION: 1. Signs of bowel obstruction with both gastric and proximal small bowel distension. Stool like material in the lumen of the dilated small bowel raising the question of some chronicity. Findings likely due to complex adhesions in the pelvis and associated with completely decompressed or nearly completely decompressed bowel beyond the site of obstruction. 2. Focal outpouching from the jejunum with other smaller areas of outpouching compatible with jejunal diverticulosis. No current stranding surrounding these areas or signs of free air. No priors are available for comparison. If the patient's symptoms should worsen would consider evaluation with enteric contrast. 3. 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 4. Large hiatal hernia  with distended stomach extending into the chest. 5. Ventral abdominal wall hernia and parastomal hernia containing small bowel loops. These areas do not represent the site of obstruction. 6. Signs of coronary artery disease. 7. Osteopenia with signs of prior trauma to the bony pelvis with healed bilateral pubic bone fractures. T12 compression fracture with cement augmentation near complete loss of height at this level. 8. Aortic atherosclerosis. Aortic Atherosclerosis (ICD10-I70.0). Electronically Signed   By: Zetta Bills M.D.   On: 06/24/2021 11:29  ? ?DG Abd Portable 1V-Small Bowel Obstruction Protocol-initial, 8 hr delay ? ?Result Date: 06/25/2021 ?CLINICAL DATA:  Small bowel protocol, contrast given 8 hours ago, check nasogastric tube position. EXAM: PORTABLE ABDOMEN - 1 VIEW COMPARISON:  June 25, 2021 (7:36 a.m.) FINDINGS: A nasogastric tube is seen with its distal tip overlying the expected region of the gastric antrum. This is approximately 7.3 cm distal to the expected region of the gastroesophageal junction and stable in location when compared to the prior study. The bowel gas pattern is normal. No radiopaque contrast is seen within the large or small bowel. No radio-opaque calculi or other significant radiographic abnormality are seen. IMPRESSION: Nasogastric tube positioning, as described above. Electronically Signed   By: Virgina Norfolk M.D.   On: 06/25/2021 18:27  ? ?DG Abd Portable 1V ? ?Result Date: 06/25/2021 ?CLINICAL  DATA:  88 year old female status post nasogastric tube placement. EXAM: PORTABLE ABDOMEN - 1 VIEW COMPARISON:  06/24/2021. FINDINGS: Tip of nasogastric tube is in the distal body of the stomach. Side port is near the gastroesophageal junction. Visualized bowel gas pattern is unremarkable. Visualized portions of the thorax demonstrate multiple old healed left-sided rib fractures. Atherosclerotic calcifications are also noted in the thoracic aorta. Surgical clips in the right  axilla, likely from prior lymph node dissection. Post vertebroplasty changes are noted at the level of T12. Numerous vascular calcifications in the upper abdomen. IMPRESSION: 1. Tip of nasogastric tube is in

## 2021-06-26 NOTE — Plan of Care (Signed)
  Problem: Coping: Goal: Level of anxiety will decrease Outcome: Progressing   Problem: Pain Managment: Goal: General experience of comfort will improve Outcome: Progressing   Problem: Safety: Goal: Ability to remain free from injury will improve Outcome: Progressing   

## 2021-06-26 NOTE — Progress Notes (Signed)
?PROGRESS NOTE ? ?Cheryl Salas  KGU:[id number] DOB: 03/05/1925 DOA: 06/24/2021 ?PCP: Gayland Curry, DO  ? ?Brief Narrative: ? ?Patient is a 88 year old female with medical history of CKD stage IIIb, diabetes type 2, hypertension, hyperlipidemia, diverticulitis status post ileostomy, hysterectomy/ bladder suspension, history of bladder/breast cancer with bilateral mastectomy who presented with complaint of abdominal pain, nausea, vomiting from home.  She noticed decreased output from her colostomy.  On presentation she was hypertensive, tachycardic.  CT abdomen showed bowel obstruction with both gastric/proximal small bowel distention.  Patient was admitted for the management of SBO, general surgery following. ? ? ?Assessment & Plan: ? ?Principal Problem: ?  Small bowel obstruction ([place]) ?Active Problems: ?  Type 2 diabetes mellitus with complication, without long-term current use of insulin ([place]) ?  Chronic kidney disease, stage 3, mod decreased GFR ([facility]) ?  Iron deficiency anemia ?  Ileostomy status ([place]) ?  Essential hypertension ?  Hyperlipidemia ?  SOB (shortness of breath) ? ?SBO: Presented with abdominal pain, nausea, vomiting.  History of abdominal surgeries in the past.  Status post ileostomy. ?Started on conservative management, NG tube placed.  Continue IV fluids, pain management, antiemetics.  General surgery following. ?Denies any abdomen pain today.  Good bowel sounds heard.  8-hour abdominal x-ray did not show any contrast.  General surgery planning to clamp the tube and start on sips of water.  Possibly taking the tube out in the p.m. ? ?Type 2 diabetes: Last A1c of 7 in March 2022.  Continue sliding scale insulin for now.  Monitor blood sugars ? ?CKD stage IIIb: Baseline creatinine in the range of 1.4-1.6.  Currently at baseline ? ?Iron deficiency anemia: Baseline hemoglobin about 10-11.  Drop in hemoglobin is most likely secondary to hemodilution from IV fluids ? ?Status post ileostomy: History of  diverticulitis.  Continue ileostomy care ? ?Hyperlipidemia: On Crestor at home ? ?Hypertension: Continue as needed medications for severe hypertension.  Oral medications on hold.  On hydralazine at home ? ?Headache/sore throat: Continue supportive care with Tylenol, throat spray ?  ? ? ?  ?  ? ?DVT prophylaxis:enoxaparin (LOVENOX) injection 30 mg Start: 06/24/21 1615 ?SCDs Start: 06/24/21 1415 ? ? ?  Code Status: Partial Code ? ?Family Communication: Son at the bedside ? ?Patient status:Inpatient ? ?Patient is from :Home ? ?Anticipated discharge SE:GBTD ? ?Estimated DC date: In 1 to 2 days after surgical clearance ? ? ?Consultants: General surgery ? ?Procedures:None ? ?Antimicrobials:  ?Anti-infectives (From admission, onward)  ? ? None  ? ?  ? ? ?Subjective: ?Patient seen and examined at the bedside this morning.  Hemodynamically stable.  She was bothered with headache, sore throat.  No abdominal pain. ? ?Objective: ?Vitals:  ? 06/26/21 0630 06/26/21 0755 06/26/21 0849 06/26/21 0919  ?BP: (!) 153/94 (!) 146/82    ?Pulse: (!) 105 100    ?Resp: '17 18 18 18  '$ ?Temp: 98.5 ?F (36.9 ?C) 98.6 ?F (37 ?C)    ?TempSrc: Oral Oral    ?SpO2: 99% 97%    ? ? ?Intake/Output Summary (Last 24 hours) at 06/26/2021 1022 ?Last data filed at 06/25/2021 2300 ?Gross per 24 hour  ?Intake 100 ml  ?Output 150 ml  ?Net -50 ml  ? ?There were no vitals filed for this visit. ? ?Examination: ? ?General exam: Overall comfortable, not in distress, pleasant elderly female ?HEENT: PERRL, NG tube ?Respiratory system:  no wheezes or crackles  ?Cardiovascular system: S1 & S2 heard, RRR.  ?Gastrointestinal system: Abdomen is  nondistended, soft and nontender. ?Central nervous system: Alert and oriented ?Extremities: No edema, no clubbing ,no cyanosis ?Skin: No rashes, no ulcers,no icterus   ? ? ?Data Reviewed: I have personally reviewed following labs and imaging studies ? ?CBC: ?Recent Labs  ?Lab 06/24/21 ?4098 06/25/21 ?0045  ?WBC 7.5 8.0  ?HGB 11.8* 9.9*   ?HCT 35.1* 30.4*  ?MCV 95.9 95.6  ?PLT 214 187  ? ?Basic Metabolic Panel: ?Recent Labs  ?Lab 06/24/21 ?1191 06/25/21 ?0045 06/26/21 ?4782  ?NA 139 137 140  ?K 4.0 3.7 4.0  ?CL 106 112* 113*  ?CO2 21* 16* 18*  ?GLUCOSE 251* 136* 92  ?BUN 26* 22 20  ?CREATININE 1.68* 1.49* 1.53*  ?CALCIUM 9.7 8.5* 8.6*  ?MG 1.7  --   --   ? ? ? ?No results found for this or any previous visit (from the past 240 hour(s)).  ? ?Radiology Studies: ?CT ABDOMEN PELVIS WO CONTRAST ? ?Result Date: 06/24/2021 ?CLINICAL DATA:  88 year old female with acute abdominal pain. EXAM: CT ABDOMEN AND PELVIS WITHOUT CONTRAST TECHNIQUE: Multidetector CT imaging of the abdomen and pelvis was performed following the standard protocol without IV contrast. RADIATION DOSE REDUCTION: This exam was performed according to the departmental dose-optimization program which includes automated exposure control, adjustment of the mA and/or kV according to patient size and/or use of iterative reconstruction technique. COMPARISON:  None FINDINGS: Lower chest: Minimal basilar scarring atelectasis. Large hiatal hernia. Signs of coronary artery disease. Heart is incompletely imaged. Hepatobiliary: Noncontrast appearance of liver is unremarkable. Post cholecystectomy with mild extrahepatic biliary duct distension. Pancreas: Signs of pancreatic atrophy without signs of inflammation. Spleen: Normal in size and contour with signs of prior granulomatous disease. Adrenals/Urinary Tract: Adrenal glands are normal. Urinary bladder is nondistended without signs of adjacent inflammation. Smooth contour the bilateral kidneys without signs of nephrolithiasis or hydronephrosis. 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 Rectal pouch. Moderate to marked distension of the stomach with large hiatal hernia. Signs of bowel obstruction with dilated jejunum in the pelvis, focal outpouching from the jejunum on image 49/3 measuring 2.7 x 1.8 cm near  the site of bowel transition. Second less distended loop of bowel just beyond the site of transition extending from the pelvis into the lower abdomen. No substantial stranding around these areas on the current study small areas of gas-filled outpouching from other less distended loops of jejunum compatible with jejunal diverticulosis. Distal small bowel loops herniated through a midline hernia with rectus diastasis. Site of obstruction not in this area nor associated with parastomal herniation of small bowel loops about the RIGHT lower quadrant ileostomy. No signs of pneumatosis. No ascites. Vascular/Lymphatic: Aortic atherosclerosis, no aneurysmal dilation. No adenopathy in the abdomen or in the pelvis. Reproductive: Post hysterectomy without sign of adnexal mass. Other: No ascites or free air. Musculoskeletal: Osteopenia. Signs of prior trauma to the bony pelvis with healed bilateral pubic bone fractures. Healed rib fractures about the LEFT chest. T12 compression fracture with cement augmentation near complete loss of height at this level. Dextroconvex scoliotic curvature and degenerative changes of the lumbar spine. IMPRESSION: 1. Signs of bowel obstruction with both gastric and proximal small bowel distension. Stool like material in the lumen of the dilated small bowel raising the question of some chronicity. Findings likely due to complex adhesions in the pelvis and associated with completely decompressed or nearly completely decompressed bowel beyond the site of obstruction. 2. Focal outpouching from the jejunum with other smaller areas of  outpouching compatible with jejunal diverticulosis. No current stranding surrounding these areas or signs of free air. No priors are available for comparison. If the patient's symptoms should worsen would consider evaluation with enteric contrast. 3. 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 4. Large hiatal hernia with distended stomach  extending into the chest. 5. Ventral abdominal wall hernia and parastomal hernia containing small bowel loops. These areas do not represent the site of obstruction. 6. Signs of coronary artery disease. 7.

## 2021-06-26 NOTE — Plan of Care (Signed)
  Problem: Nutrition: Goal: Adequate nutrition will be maintained Outcome: Progressing   Problem: Pain Managment: Goal: General experience of comfort will improve Outcome: Progressing   

## 2021-06-27 DIAGNOSIS — K56609 Unspecified intestinal obstruction, unspecified as to partial versus complete obstruction: Secondary | ICD-10-CM | POA: Diagnosis not present

## 2021-06-27 LAB — BASIC METABOLIC PANEL
Anion gap: 7 (ref 5–15)
BUN: 20 mg/dL (ref 8–23)
CO2: 21 mmol/L — ABNORMAL LOW (ref 22–32)
Calcium: 8.7 mg/dL — ABNORMAL LOW (ref 8.9–10.3)
Chloride: 109 mmol/L (ref 98–111)
Creatinine, Ser: 1.41 mg/dL — ABNORMAL HIGH (ref 0.44–1.00)
GFR, Estimated: 35 mL/min — ABNORMAL LOW (ref >60)
Glucose, Bld: 91 mg/dL (ref 70–99)
Potassium: 3.3 mmol/L — ABNORMAL LOW (ref 3.5–5.1)
Sodium: 137 mmol/L (ref 135–145)

## 2021-06-27 LAB — CBC
HCT: 30.4 % — ABNORMAL LOW (ref 36.0–46.0)
Hemoglobin: 10 g/dL — ABNORMAL LOW (ref 12.0–15.0)
MCH: 31.3 pg (ref 26.0–34.0)
MCHC: 32.9 g/dL (ref 30.0–36.0)
MCV: 95.3 fL (ref 80.0–100.0)
Platelets: 187 10*3/uL (ref 150–400)
RBC: 3.19 MIL/uL — ABNORMAL LOW (ref 3.87–5.11)
RDW: 13.5 % (ref 11.5–15.5)
WBC: 7.4 10*3/uL (ref 4.0–10.5)
nRBC: 0 % (ref 0.0–0.2)

## 2021-06-27 LAB — GLUCOSE, CAPILLARY
Glucose-Capillary: 117 mg/dL — ABNORMAL HIGH (ref 70–99)
Glucose-Capillary: 143 mg/dL — ABNORMAL HIGH (ref 70–99)
Glucose-Capillary: 147 mg/dL — ABNORMAL HIGH (ref 70–99)
Glucose-Capillary: 221 mg/dL — ABNORMAL HIGH (ref 70–99)
Glucose-Capillary: 232 mg/dL — ABNORMAL HIGH (ref 70–99)
Glucose-Capillary: 279 mg/dL — ABNORMAL HIGH (ref 70–99)
Glucose-Capillary: 94 mg/dL (ref 70–99)

## 2021-06-27 MED ORDER — ACETAMINOPHEN 500 MG PO TABS: 1000.0000 mg | ORAL_TABLET | Freq: Four times a day (QID) | ORAL | Status: DC | PRN

## 2021-06-27 MED ORDER — TRAMADOL HCL 50 MG PO TABS: 50.0000 mg | ORAL_TABLET | Freq: Four times a day (QID) | ORAL | Status: DC | PRN

## 2021-06-27 MED ORDER — POTASSIUM CHLORIDE CRYS ER 20 MEQ PO TBCR
40.0000 meq | EXTENDED_RELEASE_TABLET | Freq: Once | ORAL | Status: AC
  Administered 2021-06-27: 40 meq via ORAL
  Filled 2021-06-27: qty 2

## 2021-06-27 NOTE — Progress Notes (Addendum)
?PROGRESS NOTE ? ?Cheryl Salas  QVZ:[id number] DOB: 03/05/1925 DOA: 06/24/2021 ?PCP: Gayland Curry, DO  ? ?Brief Narrative: ? ?Patient is a 88 year old female with medical history of CKD stage IIIb, diabetes type 2, hypertension, hyperlipidemia, diverticulitis status post ileostomy, hysterectomy/ bladder suspension, history of bladder/breast cancer with bilateral mastectomy who presented with complaint of abdominal pain, nausea, vomiting from home.  She noticed decreased output from her colostomy.  On presentation she was hypertensive, tachycardic.  CT abdomen showed bowel obstruction with both gastric/proximal small bowel distention.  Patient was admitted for the management of SBO, general surgery following.  Overall condition has significantly improved.  Her nausea, vomiting, abdominal pain have  significantly improved, plan for advancing the diet today with possible discharge tomorrow. ? ? ?Assessment & Plan: ? ?Principal Problem: ?  Small bowel obstruction ([place]) ?Active Problems: ?  Type 2 diabetes mellitus with complication, without long-term current use of insulin ([place]) ?  Chronic kidney disease, stage 3, mod decreased GFR ([facility]) ?  Iron deficiency anemia ?  Ileostomy status ([place]) ?  Essential hypertension ?  Hyperlipidemia ?  SOB (shortness of breath) ? ?SBO: Presented with abdominal pain, nausea, vomiting.  History of abdominal surgeries in the past.  Status post ileostomy. ?Started on conservative management, NG tube was placed.  Started on  IV fluids, pain management, antiemetics.  General surgery following. ?Denies any abdomen pain today.  NG tube has been removed.  She has stool in the ileostomy bag.  Denies any nausea, vomiting or abdominal pain.  Advance to full ? ?Type 2 diabetes: Last A1c of 7 in March 2022.  Continue sliding scale insulin for now.  Monitor blood sugars ? ?CKD stage IIIb: Baseline creatinine in the range of 1.4-1.6.  Currently at baseline ? ?Iron deficiency anemia: Baseline  hemoglobin about 10-11.  Drop in hemoglobin is most likely secondary to hemodilution from IV fluids ? ?Status post ileostomy: History of diverticulitis.  Continue ileostomy care ? ?Hyperlipidemia: On Crestor at home ? ?Hypertension: Continue hydralazine ? ?Hypokalemia: Supplemented with potassium ? ?Headache/sore throat: Continue supportive care with Tylenol, throat spray.  Resolved ?  ? ? ?  ?  ? ?DVT prophylaxis:enoxaparin (LOVENOX) injection 30 mg Start: 06/24/21 1615 ?SCDs Start: 06/24/21 1415 ? ? ?  Code Status: Partial Code ? ?Family Communication: Son at the bedside ? ?Patient status:Inpatient ? ?Patient is from :Home ? ?Anticipated discharge PI:RJJO ? ?Estimated DC date: tomorrow ? ? ?Consultants: General surgery ? ?Procedures:None ? ?Antimicrobials:  ?Anti-infectives (From admission, onward)  ? ? None  ? ?  ? ? ?Subjective: ?Patient seen and examined the bedside this morning.  Hemodynamically stable.  Very comfortable today.  Tolerating clear liquid diet.  Denies abdomen pain, nausea or vomiting. ? ?Objective: ?Vitals:  ? 06/26/21 1646 06/26/21 2025 06/27/21 0504 06/27/21 8416  ?BP: (!) 147/85 (!) 147/98 126/71 (!) 128/99  ?Pulse: 90 (!) 110 92 92  ?Resp: '18 18 18 16  '$ ?Temp: 97.8 ?F (36.6 ?C) 99.3 ?F (37.4 ?C) 98.2 ?F (36.8 ?C) 98.1 ?F (36.7 ?C)  ?TempSrc:  Oral Oral Oral  ?SpO2: 99% 98% 98% 98%  ? ? ?Intake/Output Summary (Last 24 hours) at 06/27/2021 1018 ?Last data filed at 06/26/2021 1046 ?Gross per 24 hour  ?Intake 662.66 ml  ?Output --  ?Net 662.66 ml  ? ?There were no vitals filed for this visit. ? ?Examination: ? ?General exam: Overall comfortable, not in distress ?HEENT: PERRL ?Respiratory system:  no wheezes or crackles  ?Cardiovascular system: S1 & S2  heard, RRR.  ?Gastrointestinal system: Abdomen is nondistended, soft and nontender.Ilesotomy ?Central nervous system: Alert and oriented ?Extremities: No edema, no clubbing ,no cyanosis ?Skin: No rashes, no ulcers,no icterus   ? ? ?Data Reviewed: I  have personally reviewed following labs and imaging studies ? ?CBC: ?Recent Labs  ?Lab 06/24/21 ?7412 06/25/21 ?0045 06/27/21 ?0021  ?WBC 7.5 8.0 7.4  ?HGB 11.8* 9.9* 10.0*  ?HCT 35.1* 30.4* 30.4*  ?MCV 95.9 95.6 95.3  ?PLT 214 187 187  ? ?Basic Metabolic Panel: ?Recent Labs  ?Lab 06/24/21 ?8786 06/25/21 ?0045 06/26/21 ?7672 06/27/21 ?0021  ?NA 139 137 140 137  ?K 4.0 3.7 4.0 3.3*  ?CL 106 112* 113* 109  ?CO2 21* 16* 18* 21*  ?GLUCOSE 251* 136* 92 91  ?BUN 26* '22 20 20  '$ ?CREATININE 1.68* 1.49* 1.53* 1.41*  ?CALCIUM 9.7 8.5* 8.6* 8.7*  ?MG 1.7  --   --   --   ? ? ? ?No results found for this or any previous visit (from the past 240 hour(s)).  ? ?Radiology Studies: ?DG Abd Portable 1V-Small Bowel Obstruction Protocol-24 hr delay ? ?Result Date: 06/26/2021 ?CLINICAL DATA:  Abdominal EXAM: PORTABLE ABDOMEN - 1 VIEW COMPARISON:  Abdominal radiograph dated June 25, 2021 FINDINGS: NG tube with tip is over the expected area of the stomach with side port near the GE junction. The bowel gas pattern is normal. No radio-opaque calculi or other significant radiographic abnormality are seen. IMPRESSION: NG tube with tip is over the expected area of the stomach with side port near the GE junction. Recommend advancement for optimal positioning. Electronically Signed   By: Yetta Glassman M.D.   On: 06/26/2021 10:24  ? ?DG Abd Portable 1V-Small Bowel Obstruction Protocol-initial, 8 hr delay ? ?Result Date: 06/25/2021 ?CLINICAL DATA:  Small bowel protocol, contrast given 8 hours ago, check nasogastric tube position. EXAM: PORTABLE ABDOMEN - 1 VIEW COMPARISON:  June 25, 2021 (7:36 a.m.) FINDINGS: A nasogastric tube is seen with its distal tip overlying the expected region of the gastric antrum. This is approximately 7.3 cm distal to the expected region of the gastroesophageal junction and stable in location when compared to the prior study. The bowel gas pattern is normal. No radiopaque contrast is seen within the large or small  bowel. No radio-opaque calculi or other significant radiographic abnormality are seen. IMPRESSION: Nasogastric tube positioning, as described above. Electronically Signed   By: Virgina Norfolk M.D.   On: 06/25/2021 18:27   ? ?Scheduled Meds: ? enoxaparin (LOVENOX) injection  30 mg Subcutaneous Q24H  ? feeding supplement  1 Container Oral TID BM  ? hydrALAZINE  25 mg Oral Q8H  ? insulin aspart  0-9 Units Subcutaneous Q4H  ? ?Continuous Infusions: ? lactated ringers 75 mL/hr at 06/26/21 2136  ? ? ? LOS: 2 days  ? ?Shelly Coss, MD ?[facility] ?P4/21/2023, 10:18 AM   ?

## 2021-06-27 NOTE — Progress Notes (Signed)
? ?Progress Note ? ?   ?Subjective: ?Feeling much better now that NGT is out. Tolerating CLD without nausea, emesis, abdominal pain. Having good ileostomy output. Getting OOB ? ?Son is bedside ? ?Objective: ?Vital signs in last 24 hours: ?Temp:  [97.8 ?F (36.6 ?C)-99.3 ?F (37.4 ?C)] 98.1 ?F (36.7 ?C) (04/21 1275) ?Pulse Rate:  [90-110] 92 (04/21 0722) ?Resp:  [16-18] 16 (04/21 1700) ?BP: (126-147)/(71-99) 128/99 (04/21 1749) ?SpO2:  [98 %-99 %] 98 % (04/21 0722) ?Last BM Date : 06/26/21 ? ?Intake/Output from previous day: ?04/20 0701 - 04/21 0700 ?In: 662.7 [I.V.:662.7] ?Out: -  ?Intake/Output this shift: ?No intake/output data recorded. ? ?PE: ?General: pleasant, WD, female who is laying in bed in NAD ?Lungs: Respiratory effort nonlabored on room air ?Abd: soft, ND, +BS, soft reducible ventral and parastomal hernias. Ileostomy with air and small amount of liquid stool in bag. ?MSK: all 4 extremities are symmetrical with no cyanosis, clubbing, or edema. ?Skin: warm and dry ?Psych: A&Ox3 with an appropriate affect.  ? ? ?Lab Results:  ?Recent Labs  ?  06/25/21 ?0045 06/27/21 ?0021  ?WBC 8.0 7.4  ?HGB 9.9* 10.0*  ?HCT 30.4* 30.4*  ?PLT 187 187  ? ? ?BMET ?Recent Labs  ?  06/26/21 ?4496 06/27/21 ?0021  ?NA 140 137  ?K 4.0 3.3*  ?CL 113* 109  ?CO2 18* 21*  ?GLUCOSE 92 91  ?BUN 20 20  ?CREATININE 1.53* 1.41*  ?CALCIUM 8.6* 8.7*  ? ? ?PT/INR ?No results for input(s): LABPROT, INR in the last 72 hours. ?CMP  ?   ?Component Value Date/Time  ? NA 137 06/27/2021 0021  ? NA 138 09/23/2017 0000  ? K 3.3 (L) 06/27/2021 0021  ? CL 109 06/27/2021 0021  ? CO2 21 (L) 06/27/2021 0021  ? GLUCOSE 91 06/27/2021 0021  ? BUN 20 06/27/2021 0021  ? BUN 49 (A) 09/23/2017 0000  ? CREATININE 1.41 (H) 06/27/2021 0021  ? CREATININE 1.49 (H) 05/13/2020 0900  ? CALCIUM 8.7 (L) 06/27/2021 0021  ? PROT 7.2 06/24/2021 0934  ? ALBUMIN 4.3 06/24/2021 0934  ? AST 22 06/24/2021 0934  ? ALT 22 06/24/2021 0934  ? ALKPHOS 37 (L) 06/24/2021 0934  ? BILITOT  0.7 06/24/2021 0934  ? GFRNONAA 35 (L) 06/27/2021 0021  ? GFRNONAA 30 (L) 05/13/2020 0900  ? GFRAA 35 (L) 05/13/2020 0900  ? ?Lipase  ?   ?Component Value Date/Time  ? LIPASE 59 (H) 06/24/2021 0934  ? ? ? ? ? ?Studies/Results: ?DG Abd Portable 1V-Small Bowel Obstruction Protocol-24 hr delay ? ?Result Date: 06/26/2021 ?CLINICAL DATA:  Abdominal EXAM: PORTABLE ABDOMEN - 1 VIEW COMPARISON:  Abdominal radiograph dated June 25, 2021 FINDINGS: NG tube with tip is over the expected area of the stomach with side port near the GE junction. The bowel gas pattern is normal. No radio-opaque calculi or other significant radiographic abnormality are seen. IMPRESSION: NG tube with tip is over the expected area of the stomach with side port near the GE junction. Recommend advancement for optimal positioning. Electronically Signed   By: Yetta Glassman M.D.   On: 06/26/2021 10:24  ? ?DG Abd Portable 1V-Small Bowel Obstruction Protocol-initial, 8 hr delay ? ?Result Date: 06/25/2021 ?CLINICAL DATA:  Small bowel protocol, contrast given 8 hours ago, check nasogastric tube position. EXAM: PORTABLE ABDOMEN - 1 VIEW COMPARISON:  June 25, 2021 (7:36 a.m.) FINDINGS: A nasogastric tube is seen with its distal tip overlying the expected region of the gastric antrum. This is approximately 7.3  cm distal to the expected region of the gastroesophageal junction and stable in location when compared to the prior study. The bowel gas pattern is normal. No radiopaque contrast is seen within the large or small bowel. No radio-opaque calculi or other significant radiographic abnormality are seen. IMPRESSION: Nasogastric tube positioning, as described above. Electronically Signed   By: Virgina Norfolk M.D.   On: 06/25/2021 18:27   ? ?Anti-infectives: ?Anti-infectives (From admission, onward)  ? ? None  ? ?  ? ? ? ?Assessment/Plan ?Hx of colectomy and ileostomy in 2017 ?Chronic ventral and parastomal hernias without incarceration  ?SBO ?- CT 4/18:  gastric and proximal small bowel distension with fecalized material in small bowel, decompressed bowel beyond transition in pelvis ?- no leukocytosis, afebrile, tachycardia and HTN improved ?- 8 hour xray with normal bowel gas pattern. No contrast visualized ?- no indication for acute surgical intervention currently ?- PO tylenol for pain, hurricane spray for throat soreness ? ?She is improving with conservative management and tolerating diet. Advance to full liquids and then soft as tolerated. If tolerating diet this afternoon can discharge from surgical perspective ?  ?FEN: IVF. FLD ADAT soft ?VTE: SCDs, lovenox ?ID: no abx currently indicated from a surgical standpoint ?  ?- below per [facility] -  ?Jejunal diverticulosis ?Hiatal hernia ?Hx of bladder cancer s/p transurethral resection of bladder tumor ??Hx of breast cancer s/p bilateral mastectomies ?CKD stage III ?T2DM ?HTN ?HLD ?B12 deficiency ?Iron deficiency anemia ?Peripheral neuropathy ?Vitamin D deficiency  ?Chronic back pain  ?Gout ? ?I reviewed hospitalist notes, last 24 h vitals and pain scores, last 48 h intake and output, last 24 h labs and trends (CBC, BMP) ? ? LOS: 2 days  ? ?Winferd Humphrey, PA-C ?[facility] ?06/27/2021, 9:05 AM ?Please see Amion for pager number during day hours 7:00am-4:30pm ? ?

## 2021-06-28 DIAGNOSIS — K56609 Unspecified intestinal obstruction, unspecified as to partial versus complete obstruction: Secondary | ICD-10-CM | POA: Diagnosis not present

## 2021-06-28 LAB — BASIC METABOLIC PANEL
Anion gap: 5 (ref 5–15)
BUN: 21 mg/dL (ref 8–23)
CO2: 19 mmol/L — ABNORMAL LOW (ref 22–32)
Calcium: 8.8 mg/dL — ABNORMAL LOW (ref 8.9–10.3)
Chloride: 113 mmol/L — ABNORMAL HIGH (ref 98–111)
Creatinine, Ser: 1.22 mg/dL — ABNORMAL HIGH (ref 0.44–1.00)
GFR, Estimated: 41 mL/min — ABNORMAL LOW (ref >60)
Glucose, Bld: 224 mg/dL — ABNORMAL HIGH (ref 70–99)
Potassium: 4 mmol/L (ref 3.5–5.1)
Sodium: 137 mmol/L (ref 135–145)

## 2021-06-28 LAB — GLUCOSE, CAPILLARY
Glucose-Capillary: 161 mg/dL — ABNORMAL HIGH (ref 70–99)
Glucose-Capillary: 159 mg/dL — ABNORMAL HIGH (ref 70–99)
Glucose-Capillary: 195 mg/dL — ABNORMAL HIGH (ref 70–99)

## 2021-06-28 NOTE — Discharge Summary (Signed)
Physician Discharge Summary  ?Cheryl Salas BHA:[id number] DOB: 03/05/1925 DOA: 06/24/2021 ? ?PCP: Gayland Curry, DO ? ?Admit date: 06/24/2021 ?Discharge date: 06/28/2021 ? ?Admitted From: Home ?Disposition:  Home ? ?Discharge Condition:Stable ?CODE STATUS:FULL ?Diet recommendation: Regular ?Brief/Interim Summary: ? ?Patient is a 88 year old female with medical history of CKD stage IIIb, diabetes type 2, hypertension, hyperlipidemia, diverticulitis status post ileostomy, hysterectomy/ bladder suspension, history of bladder/breast cancer with bilateral mastectomy who presented with complaint of abdominal pain, nausea, vomiting from home.  She noticed decreased output from her colostomy.  On presentation she was hypertensive, tachycardic.  CT abdomen showed bowel obstruction with both gastric/proximal small bowel distention.  Patient was admitted for the management of SBO, general surgery following.  Overall condition has significantly improved.  Her nausea, vomiting, abdominal pain have  significantly improved. ?She has tolerated advance diet, medically stable for discharge home today.   ? ?Following problems were addressed during her hospitalization: ? ?SBO: Presented with abdominal pain, nausea, vomiting.  History of abdominal surgeries in the past.  Status post ileostomy. ?Started on conservative management, NG tube was placed.  Started on  IV fluids, pain management, antiemetics.  General surgery following. ?NG tube has been removed.  She has stool in the ileostomy bag.  Denies any nausea, vomiting or abdominal pain.  Denies abdomen pain.  Diet has been advanced and she is tolerating.  General surgery cleared for discharge ?  ?Type 2 diabetes: Last A1c of 7 in March 2022.  Continue home regimen ? ?CKD stage IIIb: Baseline creatinine in the range of 1.4-1.6.  Currently at baseline ?  ?Iron deficiency anemia: Baseline hemoglobin about 10-11.  Drop in hemoglobin is most likely secondary to hemodilution from IV fluids.   Stable ?  ?Status post ileostomy: History of diverticulitis.  Continue ileostomy care ?  ?Hyperlipidemia: On Crestor at home ?  ?Hypertension: Continue hydralazine ?  ?Hypokalemia: Supplemented with potassium ?  ?Headache/sore throat: Treated with Tylenol, throat spray.  Resolved ?  ? ?Discharge Diagnoses:  ?Principal Problem: ?  Small bowel obstruction ([place]) ?Active Problems: ?  Type 2 diabetes mellitus with complication, without long-term current use of insulin ([place]) ?  Chronic kidney disease, stage 3, mod decreased GFR ([facility]) ?  Iron deficiency anemia ?  Ileostomy status ([place]) ?  Essential hypertension ?  Hyperlipidemia ?  SOB (shortness of breath) ? ? ? ?Discharge Instructions ? ?Discharge Instructions   ? ? Diet general   Complete by: As directed ?  ? Discharge instructions   Complete by: As directed ?  ? 1)Please follow up with your PCP in a week  ? Increase activity slowly   Complete by: As directed ?  ? ?  ? ?Allergies as of 06/28/2021   ? ?   Reactions  ? Codeine Itching, Nausea Only, Swelling  ? Flexeril [cyclobenzaprine]   ? Per Records from [facility]   ? ?  ? ?  ?Medication List  ?  ? ?STOP taking these medications   ? ?pioglitazone 15 MG tablet ?Commonly known as: ACTOS ?  ?simethicone 125 MG chewable tablet ?Commonly known as: MYLICON ?  ? ?  ? ?TAKE these medications   ? ?acetaminophen 500 MG tablet ?Commonly known as: TYLENOL ?Take 500 mg by mouth daily as needed (pain). ?What changed: Another medication with the same name was removed. Continue taking this medication, and follow the directions you see here. ?  ?alendronate 70 MG tablet ?Commonly known as: FOSAMAX ?TAKE 1 TABLET BY MOUTH ONCE A WEEK  WITH A FULL GLASS OF WATER ON AN EMPTY STOMACH ?  ?Aspirin Low Dose 81 MG EC tablet ?Generic drug: aspirin ?Take 81 mg by mouth daily. ?  ?carbamide peroxide 6.5 % OTIC solution ?Commonly known as: DEBROX ?Place 5 drops into both ears at bedtime as needed (x 1 week). ?What changed:  ?how much to  take ?when to take this ?additional instructions ?  ?D3 High Potency 50 MCG (2000 UT) Caps ?Generic drug: Cholecalciferol ?Take 1 capsule (2,000 Units total) by mouth daily. Needs an appointment before anymore future refills. ?  ?diphenoxylate-atropine 2.5-0.025 MG tablet ?Commonly known as: LOMOTIL ?TAKE 1 TABLET BY MOUTH EVERY MORNING, 1 TABLET AT LUNCH, AND 1 TABLET AT dinner ?What changed: See the new instructions. ?  ?hydrALAZINE 25 MG tablet ?Commonly known as: APRESOLINE ?Take 25 mg by mouth 3 (three) times daily. ?  ?iron polysaccharides 150 MG capsule ?Commonly known as: Ferrex 150 ?Take one capsule by mouth once daily. Needs an appointment before anymore future refills. ?What changed:  ?how much to take ?how to take this ?when to take this ?  ?Jardiance 10 MG Tabs tablet ?Generic drug: empagliflozin ?Take 10 mg by mouth daily. ?  ?loperamide 2 MG capsule ?Commonly known as: IMODIUM ?TAKE 1 CAPSULE BY MOUTH EVERY MORNING, 1 CAPSULE AT LUNCH, AND 1 CAPSULE AT dinner ?What changed:  ?how much to take ?how to take this ?when to take this ?additional instructions ?  ?ondansetron 4 MG disintegrating tablet ?Commonly known as: ZOFRAN-ODT ?Take 1 tablet (4 mg total) by mouth every 8 (eight) hours as needed for nausea or vomiting. ?What changed: when to take this ?  ?OVER THE COUNTER MEDICATION ?Take 2 capsules by mouth daily as needed (Flatulence). ?  ?psyllium 58.6 % packet ?Commonly known as: METAMUCIL ?Take 1 packet by mouth at bedtime. ?  ?rosuvastatin 10 MG tablet ?Commonly known as: CRESTOR ?Take 10 mg by mouth daily. ?  ?sodium bicarbonate 650 MG tablet ?Take 1 tablet (650 mg total) by mouth 2 (two) times daily. Needs an appointment before anymore future refills. ?  ?traMADol 50 MG tablet ?Commonly known as: ULTRAM ?Take one tablet by mouth every twelve hours as needed for severe pain. Needs an appointment before anymore future refills. ?What changed:  ?how much to take ?how to take this ?when to take  this ?reasons to take this ?additional instructions ?  ?vitamin C 500 MG tablet ?Commonly known as: ASCORBIC ACID ?Take 1 tablet (500 mg total) by mouth daily. Needs an appointment before anymore future refills. ?  ?zolpidem 5 MG tablet ?Commonly known as: AMBIEN ?TAKE 1 TABLET BY MOUTH AT BEDTIME AS NEEDED FOR SLEEP ?What changed:  ?reasons to take this ?additional instructions ?  ? ?  ? ? Follow-up Information   ? ? Reed, Tiffany L, DO. Schedule an appointment as soon as possible for a visit in 1 week(s).   ?Specialty: Geriatric Medicine ?Contact information: ?[address] ?[place] [address] ?[phone] ? ? ?  ?  ? ?  ?  ? ?  ? ?Allergies  ?Allergen Reactions  ? Codeine Itching, Nausea Only and Swelling  ? Flexeril [Cyclobenzaprine]   ?  Per Records from [facility]   ? ? ?Consultations: ?General surgery ? ? ?Procedures/Studies: ?CT ABDOMEN PELVIS WO CONTRAST ? ?Result Date: 06/24/2021 ?CLINICAL DATA:  88 year old female with acute abdominal pain. EXAM: CT ABDOMEN AND PELVIS WITHOUT CONTRAST TECHNIQUE: Multidetector CT imaging of the abdomen and pelvis was performed following the standard protocol without  IV contrast. RADIATION DOSE REDUCTION: This exam was performed according to the departmental dose-optimization program which includes automated exposure control, adjustment of the mA and/or kV according to patient size and/or use of iterative reconstruction technique. COMPARISON:  None FINDINGS: Lower chest: Minimal basilar scarring atelectasis. Large hiatal hernia. Signs of coronary artery disease. Heart is incompletely imaged. Hepatobiliary: Noncontrast appearance of liver is unremarkable. Post cholecystectomy with mild extrahepatic biliary duct distension. Pancreas: Signs of pancreatic atrophy without signs of inflammation. Spleen: Normal in size and contour with signs of prior granulomatous disease. Adrenals/Urinary Tract: Adrenal glands are normal. Urinary bladder is nondistended without signs of  adjacent inflammation. Smooth contour the bilateral kidneys without signs of nephrolithiasis or hydronephrosis. 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 Rectal pouch. Mo

## 2021-06-28 NOTE — Progress Notes (Signed)
? ?  Subjective/Chief Complaint: ?Pt doing well this AM tol fulls ?Having ostomy fxn ?mobilizing ? ? ?Objective: ?Vital signs in last 24 hours: ?Temp:  [97.6 ?F (36.4 ?C)-99.2 ?F (37.3 ?C)] 99.1 ?F (37.3 ?C) (04/22 0725) ?Pulse Rate:  [80-109] 92 (04/22 0725) ?Resp:  [16-18] 16 (04/22 0725) ?BP: (117-160)/(78-98) 117/79 (04/22 0725) ?SpO2:  [99 %-100 %] 100 % (04/22 0725) ?Last BM Date : 06/26/21 ? ?Intake/Output from previous day: ?No intake/output data recorded. ?Intake/Output this shift: ?No intake/output data recorded. ?PE: ? ?Constitutional: No acute distress, conversant, appears states age. ?Eyes: Anicteric sclerae, moist conjunctiva, no lid lag ?Lungs: Clear to auscultation bilaterally, normal respiratory effort ?CV: regular rate and rhythm, no murmurs, no peripheral edema, pedal pulses 2+ ?GI: Soft, no masses or hepatosplenomegaly, non-tender to palpation, ostomy patent ?Skin: No rashes, palpation reveals normal turgor ?Psychiatric: appropriate judgment and insight, oriented to person, place, and time ? ? ?Lab Results:  ?Recent Labs  ?  06/27/21 ?0021  ?WBC 7.4  ?HGB 10.0*  ?HCT 30.4*  ?PLT 187  ? ?BMET ?Recent Labs  ?  06/27/21 ?0021 06/28/21 ?1696  ?NA 137 137  ?K 3.3* 4.0  ?CL 109 113*  ?CO2 21* 19*  ?GLUCOSE 91 224*  ?BUN 20 21  ?CREATININE 1.41* 1.22*  ?CALCIUM 8.7* 8.8*  ? ?PT/INR ?No results for input(s): LABPROT, INR in the last 72 hours. ?ABG ?No results for input(s): PHART, HCO3 in the last 72 hours. ? ?Invalid input(s): PCO2, PO2 ? ?Studies/Results: ?DG Abd Portable 1V-Small Bowel Obstruction Protocol-24 hr delay ? ?Result Date: 06/26/2021 ?CLINICAL DATA:  Abdominal EXAM: PORTABLE ABDOMEN - 1 VIEW COMPARISON:  Abdominal radiograph dated June 25, 2021 FINDINGS: NG tube with tip is over the expected area of the stomach with side port near the GE junction. The bowel gas pattern is normal. No radio-opaque calculi or other significant radiographic abnormality are seen. IMPRESSION: NG tube with tip is  over the expected area of the stomach with side port near the GE junction. Recommend advancement for optimal positioning. Electronically Signed   By: Yetta Glassman M.D.   On: 06/26/2021 10:24   ? ?Anti-infectives: ?Anti-infectives (From admission, onward)  ? ? None  ? ?  ? ? ?Assessment/Plan: ?Hx of colectomy and ileostomy in 2017 ?Chronic ventral and parastomal hernias without incarceration  ?SBO ?- CT 4/18: gastric and proximal small bowel distension with fecalized material in small bowel, decompressed bowel beyond transition in pelvis ?- no leukocytosis, afebrile, tachycardia and HTN improved ?- 8 hour xray with normal bowel gas pattern. No contrast visualized ?- no indication for acute surgical intervention currently ?- PO tylenol for pain, hurricane spray for throat soreness ?  ?She is improving with conservative management and tolerating diet. Advance to soft.  If she can tol for lunch OK to DC from out standpoint. ?Please call back if needed. ?  ?FEN: IVF. FLD ADAT soft ?VTE: SCDs, lovenox ?ID: no abx currently indicated from a surgical standpoint ?  ?- below per [facility] -  ?Jejunal diverticulosis ?Hiatal hernia ?Hx of bladder cancer s/p transurethral resection of bladder tumor ??Hx of breast cancer s/p bilateral mastectomies ?CKD stage III ?T2DM ?HTN ?HLD ?B12 deficiency ?Iron deficiency anemia ?Peripheral neuropathy ?Vitamin D deficiency  ?Chronic back pain  ?Gout ? LOS: 3 days  ? ? ?Ralene Ok ?06/28/2021 ? ?

## 2021-07-09 ENCOUNTER — Other Ambulatory Visit: Payer: Medicare Other | Admitting: Internal Medicine

## 2021-07-09 ENCOUNTER — Encounter: Payer: Self-pay | Admitting: Internal Medicine

## 2021-07-09 ENCOUNTER — Other Ambulatory Visit: Payer: Self-pay | Admitting: Internal Medicine

## 2021-07-09 VITALS — BP 122/62 | HR 102 | Temp 98.2°F | Resp 20

## 2021-07-09 DIAGNOSIS — Z794 Long term (current) use of insulin: Secondary | ICD-10-CM

## 2021-07-09 DIAGNOSIS — K439 Ventral hernia without obstruction or gangrene: Secondary | ICD-10-CM

## 2021-07-09 DIAGNOSIS — K435 Parastomal hernia without obstruction or  gangrene: Secondary | ICD-10-CM

## 2021-07-09 DIAGNOSIS — I10 Essential (primary) hypertension: Secondary | ICD-10-CM

## 2021-07-09 DIAGNOSIS — G8929 Other chronic pain: Secondary | ICD-10-CM

## 2021-07-09 DIAGNOSIS — N1831 Chronic kidney disease, stage 3a: Secondary | ICD-10-CM

## 2021-07-09 DIAGNOSIS — K449 Diaphragmatic hernia without obstruction or gangrene: Secondary | ICD-10-CM

## 2021-07-09 DIAGNOSIS — R000 Tachycardia, unspecified: Secondary | ICD-10-CM

## 2021-07-09 DIAGNOSIS — E1122 Type 2 diabetes mellitus with diabetic chronic kidney disease: Secondary | ICD-10-CM

## 2021-07-09 DIAGNOSIS — M545 Low back pain, unspecified: Secondary | ICD-10-CM

## 2021-07-09 DIAGNOSIS — Z932 Ileostomy status: Secondary | ICD-10-CM

## 2021-07-09 MED ORDER — TRAMADOL HCL 50 MG PO TABS
50.0000 mg | ORAL_TABLET | Freq: Every day | ORAL | 0 refills | Status: DC | PRN
Start: 2021-07-09 — End: 2021-11-10

## 2021-07-09 MED ORDER — EMPAGLIFLOZIN 25 MG PO TABS: 25.0000 mg | ORAL_TABLET | Freq: Every day | ORAL | 3 refills | Status: AC

## 2021-07-09 NOTE — Progress Notes (Signed)
[facility] Follow-Up Visit Telephone: [phone]  Fax: [phone]   Date of encounter: 07/09/21 11:57 AM PATIENT NAME: Cheryl Salas [address] [place] [address]   [phone] (home)  DOB: 03/05/1925 MRN: [id number] PRIMARY CARE PROVIDER:    Gayland Curry, DO,  [place] [address] [phone]  REFERRING PROVIDER:   Gayland Curry, DO [place],  [place] [address] [phone]  RESPONSIBLE PARTY:    Contact Information     Name Relation Home Work [place], [place] Son   [phone]   Hausen,Lindy Daughter [phone]  [phone]        I met face to face with patient and family in her  home.  This is follow-up visit.                                     ASSESSMENT AND PLAN / RECOMMENDATIONS:   Advance Care Planning/Goals of Care: Goals include to maximize quality of life and symptom management. Patient/health care surrogate gave his/her permission to discuss.Our advance care planning conversation included a discussion about:    The value and importance of advance care planning  Experiences with loved ones who have been seriously ill or have died  Exploration of personal, cultural or spiritual beliefs that might influence medical decisions  Exploration of goals of care in the event of a sudden injury or illness  Identification  of a healthcare agent--son, Dr. Herbie Saxon Review and updating or creation of an  advance directive document . Decision not to resuscitate or to de-escalate disease focused treatments due to poor prognosis. CODE STATUS:  DNR, MOST on file     Chief Complaint  Patient presents with   Transitions Of Care    Within 14 days of hospital discharge     HPI: Patient is a 88 y.o. female seen today for hospital follow-up s/p admission from 4/18-4/22/23 for small bowel obstruction.  Seems this was partial as she had continued output the entire time per Jude and pt  herself.    Monday, April 24th, I communicated with her son, Jude, to arrange this appt for a TOC visit s/p her hospitalization.   She had no warning until that very night that her symptoms started.  She was nauseated and in terrible pain.  Jude had contacted me and I was concerned she had a complication for her known hernia.  I could hear her distress by phone so we opted to send her to the ED.  There, she got morphine, later NGT to decompress--had continued output from colostomy the whole time.  She was seen by Dr. Donne Hazel who shared that she actually had three hernias, but the opening in her tissues where the bowel goes through is quite large so she's unlikely to develop incarceration related to it.  She has actually 3 hernias--hiatal and her colostomy hernia.    She feels just back to herself.  She is fine, she says.    CBGs were very high in the hospital.  146-148 in am fasting past couple of days.  145, 147, 135 156, end of April.    She got home, made soup--gumbo, baked biscuits.    She is not walking down the driveway by herself anymore.  She walks around inside with her walker--rollator, has quad can and upright cane. Balance is not as good.   Left  eye:  had burst blood vessel when NGT placed.  Used pataday due to itching and warm compresses.    Fingers on left hand (last two) work normally now.  She still gets like a streak of tingling in her arm and leg off and on.  Feels well.    No pains.  Takes her tylenol if she aches all over.    The night before last, she woke up sob--she has to sit up and then she can breathe.  We reviewed the echo I'd ordered which could not confirm or deny diastolic dysfunction, but systolic and valvular functions were good.  Her hiatal hernia is quite large so it's interestnig how sitting and standing up relieve her symptoms.    POX stays 100% when she has the episodes (had one in hospital this time and one when she had the stroke in [place]).     Past Medical History:  Diagnosis Date   Allergic rhinitis    Per Records from Dr.Altheimer    Anemia    Per Records from Dr.Altheimer    Arthralgia of multiple sites    Per Records from [place]    B12 deficiency    Per Records from [place]    Baker's cyst    Per Records from [place]    Bladder cancer [facility])    CKD (chronic kidney disease) stage 3, GFR 30-59 ml/min ([place])    Per Records from [place]    Dehydration    Per Records from [place]    Dextroscoliosis    Per Records from [place]    Diabetes [facility])    Diverticulitis    Per Records from [place]    Diverticulosis    Fecal occult blood test positive    Per Records from [facility]    Gout    left foot   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    Hypertension    Per Records from Dr.Altheimer    Hypocalcemia    Per Records from Dr.Altheimer    Hypomagnesemia    Per Records from Dr.Altheimer    Iron deficiency    Per Records from [place]    Large bowel obstruction ([place])    Per Records from [place]    Mixed dyslipidemia    Per Records from Dr.Altheimer    Myopathy    Per Records from Dr.Altheimer    Onychomycosis    Per Records from Dr.Altheimer    Peripheral neuropathy    Per Records from [place]    Recurrent UTI    Vitamin D deficiency    Per Records from [place]     Past Surgical History:  Procedure Laterality Date   BLADDER SUSPENSION     Per Records from [place], [place]   hysterectomy  1966   ILEOSTOMY  2017   KYPHOPLASTY  2019   MASTECTOMY Right 1998   MASTECTOMY Left 2011   TRANSURETHRAL RESECTION OF BLADDER TUMOR      Allergies  Allergen Reactions   Codeine Itching, Nausea Only and Swelling   Flexeril [Cyclobenzaprine]     Per Records from [place]     Outpatient Encounter Medications as of 07/09/2021  Medication Sig   acetaminophen (TYLENOL) 500 MG  tablet Take 500 mg by mouth daily as needed (pain).   alendronate (FOSAMAX) 70 MG tablet TAKE 1 TABLET BY MOUTH ONCE A WEEK WITH A FULL GLASS OF WATER ON AN EMPTY  STOMACH   ASPIRIN LOW DOSE 81 MG EC tablet Take 81 mg by mouth daily.   carbamide peroxide (DEBROX) 6.5 % OTIC solution Place 5 drops into both ears at bedtime as needed (x 1 week). (Patient taking differently: Place 1 drop into both ears See admin instructions. 1-2 times a week as needed)   Cholecalciferol (D3 HIGH POTENCY) 50 MCG (2000 UT) CAPS Take 1 capsule (2,000 Units total) by mouth daily. Needs an appointment before anymore future refills.   diphenoxylate-atropine (LOMOTIL) 2.5-0.025 MG tablet TAKE 1 TABLET BY MOUTH EVERY MORNING, 1 TABLET AT LUNCH, AND 1 TABLET AT dinner (Patient taking differently: Take 1 tablet by mouth in the morning, at noon, and at bedtime.)   hydrALAZINE (APRESOLINE) 25 MG tablet Take 25 mg by mouth 3 (three) times daily.   iron polysaccharides (FERREX 150) 150 MG capsule Take one capsule by mouth once daily. Needs an appointment before anymore future refills. (Patient taking differently: Take 150 mg by mouth daily. Take one capsule by mouth once daily. Needs an appointment before anymore future refills.)   JARDIANCE 10 MG TABS tablet Take 10 mg by mouth daily.   loperamide (IMODIUM) 2 MG capsule TAKE 1 CAPSULE BY MOUTH EVERY MORNING, 1 CAPSULE AT LUNCH, AND 1 CAPSULE AT dinner (Patient taking differently: Take 2 mg by mouth in the morning, at noon, and at bedtime.)   ondansetron (ZOFRAN-ODT) 4 MG disintegrating tablet Take 1 tablet (4 mg total) by mouth every 8 (eight) hours as needed for nausea or vomiting. (Patient taking differently: Take 4 mg by mouth daily as needed for nausea or vomiting.)   OVER THE COUNTER MEDICATION Take 2 capsules by mouth daily as needed (Flat[place]).   psyllium (METAMUCIL) 58.6 % packet Take 1 packet by mouth at bedtime.   rosuvastatin (CRESTOR) 10 MG tablet Take 10 mg by mouth  daily.   sodium bicarbonate 650 MG tablet Take 1 tablet (650 mg total) by mouth 2 (two) times daily. Needs an appointment before anymore future refills.   traMADol (ULTRAM) 50 MG tablet Take one tablet by mouth every twelve hours as needed for severe pain. Needs an appointment before anymore future refills. (Patient taking differently: Take 50 mg by mouth daily as needed (pain).)   vitamin C (ASCORBIC ACID) 500 MG tablet Take 1 tablet (500 mg total) by mouth daily. Needs an appointment before anymore future refills.   zolpidem (AMBIEN) 5 MG tablet TAKE 1 TABLET BY MOUTH AT BEDTIME AS NEEDED FOR SLEEP (Patient taking differently: Take 5 mg by mouth at bedtime as needed for sleep.)   No facility-administered encounter medications on file as of 07/09/2021.    Review of Systems:  Review of Systems  Constitutional:  Negative for chills, fever, malaise/fatigue and weight loss.  HENT:  Negative for congestion and hearing loss.   Eyes:  Positive for redness. Negative for blurred vision, photophobia and pain.  Respiratory:  Negative for cough and shortness of breath.   Cardiovascular:  Positive for orthopnea and PND. Negative for chest pain, palpitations and leg swelling.  Gastrointestinal:  Negative for abdominal pain, blood in stool, constipation, diarrhea, heartburn, melena, nausea and vomiting.  Genitourinary:  Negative for dysuria.  Musculoskeletal:  Positive for back pain and joint pain. Negative for falls.  Skin:  Negative for itching and rash.  Neurological:  Positive for tingling and sensory change. Negative for dizziness, loss of consciousness and headaches.  Endo/Heme/Allergies:  Does not bruise/bleed easily.  Psychiatric/Behavioral:  Negative for depression and memory  loss. The patient is not nervous/anxious and does not have insomnia.    Health Maintenance  Topic Date Due   TETANUS/TDAP  Never done   DEXA SCAN  Never done   OPHTHALMOLOGY EXAM  07/22/2019   COVID-19 Vaccine (4 - Booster  for Pfizer series) 02/06/2020   HEMOGLOBIN A1C  11/13/2020   FOOT EXAM  11/26/2020   INFLUENZA VACCINE  10/07/2021   Pneumonia Vaccine 48+ Years old  Completed   Zoster Vaccines- Shingrix  Completed   HPV VACCINES  Aged Out    Physical Exam: There were no vitals filed for this visit. There is no height or weight on file to calculate BMI. Physical Exam Vitals reviewed.  Constitutional:      General: She is not in acute distress.    Appearance: Normal appearance. She is not toxic-appearing.  HENT:     Head: Normocephalic and atraumatic.     Right Ear: External ear normal.     Left Ear: External ear normal.     Nose: Nose normal.     Mouth/Throat:     Pharynx: Oropharynx is clear.  Eyes:     Extraocular Movements: Extraocular movements intact.     Conjunctiva/sclera: Conjunctivae normal.     Pupils: Pupils are equal, round, and reactive to light.     Comments: Left sclera entirely red on inferior aspect  Cardiovascular:     Rate and Rhythm: Normal rate and regular rhythm.     Pulses: Normal pulses.     Heart sounds: Normal heart sounds.     Comments: Abdominal sounds heard in left chest Pulmonary:     Effort: Pulmonary effort is normal.     Breath sounds: Normal breath sounds. No wheezing, rhonchi or rales.  Abdominal:     General: Bowel sounds are normal. There is no distension.     Palpations: Abdomen is soft.     Tenderness: There is no abdominal tenderness. There is no guarding or rebound.     Hernia: A hernia is present.     Comments: Ostomy with soft brown stool--pt actually emptied back due to fullness and air during visit; has both ventral and parastomal hernias visible  Musculoskeletal:        General: Normal range of motion.     Cervical back: Normal range of motion and neck supple.     Right lower leg: No edema.     Left lower leg: No edema.  Skin:    General: Skin is warm and dry.     Coloration: Skin is not pale.  Neurological:     General: No focal  deficit present.     Mental Status: She is alert and oriented to person, place, and time.     Comments: Foot drop appears to be gone, using rollator walker now and appears more steady with it  Psychiatric:        Mood and Affect: Mood normal.        Behavior: Behavior normal.        Thought Content: Thought content normal.        Judgment: Judgment normal.    Labs reviewed: Basic Metabolic Panel: Recent Labs    06/24/21 0934 06/25/21 0045 06/26/21 0318 06/27/21 0021 06/28/21 0039  NA 139   < > 140 137 137  K 4.0   < > 4.0 3.3* 4.0  CL 106   < > 113* 109 113*  CO2 21*   < > 18* 21* 19*  GLUCOSE  251*   < > 92 91 224*  BUN 26*   < > $R'20 20 21  'vL$ CREATININE 1.68*   < > 1.53* 1.41* 1.22*  CALCIUM 9.7   < > 8.6* 8.7* 8.8*  MG 1.7  --   --   --   --    < > = values in this interval not displayed.   Liver Function Tests: Recent Labs    06/24/21 0934  AST 22  ALT 22  ALKPHOS 37*  BILITOT 0.7  PROT 7.2  ALBUMIN 4.3   Recent Labs    06/24/21 0934  LIPASE 59*   No results for input(s): AMMONIA in the last 8760 hours. CBC: Recent Labs    06/24/21 0934 06/25/21 0045 06/27/21 0021  WBC 7.5 8.0 7.4  HGB 11.8* 9.9* 10.0*  HCT 35.1* 30.4* 30.4*  MCV 95.9 95.6 95.3  PLT 214 187 187   Lipid Panel: No results for input(s): CHOL, HDL, LDLCALC, TRIG, CHOLHDL, LDLDIRECT in the last 8760 hours. Lab Results  Component Value Date   HGBA1C 7.0 (H) 05/13/2020    Procedures since last visit: CT ABDOMEN PELVIS WO CONTRAST  Result Date: 06/24/2021 CLINICAL DATA:  88 year old female with acute abdominal pain. EXAM: CT ABDOMEN AND PELVIS WITHOUT CONTRAST TECHNIQUE: Multidetector CT imaging of the abdomen and pelvis was performed following the standard protocol without IV contrast. RADIATION DOSE REDUCTION: This exam was performed according to the departmental dose-optimization program which includes automated exposure control, adjustment of the mA and/or kV according to patient size  and/or use of iterative reconstruction technique. COMPARISON:  None FINDINGS: Lower chest: Minimal basilar scarring atelectasis. Large hiatal hernia. Signs of coronary artery disease. Heart is incompletely imaged. Hepatobiliary: Noncontrast appearance of liver is unremarkable. Post cholecystectomy with mild extrahepatic biliary duct distension. Pancreas: Signs of pancreatic atrophy without signs of inflammation. Spleen: Normal in size and contour with signs of prior granulomatous disease. Adrenals/Urinary Tract: Adrenal glands are normal. Urinary bladder is nondistended without signs of adjacent inflammation. Smooth contour the bilateral kidneys without signs of nephrolithiasis or hydronephrosis. 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 Rectal pouch. Moderate to marked distension of the stomach with large hiatal hernia. Signs of bowel obstruction with dilated jejunum in the pelvis, focal outpouching from the jejunum on image 49/3 measuring 2.7 x 1.8 cm near the site of bowel transition. Second less distended loop of bowel just beyond the site of transition extending from the pelvis into the lower abdomen. No substantial stranding around these areas on the current study small areas of gas-filled outpouching from other less distended loops of jejunum compatible with jejunal diverticulosis. Distal small bowel loops herniated through a midline hernia with rectus diastasis. Site of obstruction not in this area nor associated with parastomal herniation of small bowel loops about the RIGHT lower quadrant ileostomy. No signs of pneumatosis. No ascites. Vascular/Lymphatic: Aortic atherosclerosis, no aneurysmal dilation. No adenopathy in the abdomen or in the pelvis. Reproductive: Post hysterectomy without sign of adnexal mass. Other: No ascites or free air. Musculoskeletal: Osteopenia. Signs of prior trauma to the bony pelvis with healed bilateral pubic bone fractures. Healed  rib fractures about the LEFT chest. T12 compression fracture with cement augmentation near complete loss of height at this level. Dextroconvex scoliotic curvature and degenerative changes of the lumbar spine. IMPRESSION: 1. Signs of bowel obstruction with both gastric and proximal small bowel distension. Stool like material in the lumen of the dilated small bowel raising the  question of some chronicity. Findings likely due to complex adhesions in the pelvis and associated with completely decompressed or nearly completely decompressed bowel beyond the site of obstruction. 2. Focal outpouching from the jejunum with other smaller areas of outpouching compatible with jejunal diverticulosis. No current stranding surrounding these areas or signs of free air. No priors are available for comparison. If the patient's symptoms should worsen would consider evaluation with enteric contrast. 3. 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 4. Large hiatal hernia with distended stomach extending into the chest. 5. Ventral abdominal wall hernia and parastomal hernia containing small bowel loops. These areas do not represent the site of obstruction. 6. Signs of coronary artery disease. 7. Osteopenia with signs of prior trauma to the bony pelvis with healed bilateral pubic bone fractures. T12 compression fracture with cement augmentation near complete loss of height at this level. 8. Aortic atherosclerosis. Aortic Atherosclerosis (ICD10-I70.0). Electronically Signed   By: Zetta Bills M.D.   On: 06/24/2021 11:29   DG Abd Portable 1V-Small Bowel Obstruction Protocol-initial, 8 hr delay  Result Date: 06/25/2021 CLINICAL DATA:  Small bowel protocol, contrast given 8 hours ago, check nasogastric tube position. EXAM: PORTABLE ABDOMEN - 1 VIEW COMPARISON:  June 25, 2021 (7:36 a.m.) FINDINGS: A nasogastric tube is seen with its distal tip overlying the expected region of the gastric antrum. This is  approximately 7.3 cm distal to the expected region of the gastroesophageal junction and stable in location when compared to the prior study. The bowel gas pattern is normal. No radiopaque contrast is seen within the large or small bowel. No radio-opaque calculi or other significant radiographic abnormality are seen. IMPRESSION: Nasogastric tube positioning, as described above. Electronically Signed   By: Virgina Norfolk M.D.   On: 06/25/2021 18:27   DG Abd Portable 1V  Result Date: 06/25/2021 CLINICAL DATA:  88 year old female status post nasogastric tube placement. EXAM: PORTABLE ABDOMEN - 1 VIEW COMPARISON:  06/24/2021. FINDINGS: Tip of nasogastric tube is in the distal body of the stomach. Side port is near the gastroesophageal junction. Visualized bowel gas pattern is unremarkable. Visualized portions of the thorax demonstrate multiple old healed left-sided rib fractures. Atherosclerotic calcifications are also noted in the thoracic aorta. Surgical clips in the right axilla, likely from prior lymph node dissection. Post vertebroplasty changes are noted at the level of T12. Numerous vascular calcifications in the upper abdomen. IMPRESSION: 1. Tip of nasogastric tube is in the distal body of the stomach. Electronically Signed   By: Vinnie Langton M.D.   On: 06/25/2021 07:51   DG Abd Portable 1V-Small Bowel Protocol-Position Verification  Result Date: 06/24/2021 CLINICAL DATA:  Nasogastric tube placement EXAM: PORTABLE ABDOMEN - 1 VIEW COMPARISON:  None. FINDINGS: Lungs are clear. No pneumothorax or pleural effusion. Nasogastric tube is tortuous with its tip terminating within the hiatal hernia, better seen on CT examination of 06/24/2021. Cardiac size is within normal limits. Pulmonary vascularity is normal. Surgical clips are seen within the right axilla. T12 vertebroplasty has been performed. IMPRESSION: Nasogastric tube tip within the intrathoracic hiatal hernia. Electronically Signed   By: Fidela Salisbury M.D.   On: 06/24/2021 19:07   DG Abd Portable 1V-Small Bowel Protocol-Position Verification  Result Date: 06/24/2021 CLINICAL DATA:  Enteric catheter placement EXAM: PORTABLE ABDOMEN - 1 VIEW COMPARISON:  03/07/2018, 06/24/2021 FINDINGS: 2 frontal views of the lower chest and upper abdomen demonstrates tip and side port of an enteric catheter projecting in the region of  the large hiatal hernia seen on recent CT. Lung bases are clear. Minimal gaseous distention of the stomach and small bowel unchanged. No masses or abnormal calcifications. IMPRESSION: 1. Enteric catheter tip and side port projecting over the large hiatal hernia within the lower central chest. Electronically Signed   By: Randa Ngo M.D.   On: 06/24/2021 15:30    Assessment/Plan 1. Type 2 diabetes mellitus with stage 3a chronic kidney disease, with long-term current use of insulin (HCC) -control still not where it should be so will up jardiance from $RemoveBefore'10mg'AqPgCgVYGeFxM$  to $R'25mg'og$  as renal function was just fine during admission -she's been tachycardic so advised pushing fluids (regular rate) - empagliflozin (JARDIANCE) 25 MG TABS tablet; Take 1 tablet (25 mg total) by mouth daily before breakfast.  Dispense: 90 tablet; Refill: 3  2. Essential hypertension -at goal, continue current regimen, monitor  3. 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 Hiatal hernia -signficant and my impression is that this may cause some of her spells of paroxysmal nocturnal dyspnea as they do wane when she sits or stands up--anxiety seems to come after the dyspnea rather than like a panic attack would  5. Ventral hernia without obstruction or gangrene -was noted on CT but not involved in bowel obstruction which resolved  6. Parastomal hernia without obstruction or gangrene -noted on imaging, but not problematic, no surgery indicated (nor recommended at her advanced age)  35. Sinus tachycardia -push  fluids which she's not been as good about the past week since returning home--will get back up to normal and hopefully this will resolve, Jude is going to monitor this and will let me know if she's not getting back down under 90--she's asymptomatic though he and I both noted that she was breathing faster, as well   Labs/tests ordered:  plan on BMP before she goes back to [place] (home for her)  Next appt:  TBD depending on her travel schedule  Burkley Dech L. Vernon Prey, CMD [facility]  [address] [place], [place] [address] Office (8:30am-5pm):  [phone]) Mobile (8:30am-5pm):  [phone] On call (after 5pm):  [phone]

## 2021-07-09 NOTE — Progress Notes (Signed)
Renew tramadol ?

## 2021-07-18 ENCOUNTER — Encounter: Payer: Self-pay | Admitting: *Deleted

## 2021-07-18 NOTE — Progress Notes (Signed)
Order for a portable EKG requested by Dr. Hollace Kinnier. Demographics and order faxed to [facility] (formerly known as [facility]). Confirmation fax received. ?

## 2021-11-02 ENCOUNTER — Inpatient Hospital Stay (HOSPITAL_COMMUNITY)
Admission: EM | Admit: 2021-11-02 | Discharge: 2021-11-10 | DRG: 493 | Disposition: A | Discharge status: Skilled Nursing Facility | Payer: Medicare Other | Attending: Internal Medicine | Admitting: Internal Medicine

## 2021-11-02 ENCOUNTER — Other Ambulatory Visit: Payer: Self-pay

## 2021-11-02 ENCOUNTER — Emergency Department (HOSPITAL_COMMUNITY): Payer: Medicare Other

## 2021-11-02 DIAGNOSIS — I440 Atrioventricular block, first degree: Secondary | ICD-10-CM

## 2021-11-02 DIAGNOSIS — M25552 Pain in left hip: Secondary | ICD-10-CM

## 2021-11-02 DIAGNOSIS — Z79899 Other long term (current) drug therapy: Secondary | ICD-10-CM

## 2021-11-02 DIAGNOSIS — W010XXA Fall on same level from slipping, tripping and stumbling without subsequent striking against object, initial encounter: Secondary | ICD-10-CM | POA: Diagnosis present

## 2021-11-02 DIAGNOSIS — Z820 Family history of epilepsy and other diseases of the nervous system: Secondary | ICD-10-CM

## 2021-11-02 DIAGNOSIS — S42302A Unspecified fracture of shaft of humerus, left arm, initial encounter for closed fracture: Secondary | ICD-10-CM | POA: Diagnosis present

## 2021-11-02 DIAGNOSIS — E1122 Type 2 diabetes mellitus with diabetic chronic kidney disease: Secondary | ICD-10-CM | POA: Diagnosis present

## 2021-11-02 DIAGNOSIS — Y92019 Unspecified place in single-family (private) house as the place of occurrence of the external cause: Secondary | ICD-10-CM

## 2021-11-02 DIAGNOSIS — E1142 Type 2 diabetes mellitus with diabetic polyneuropathy: Secondary | ICD-10-CM | POA: Diagnosis present

## 2021-11-02 DIAGNOSIS — S32591A Other specified fracture of right pubis, initial encounter for closed fracture: Secondary | ICD-10-CM | POA: Diagnosis present

## 2021-11-02 DIAGNOSIS — Z833 Family history of diabetes mellitus: Secondary | ICD-10-CM

## 2021-11-02 DIAGNOSIS — N1832 Chronic kidney disease, stage 3b: Secondary | ICD-10-CM | POA: Diagnosis present

## 2021-11-02 DIAGNOSIS — E538 Deficiency of other specified B group vitamins: Secondary | ICD-10-CM | POA: Diagnosis present

## 2021-11-02 DIAGNOSIS — M109 Gout, unspecified: Secondary | ICD-10-CM | POA: Diagnosis present

## 2021-11-02 DIAGNOSIS — D62 Acute posthemorrhagic anemia: Secondary | ICD-10-CM

## 2021-11-02 DIAGNOSIS — R000 Tachycardia, unspecified: Secondary | ICD-10-CM | POA: Diagnosis present

## 2021-11-02 DIAGNOSIS — M549 Dorsalgia, unspecified: Secondary | ICD-10-CM | POA: Diagnosis present

## 2021-11-02 DIAGNOSIS — Z7984 Long term (current) use of oral hypoglycemic drugs: Secondary | ICD-10-CM

## 2021-11-02 DIAGNOSIS — E785 Hyperlipidemia, unspecified: Secondary | ICD-10-CM | POA: Diagnosis present

## 2021-11-02 DIAGNOSIS — S42309A Unspecified fracture of shaft of humerus, unspecified arm, initial encounter for closed fracture: Secondary | ICD-10-CM | POA: Diagnosis not present

## 2021-11-02 DIAGNOSIS — E782 Mixed hyperlipidemia: Secondary | ICD-10-CM | POA: Diagnosis present

## 2021-11-02 DIAGNOSIS — R739 Hyperglycemia, unspecified: Secondary | ICD-10-CM

## 2021-11-02 DIAGNOSIS — Z8551 Personal history of malignant neoplasm of bladder: Secondary | ICD-10-CM

## 2021-11-02 DIAGNOSIS — Z811 Family history of alcohol abuse and dependence: Secondary | ICD-10-CM

## 2021-11-02 DIAGNOSIS — Z885 Allergy status to narcotic agent status: Secondary | ICD-10-CM

## 2021-11-02 DIAGNOSIS — E118 Type 2 diabetes mellitus with unspecified complications: Secondary | ICD-10-CM | POA: Diagnosis present

## 2021-11-02 DIAGNOSIS — M545 Low back pain, unspecified: Secondary | ICD-10-CM | POA: Diagnosis present

## 2021-11-02 DIAGNOSIS — S42222A 2-part displaced fracture of surgical neck of left humerus, initial encounter for closed fracture: Secondary | ICD-10-CM

## 2021-11-02 DIAGNOSIS — Z7983 Long term (current) use of bisphosphonates: Secondary | ICD-10-CM

## 2021-11-02 DIAGNOSIS — Z853 Personal history of malignant neoplasm of breast: Secondary | ICD-10-CM

## 2021-11-02 DIAGNOSIS — G8929 Other chronic pain: Secondary | ICD-10-CM

## 2021-11-02 DIAGNOSIS — I10 Essential (primary) hypertension: Secondary | ICD-10-CM | POA: Diagnosis present

## 2021-11-02 DIAGNOSIS — Z9013 Acquired absence of bilateral breasts and nipples: Secondary | ICD-10-CM

## 2021-11-02 DIAGNOSIS — Z6372 Alcoholism and drug addiction in family: Secondary | ICD-10-CM

## 2021-11-02 DIAGNOSIS — Z932 Ileostomy status: Secondary | ICD-10-CM

## 2021-11-02 DIAGNOSIS — W19XXXA Unspecified fall, initial encounter: Principal | ICD-10-CM

## 2021-11-02 DIAGNOSIS — S42232A 3-part fracture of surgical neck of left humerus, initial encounter for closed fracture: Secondary | ICD-10-CM | POA: Diagnosis not present

## 2021-11-02 DIAGNOSIS — Z888 Allergy status to other drugs, medicaments and biological substances status: Secondary | ICD-10-CM

## 2021-11-02 DIAGNOSIS — F5101 Primary insomnia: Secondary | ICD-10-CM

## 2021-11-02 DIAGNOSIS — Z800 Family history of malignant neoplasm of digestive organs: Secondary | ICD-10-CM

## 2021-11-02 DIAGNOSIS — Z7982 Long term (current) use of aspirin: Secondary | ICD-10-CM

## 2021-11-02 DIAGNOSIS — S32592A Other specified fracture of left pubis, initial encounter for closed fracture: Secondary | ICD-10-CM | POA: Diagnosis present

## 2021-11-02 DIAGNOSIS — Z823 Family history of stroke: Secondary | ICD-10-CM

## 2021-11-02 DIAGNOSIS — Z8249 Family history of ischemic heart disease and other diseases of the circulatory system: Secondary | ICD-10-CM

## 2021-11-02 DIAGNOSIS — E1165 Type 2 diabetes mellitus with hyperglycemia: Secondary | ICD-10-CM | POA: Diagnosis present

## 2021-11-02 DIAGNOSIS — D509 Iron deficiency anemia, unspecified: Secondary | ICD-10-CM | POA: Diagnosis present

## 2021-11-02 LAB — CBC WITH DIFFERENTIAL/PLATELET
Abs Immature Granulocytes: 0.02 10*3/uL (ref 0.00–0.07)
Basophils Absolute: 0 10*3/uL (ref 0.0–0.1)
Basophils Relative: 1 %
Eosinophils Absolute: 0 10*3/uL (ref 0.0–0.5)
Eosinophils Relative: 1 %
HCT: 34.3 % — ABNORMAL LOW (ref 36.0–46.0)
Hemoglobin: 11 g/dL — ABNORMAL LOW (ref 12.0–15.0)
Immature Granulocytes: 0 %
Lymphocytes Relative: 38 %
Lymphs Abs: 2.5 10*3/uL (ref 0.7–4.0)
MCH: 31.4 pg (ref 26.0–34.0)
MCHC: 32.1 g/dL (ref 30.0–36.0)
MCV: 98 fL (ref 80.0–100.0)
Monocytes Absolute: 0.4 10*3/uL (ref 0.1–1.0)
Monocytes Relative: 6 %
Neutro Abs: 3.6 10*3/uL (ref 1.7–7.7)
Neutrophils Relative %: 54 %
Platelets: 170 10*3/uL (ref 150–400)
RBC: 3.5 MIL/uL — ABNORMAL LOW (ref 3.87–5.11)
RDW: 13.8 % (ref 11.5–15.5)
WBC: 6.5 10*3/uL (ref 4.0–10.5)
nRBC: 0 % (ref 0.0–0.2)

## 2021-11-02 LAB — COMPREHENSIVE METABOLIC PANEL
ALT: 22 U/L (ref 0–44)
AST: 22 U/L (ref 15–41)
Albumin: 3.7 g/dL (ref 3.5–5.0)
Alkaline Phosphatase: 36 U/L — ABNORMAL LOW (ref 38–126)
Anion gap: 11 (ref 5–15)
BUN: 28 mg/dL — ABNORMAL HIGH (ref 8–23)
CO2: 19 mmol/L — ABNORMAL LOW (ref 22–32)
Calcium: 9 mg/dL (ref 8.9–10.3)
Chloride: 107 mmol/L (ref 98–111)
Creatinine, Ser: 1.41 mg/dL — ABNORMAL HIGH (ref 0.44–1.00)
GFR, Estimated: 34 mL/min — ABNORMAL LOW (ref >60)
Glucose, Bld: 249 mg/dL — ABNORMAL HIGH (ref 70–99)
Potassium: 4.1 mmol/L (ref 3.5–5.1)
Sodium: 137 mmol/L (ref 135–145)
Total Bilirubin: 0.7 mg/dL (ref 0.3–1.2)
Total Protein: 6.3 g/dL — ABNORMAL LOW (ref 6.5–8.1)

## 2021-11-02 MED ORDER — FENTANYL CITRATE PF 50 MCG/ML IJ SOSY
50.0000 ug | PREFILLED_SYRINGE | Freq: Once | INTRAMUSCULAR | Status: AC
  Administered 2021-11-02: 50 ug via INTRAVENOUS
  Filled 2021-11-02: qty 1

## 2021-11-02 NOTE — ED Provider Notes (Signed)
[facility] DEPT Provider Note   CSN: [id number] Arrival date & time: 11/02/21  2036     History  Chief Complaint  Patient presents with   Lytle Michaels    Cheryl Salas is a 88 y.o. female.  HPI     88 year old female with a history of CKD stage III, diabetes type 2, hypertension, hyperlipidemia, diverticulitis status post ileostomy, hysterectomy/bladder suspension, history of bladder/breast cancer with bilateral mastectomy, history of chronic low back pain with multilevel degenerative disc disease for which she sees Dr. Nelva Bush, who presents with concern for mechanical fall on her left shoulder with left shoulder pain.  Reports that she normally walks with her walker, however stepped away from it and tripped and fell landing on her left shoulder.  Reports she only has left shoulder pain.  Denies hitting her head, headache, loss of consciousness, neck pain, numbness, weakness, chest pain, abdominal pain, shortness of breath, nausea, vomiting, recent illness.  Reports that she has had back pain and most recently had an injection on 22 August.  Reports that the injection has not seemed to work yet.  Son does report that she had a very long day of travel with airline regarding delays in this likely contributed to her worsening back pain.  Reports that her back pain is lumbar and radiates lower.  Denies any numbness, weakness.  Reports that the pain has not changed since her fall that her fall was on her shoulder.   Past Medical History:  Diagnosis Date   Allergic rhinitis    Per Records from Dr.Altheimer    Anemia    Per Records from Dr.Altheimer    Arthralgia of multiple sites    Per Records from [place]    B12 deficiency    Per Records from [place]    Baker's cyst    Per Records from [place]    Bladder cancer [facility])    CKD (chronic kidney disease) stage 3, GFR 30-59 ml/min (HCC)    Per Records from [place]    Dehydration    Per Records  from [place]    Dextroscoliosis    Per Records from [place]    Diabetes [facility])    Diverticulitis    Per Records from [place]    Diverticulosis    Fecal occult blood test positive    Per Records from Dr.Altheimer    Gout    left foot   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[place])    Per Records from [place]    Hypertension    Per Records from Dr.Altheimer    Hypocalcemia    Per Records from Dr.Altheimer    Hypomagnesemia    Per Records from Dr.Altheimer    Iron deficiency    Per Records from [place]    Large bowel obstruction ([place])    Per Records from [place]    Mixed dyslipidemia    Per Records from Dr.Altheimer    Myopathy    Per Records from Dr.Altheimer    Onychomycosis    Per Records from Dr.Altheimer    Peripheral neuropathy    Per Records from Dr.Altheimer    Recurrent UTI    Vitamin D deficiency    Per Records from [place] Medications Prior to Admission medications   Medication Sig Start Date End Date Taking? Authorizing Provider  alendronate (FOSAMAX) 70 MG tablet TAKE 1 TABLET BY MOUTH ONCE A WEEK WITH A FULL GLASS OF WATER ON AN EMPTY STOMACH 04/15/20  Yes Mariea Clonts, Tiffany L,  DO  ASPIRIN LOW DOSE 81 MG EC tablet Take 81 mg by mouth daily. 06/09/21  Yes [provider]  Cholecalciferol (D3 HIGH POTENCY) 50 MCG (2000 UT) CAPS Take 1 capsule (2,000 Units total) by mouth daily. Needs an appointment before anymore future refills. 07/23/20  Yes Wardell Honour, MD  diphenoxylate-atropine (LOMOTIL) 2.5-0.025 MG tablet TAKE 1 TABLET BY MOUTH EVERY MORNING, 1 TABLET AT LUNCH, AND 1 TABLET AT dinner Patient taking differently: Take 1 tablet by mouth in the morning, at noon, and at bedtime. 03/26/20  Yes Reed, Tiffany L, DO  empagliflozin (JARDIANCE) 25 MG TABS tablet Take 1 tablet (25 mg total) by mouth daily before breakfast. 07/09/21  Yes Reed, Tiffany L, DO  hydrALAZINE (APRESOLINE) 25 MG tablet Take 25 mg by mouth 3 (three) times daily.  06/09/21  Yes [provider]  iron polysaccharides (FERREX 150) 150 MG capsule Take one capsule by mouth once daily. Needs an appointment before anymore future refills. Patient taking differently: Take 150 mg by mouth daily. Take one capsule by mouth once daily. Needs an appointment before anymore future refills. 07/23/20  Yes Wardell Honour, MD  loperamide (IMODIUM) 2 MG capsule TAKE 1 CAPSULE BY MOUTH EVERY MORNING, 1 CAPSULE AT LUNCH, AND 1 CAPSULE AT dinner Patient taking differently: Take 2 mg by mouth in the morning, at noon, and at bedtime. 03/26/20  Yes Reed, Tiffany L, DO  rosuvastatin (CRESTOR) 10 MG tablet Take 10 mg by mouth daily. 06/09/21  Yes [provider]  sodium bicarbonate 650 MG tablet Take 1 tablet (650 mg total) by mouth 2 (two) times daily. Needs an appointment before anymore future refills. 07/23/20  Yes Wardell Honour, MD  traMADol (ULTRAM) 50 MG tablet Take 1 tablet (50 mg total) by mouth daily as needed (pain). 07/09/21  Yes Reed, Tiffany L, DO  vitamin C (ASCORBIC ACID) 500 MG tablet Take 1 tablet (500 mg total) by mouth daily. Needs an appointment before anymore future refills. 07/23/20  Yes Wardell Honour, MD  zolpidem (AMBIEN) 5 MG tablet TAKE 1 TABLET BY MOUTH AT BEDTIME AS NEEDED FOR SLEEP Patient taking differently: Take 5 mg by mouth at bedtime as needed for sleep. 03/26/20  Yes Reed, Tiffany L, DO  carbamide peroxide (DEBROX) 6.5 % OTIC solution Place 5 drops into both ears at bedtime as needed (x 1 week). Patient not taking: Reported on 11/02/2021 11/23/19   Mariea Clonts, Tiffany L, DO  ondansetron (ZOFRAN-ODT) 4 MG disintegrating tablet Take 1 tablet (4 mg total) by mouth every 8 (eight) hours as needed for nausea or vomiting. Patient not taking: Reported on 11/02/2021 06/05/19   Hollace Kinnier L, DO  psyllium (METAMUCIL) 58.6 % packet Take 1 packet by mouth at bedtime. Patient not taking: Reported on 11/02/2021 02/09/18   Hollace Kinnier L, DO       Allergies    Codeine and Flexeril [cyclobenzaprine]    Review of Systems   Review of Systems  Physical Exam Updated Vital Signs BP 130/74 (BP Location: Right Arm)   Pulse (!) 101   Temp 98 F (36.7 C) (Oral)   Resp 18   Ht '4\' 11"'$  (1.499 m)   Wt 58.3 kg   SpO2 96%   BMI 25.96 kg/m  Physical Exam Vitals and nursing note reviewed.  Constitutional:      General: She is not in acute distress.    Appearance: She is well-developed. She is not diaphoretic.  HENT:     Head:  Normocephalic and atraumatic.  Eyes:     Conjunctiva/sclera: Conjunctivae normal.  Cardiovascular:     Rate and Rhythm: Normal rate and regular rhythm.     Heart sounds: Normal heart sounds. No murmur heard.    No friction rub. No gallop.  Pulmonary:     Effort: Pulmonary effort is normal. No respiratory distress.     Breath sounds: Normal breath sounds. No wheezing or rales.  Chest:     Chest wall: No tenderness.  Abdominal:     General: There is no distension.     Palpations: Abdomen is soft.     Tenderness: There is no abdominal tenderness. There is no guarding.  Musculoskeletal:        General: Swelling (large contusion anterior/superior shoulder), tenderness and deformity present.     Cervical back: Normal range of motion.     Comments: Normal upper and lower extremity pulses, normal sensation upper and lower extremities, normal strength bilateral LE   Skin:    General: Skin is warm and dry.     Findings: No erythema or rash.  Neurological:     Mental Status: She is alert and oriented to person, place, and time.     ED Results / Procedures / Treatments   Labs (all labs ordered are listed, but only abnormal results are displayed) Labs Reviewed  CBC WITH DIFFERENTIAL/PLATELET - Abnormal; Notable for the following components:      Result Value   RBC 3.50 (*)    Hemoglobin 11.0 (*)    HCT 34.3 (*)    All other components within normal limits  COMPREHENSIVE METABOLIC PANEL - Abnormal;  Notable for the following components:   CO2 19 (*)    Glucose, Bld 249 (*)    BUN 28 (*)    Creatinine, Ser 1.41 (*)    Total Protein 6.3 (*)    Alkaline Phosphatase 36 (*)    GFR, Estimated 34 (*)    All other components within normal limits    EKG None  Radiology DG Shoulder Left  Result Date: 11/02/2021 CLINICAL DATA:  Fall. EXAM: LEFT SHOULDER - 2+ VIEW COMPARISON:  None Available. FINDINGS: There is an acute mildly comminuted fracture through the left humeral neck. There is 1 shaft with lateral displacement of the distal fracture fragment. There is apex lateral angulation. No dislocation. There are mild degenerative changes of the acromioclavicular joint. There are multiple healed left-sided rib fractures. IMPRESSION: 1. Acute displaced left humeral neck fracture. Electronically Signed   By: Ronney Asters M.D.   On: 11/02/2021 21:54    Procedures Procedures    Medications Ordered in ED Medications  fentaNYL (SUBLIMAZE) injection 50 mcg (50 mcg Intravenous Given 11/02/21 2115)  fentaNYL (SUBLIMAZE) injection 50 mcg (50 mcg Intravenous Given 11/02/21 2309)    ED Course/ Medical Decision Making/ A&P                           Medical Decision Making Amount and/or Complexity of Data Reviewed Labs: ordered. Radiology: ordered.  Risk Prescription drug management.   88 year old female with a history of CKD stage III, diabetes type 2, hypertension, hyperlipidemia, diverticulitis status post ileostomy, hysterectomy/bladder suspension, history of bladder/breast cancer with bilateral mastectomy, history of chronic low back pain with multilevel degenerative disc disease for which she sees Dr. Nelva Bush, who presents with concern for mechanical fall on her left shoulder with left shoulder pain.  X-rays show significant displacement of a left humeral  neck fracture.  It is a closed fracture and she is neurovascularly intact.  She denies other injuries, notes that her back pain is not  changed and that she did not fall into this area.  She denies any other acute illness.  Discussed her humeral neck fracture with Dr. Rolena Infante orthopedics, who recommends sling and CT shoulder.  Ordered CT shoulder, as well as CT cervical spine given distracting injury, and CT head given age with fall.  She typically lives in the guest house alone but next to her son's home, does her own ostomy care and walks with a walker. She can no longer do those things with her current fracture.   Labs and CT have been ordered and are pending at time of transfer of care.  Plan to admit to the hospitalist service for continued pain control, OT/PT, likely rehab placement and orthopedics to evaluate in the morning.         Final Clinical Impression(s) / ED Diagnoses Final diagnoses:  Fall, initial encounter  Closed 2-part displaced fracture of surgical neck of left humerus, initial encounter    Rx / DC Orders ED Discharge Orders     None         Gareth Morgan, MD 11/02/21 2323

## 2021-11-02 NOTE — ED Triage Notes (Signed)
Pt BIB [facility] from home. Pt presents with a L shoulder injury from a fall. Denies head injury or LOC. Pt is not on anticoagulants. EMS has L arm in sling. Pt is A/Ox4 on arrival.

## 2021-11-02 NOTE — Progress Notes (Signed)
Orthopedic Tech Progress Note Patient Details:  Cheryl Salas 07/03/1923 [id number]  Ortho Devices Type of Ortho Device: Sling immobilizer Ortho Device/Splint Location: lue Ortho Device/Splint Interventions: Ordered, Application, Adjustment   Post Interventions Patient Tolerated: Well Instructions Provided: Care of device, Adjustment of device  [name] 11/02/2021, 10:25 PM

## 2021-11-02 NOTE — ED Notes (Signed)
Ortho called 

## 2021-11-03 ENCOUNTER — Encounter (HOSPITAL_COMMUNITY): Payer: Self-pay | Admitting: Internal Medicine

## 2021-11-03 DIAGNOSIS — E785 Hyperlipidemia, unspecified: Secondary | ICD-10-CM | POA: Diagnosis not present

## 2021-11-03 DIAGNOSIS — R000 Tachycardia, unspecified: Secondary | ICD-10-CM | POA: Diagnosis not present

## 2021-11-03 DIAGNOSIS — Z811 Family history of alcohol abuse and dependence: Secondary | ICD-10-CM | POA: Diagnosis not present

## 2021-11-03 DIAGNOSIS — E1165 Type 2 diabetes mellitus with hyperglycemia: Secondary | ICD-10-CM | POA: Diagnosis present

## 2021-11-03 DIAGNOSIS — Z6372 Alcoholism and drug addiction in family: Secondary | ICD-10-CM | POA: Diagnosis not present

## 2021-11-03 DIAGNOSIS — N1832 Chronic kidney disease, stage 3b: Secondary | ICD-10-CM | POA: Diagnosis present

## 2021-11-03 DIAGNOSIS — Y92019 Unspecified place in single-family (private) house as the place of occurrence of the external cause: Secondary | ICD-10-CM | POA: Diagnosis not present

## 2021-11-03 DIAGNOSIS — S42232A 3-part fracture of surgical neck of left humerus, initial encounter for closed fracture: Secondary | ICD-10-CM | POA: Diagnosis present

## 2021-11-03 DIAGNOSIS — S32591A Other specified fracture of right pubis, initial encounter for closed fracture: Secondary | ICD-10-CM | POA: Diagnosis present

## 2021-11-03 DIAGNOSIS — Z932 Ileostomy status: Secondary | ICD-10-CM | POA: Diagnosis not present

## 2021-11-03 DIAGNOSIS — W010XXA Fall on same level from slipping, tripping and stumbling without subsequent striking against object, initial encounter: Secondary | ICD-10-CM | POA: Diagnosis present

## 2021-11-03 DIAGNOSIS — E1142 Type 2 diabetes mellitus with diabetic polyneuropathy: Secondary | ICD-10-CM | POA: Diagnosis present

## 2021-11-03 DIAGNOSIS — M545 Low back pain, unspecified: Secondary | ICD-10-CM | POA: Diagnosis present

## 2021-11-03 DIAGNOSIS — S32592A Other specified fracture of left pubis, initial encounter for closed fracture: Secondary | ICD-10-CM | POA: Diagnosis present

## 2021-11-03 DIAGNOSIS — G8929 Other chronic pain: Secondary | ICD-10-CM | POA: Diagnosis present

## 2021-11-03 DIAGNOSIS — E119 Type 2 diabetes mellitus without complications: Secondary | ICD-10-CM | POA: Diagnosis not present

## 2021-11-03 DIAGNOSIS — S42302A Unspecified fracture of shaft of humerus, left arm, initial encounter for closed fracture: Secondary | ICD-10-CM | POA: Diagnosis present

## 2021-11-03 DIAGNOSIS — I440 Atrioventricular block, first degree: Secondary | ICD-10-CM | POA: Diagnosis not present

## 2021-11-03 DIAGNOSIS — Z853 Personal history of malignant neoplasm of breast: Secondary | ICD-10-CM | POA: Diagnosis not present

## 2021-11-03 DIAGNOSIS — S42202A Unspecified fracture of upper end of left humerus, initial encounter for closed fracture: Secondary | ICD-10-CM | POA: Diagnosis not present

## 2021-11-03 DIAGNOSIS — I10 Essential (primary) hypertension: Secondary | ICD-10-CM | POA: Diagnosis not present

## 2021-11-03 DIAGNOSIS — S42309A Unspecified fracture of shaft of humerus, unspecified arm, initial encounter for closed fracture: Secondary | ICD-10-CM | POA: Diagnosis present

## 2021-11-03 DIAGNOSIS — D62 Acute posthemorrhagic anemia: Secondary | ICD-10-CM | POA: Diagnosis not present

## 2021-11-03 DIAGNOSIS — E1122 Type 2 diabetes mellitus with diabetic chronic kidney disease: Secondary | ICD-10-CM | POA: Diagnosis present

## 2021-11-03 DIAGNOSIS — Z8551 Personal history of malignant neoplasm of bladder: Secondary | ICD-10-CM | POA: Diagnosis not present

## 2021-11-03 DIAGNOSIS — M109 Gout, unspecified: Secondary | ICD-10-CM | POA: Diagnosis present

## 2021-11-03 DIAGNOSIS — E538 Deficiency of other specified B group vitamins: Secondary | ICD-10-CM | POA: Diagnosis present

## 2021-11-03 DIAGNOSIS — S42212A Unspecified displaced fracture of surgical neck of left humerus, initial encounter for closed fracture: Secondary | ICD-10-CM | POA: Diagnosis not present

## 2021-11-03 DIAGNOSIS — Z9013 Acquired absence of bilateral breasts and nipples: Secondary | ICD-10-CM | POA: Diagnosis not present

## 2021-11-03 DIAGNOSIS — D509 Iron deficiency anemia, unspecified: Secondary | ICD-10-CM | POA: Diagnosis present

## 2021-11-03 DIAGNOSIS — M549 Dorsalgia, unspecified: Secondary | ICD-10-CM | POA: Diagnosis present

## 2021-11-03 DIAGNOSIS — Z7983 Long term (current) use of bisphosphonates: Secondary | ICD-10-CM | POA: Diagnosis not present

## 2021-11-03 DIAGNOSIS — E782 Mixed hyperlipidemia: Secondary | ICD-10-CM | POA: Diagnosis present

## 2021-11-03 DIAGNOSIS — G709 Myoneural disorder, unspecified: Secondary | ICD-10-CM | POA: Diagnosis not present

## 2021-11-03 LAB — BASIC METABOLIC PANEL
Anion gap: 6 (ref 5–15)
BUN: 31 mg/dL — ABNORMAL HIGH (ref 8–23)
CO2: 23 mmol/L (ref 22–32)
Calcium: 8.7 mg/dL — ABNORMAL LOW (ref 8.9–10.3)
Chloride: 108 mmol/L (ref 98–111)
Creatinine, Ser: 1.23 mg/dL — ABNORMAL HIGH (ref 0.44–1.00)
GFR, Estimated: 40 mL/min — ABNORMAL LOW (ref >60)
Glucose, Bld: 142 mg/dL — ABNORMAL HIGH (ref 70–99)
Potassium: 4 mmol/L (ref 3.5–5.1)
Sodium: 137 mmol/L (ref 135–145)

## 2021-11-03 LAB — CBG MONITORING, ED
Glucose-Capillary: 166 mg/dL — ABNORMAL HIGH (ref 70–99)
Glucose-Capillary: 131 mg/dL — ABNORMAL HIGH (ref 70–99)
Glucose-Capillary: 153 mg/dL — ABNORMAL HIGH (ref 70–99)

## 2021-11-03 LAB — CBC
HCT: 30.3 % — ABNORMAL LOW (ref 36.0–46.0)
Hemoglobin: 9.8 g/dL — ABNORMAL LOW (ref 12.0–15.0)
MCH: 31.5 pg (ref 26.0–34.0)
MCHC: 32.3 g/dL (ref 30.0–36.0)
MCV: 97.4 fL (ref 80.0–100.0)
Platelets: 161 10*3/uL (ref 150–400)
RBC: 3.11 MIL/uL — ABNORMAL LOW (ref 3.87–5.11)
RDW: 13.7 % (ref 11.5–15.5)
WBC: 7.3 10*3/uL (ref 4.0–10.5)
nRBC: 0 % (ref 0.0–0.2)

## 2021-11-03 LAB — HEMOGLOBIN A1C
Hgb A1c MFr Bld: 7.8 % — ABNORMAL HIGH (ref 4.8–5.6)
Mean Plasma Glucose: 177.16 mg/dL

## 2021-11-03 LAB — PHOSPHORUS: Phosphorus: 3.8 mg/dL (ref 2.5–4.6)

## 2021-11-03 LAB — GLUCOSE, CAPILLARY
Glucose-Capillary: 131 mg/dL — ABNORMAL HIGH (ref 70–99)
Glucose-Capillary: 242 mg/dL — ABNORMAL HIGH (ref 70–99)

## 2021-11-03 LAB — MAGNESIUM: Magnesium: 1.6 mg/dL — ABNORMAL LOW (ref 1.7–2.4)

## 2021-11-03 MED ORDER — ENOXAPARIN SODIUM 30 MG/0.3ML IJ SOSY
30.0000 mg | PREFILLED_SYRINGE | INTRAMUSCULAR | Status: DC
  Administered 2021-11-03 – 2021-11-10 (×7): 30 mg via SUBCUTANEOUS
  Filled 2021-11-03 (×7): qty 0.3

## 2021-11-03 MED ORDER — POLYETHYLENE GLYCOL 3350 17 G PO PACK: 17.0000 g | PACK | Freq: Every day | ORAL | Status: DC | PRN

## 2021-11-03 MED ORDER — INSULIN ASPART 100 UNIT/ML IJ SOLN
0.0000 [IU] | Freq: Three times a day (TID) | INTRAMUSCULAR | Status: DC
  Administered 2021-11-03 (×2): 1 [IU] via SUBCUTANEOUS
  Administered 2021-11-03 – 2021-11-04 (×2): 2 [IU] via SUBCUTANEOUS
  Administered 2021-11-04: 1 [IU] via SUBCUTANEOUS
  Administered 2021-11-05 (×2): 2 [IU] via SUBCUTANEOUS
  Administered 2021-11-05: 3 [IU] via SUBCUTANEOUS
  Administered 2021-11-06: 2 [IU] via SUBCUTANEOUS
  Administered 2021-11-07: 1 [IU] via SUBCUTANEOUS
  Administered 2021-11-07: 3 [IU] via SUBCUTANEOUS
  Administered 2021-11-07: 7 [IU] via SUBCUTANEOUS
  Administered 2021-11-08: 3 [IU] via SUBCUTANEOUS
  Administered 2021-11-08: 2 [IU] via SUBCUTANEOUS
  Administered 2021-11-08 – 2021-11-09 (×2): 1 [IU] via SUBCUTANEOUS
  Administered 2021-11-09: 2 [IU] via SUBCUTANEOUS
  Administered 2021-11-09 – 2021-11-10 (×3): 1 [IU] via SUBCUTANEOUS
  Filled 2021-11-03: qty 0.09

## 2021-11-03 MED ORDER — ACETAMINOPHEN 325 MG PO TABS: 650.0000 mg | ORAL_TABLET | Freq: Four times a day (QID) | ORAL | Status: DC | PRN

## 2021-11-03 MED ORDER — SENNOSIDES-DOCUSATE SODIUM 8.6-50 MG PO TABS
1.0000 | ORAL_TABLET | Freq: Every day | ORAL | Status: DC
  Administered 2021-11-03 – 2021-11-08 (×7): 1 via ORAL
  Filled 2021-11-03 (×7): qty 1

## 2021-11-03 MED ORDER — INSULIN ASPART 100 UNIT/ML IJ SOLN
0.0000 [IU] | Freq: Every day | INTRAMUSCULAR | Status: DC
  Administered 2021-11-03: 2 [IU] via SUBCUTANEOUS
  Administered 2021-11-04: 3 [IU] via SUBCUTANEOUS
  Administered 2021-11-05: 4 [IU] via SUBCUTANEOUS
  Administered 2021-11-07: 2 [IU] via SUBCUTANEOUS
  Filled 2021-11-03: qty 0.05

## 2021-11-03 MED ORDER — MELATONIN 5 MG PO TABS: 5.0000 mg | ORAL_TABLET | Freq: Every evening | ORAL | Status: DC | PRN

## 2021-11-03 MED ORDER — PROCHLORPERAZINE EDISYLATE 10 MG/2ML IJ SOLN: 10.0000 mg | Freq: Four times a day (QID) | INTRAMUSCULAR | Status: DC | PRN

## 2021-11-03 MED ORDER — ROSUVASTATIN CALCIUM 10 MG PO TABS
10.0000 mg | ORAL_TABLET | Freq: Every day | ORAL | Status: DC
  Administered 2021-11-03 – 2021-11-09 (×6): 10 mg via ORAL
  Filled 2021-11-03 (×6): qty 1

## 2021-11-03 MED ORDER — MAGNESIUM SULFATE 2 GM/50ML IV SOLN
2.0000 g | Freq: Once | INTRAVENOUS | Status: AC
  Administered 2021-11-03: 2 g via INTRAVENOUS
  Filled 2021-11-03: qty 50

## 2021-11-03 MED ORDER — ACETAMINOPHEN 500 MG PO TABS
1000.0000 mg | ORAL_TABLET | Freq: Three times a day (TID) | ORAL | Status: DC
  Administered 2021-11-03 – 2021-11-10 (×17): 1000 mg via ORAL
  Filled 2021-11-03 (×17): qty 2

## 2021-11-03 MED ORDER — HYDRALAZINE HCL 25 MG PO TABS
25.0000 mg | ORAL_TABLET | Freq: Three times a day (TID) | ORAL | Status: DC
  Administered 2021-11-03 (×2): 25 mg via ORAL
  Filled 2021-11-03 (×4): qty 1

## 2021-11-03 MED ORDER — OXYCODONE HCL 5 MG PO TABS: 5.0000 mg | ORAL_TABLET | Freq: Four times a day (QID) | ORAL | Status: DC | PRN

## 2021-11-03 MED ORDER — SODIUM CHLORIDE 0.9 % IV SOLN: INTRAVENOUS | Status: DC

## 2021-11-03 MED ORDER — HYDROMORPHONE HCL 2 MG/ML IJ SOLN
0.5000 mg | INTRAMUSCULAR | Status: AC
  Administered 2021-11-03: 0.5 mg via INTRAVENOUS
  Filled 2021-11-03: qty 1

## 2021-11-03 MED ORDER — HYDROMORPHONE HCL 2 MG/ML IJ SOLN
0.5000 mg | INTRAMUSCULAR | Status: DC | PRN
  Administered 2021-11-03: 0.5 mg via INTRAVENOUS
  Filled 2021-11-03: qty 1

## 2021-11-03 NOTE — H&P (Signed)
History and Physical  Nicolette Gieske BMW:[id number] DOB: Oct 10, 1923 DOA: 11/02/2021  Referring physician: Dr. Florina Ou, [place]  PCP: Gayland Curry, DO  Outpatient Specialists: None Patient coming from: Home  Chief Complaint: Fall and left shoulder injury  HPI: Cheryl Salas is a 88 y.o. female with medical history significant for essential hypertension, type 2 diabetes, hyperlipidemia, CKD 3B, prior diverticulitis status post ileostomy, hysterectomy/bladder suspension, history of bladder/breast cancer with bilateral mastectomy, ambulatory dysfunction with use of a walker, who presented to [facility] ED from home with complaints of left shoulder pain after a mechanical fall.  She tripped while using her walker.  She was in her usual state of health prior to this.  EMS was activated.    She was brought into the ED for further evaluation.  X-ray done in the ED showed left acute displaced left humeral neck fracture.  EDP discussed the case with orthopedic surgery who recommended conservative management with a sling.  Unable to discharge home due to uncontrolled pain.  EDP requested admission for pain control and placement.  TRH, hospitalist service, was asked to admit.  ED Course: Tmax 98.8.  BP 144/76, pulse 89, respiratory 12, O2 saturation 98% on room air.  Lab studies remarkable for serum bicarb 19, serum glucose 249, BUN 28, creatinine 1.41, WBC 6.5, hemoglobin 11.0, MCV 98, platelet 170.  Review of Systems: Review of systems as noted in the HPI. All other systems reviewed and are negative.   Past Medical History:  Diagnosis Date   Allergic rhinitis    Per Records from Dr.Altheimer    Anemia    Per Records from Dr.Altheimer    Arthralgia of multiple sites    Per Records from [place]    B12 deficiency    Per Records from [place]    Baker's cyst    Per Records from [place]    Bladder cancer [facility])    CKD (chronic kidney disease) stage 3, GFR 30-59 ml/min ([place])    Per Records from  [place]    Dehydration    Per Records from [place]    Dextroscoliosis    Per Records from [place]    Diabetes [facility])    Diverticulitis    Per Records from [place]    Diverticulosis    Fecal occult blood test positive    Per Records from [place]    Gout    left foot   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[place])    Per Records from [place]    Hypertension    Per Records from Dr.Altheimer    Hypocalcemia    Per Records from Dr.Altheimer    Hypomagnesemia    Per Records from Dr.Altheimer    Iron deficiency    Per Records from [place]    Large bowel obstruction ([place])    Per Records from [place]    Mixed dyslipidemia    Per Records from Dr.Altheimer    Myopathy    Per Records from Dr.Altheimer    Onychomycosis    Per Records from Dr.Altheimer    Peripheral neuropathy    Per Records from [place]    Recurrent UTI    Vitamin D deficiency    Per Records from [place]    Past Surgical History:  Procedure Laterality Date   BLADDER SUSPENSION     Per Records from [place], [place]   hysterectomy  1966   ILEOSTOMY  2017   KYPHOPLASTY  2019   MASTECTOMY Right  1998   MASTECTOMY Left 2011   TRANSURETHRAL RESECTION OF BLADDER TUMOR      Social History:  reports that she has never smoked. She has never used smokeless tobacco. She reports that she does not drink alcohol and does not use drugs.   Allergies  Allergen Reactions   Codeine Itching, Nausea Only and Swelling   Flexeril [Cyclobenzaprine]     Per Records from [place]     Family History  Problem Relation Age of Onset   Transient ischemic attack Mother 8   Heart attack Father 51   Pancreatic cancer Sister 97   Epilepsy Brother 60   Diabetes Brother 51   Dementia Sister 58   Diabetes Brother    Stroke Brother 67   Alcoholism Brother 71   Cancer - Other Son 55       liver cancer; [place]   Heart attack Son 80    Diabetes Son    Anuerysm Daughter 28       brain      Prior to Admission medications   Medication Sig Start Date End Date Taking? Authorizing Provider  alendronate (FOSAMAX) 70 MG tablet TAKE 1 TABLET BY MOUTH ONCE A WEEK WITH A FULL GLASS OF WATER ON AN EMPTY STOMACH 04/15/20  Yes Reed, Tiffany L, DO  ASPIRIN LOW DOSE 81 MG EC tablet Take 81 mg by mouth daily. 06/09/21  Yes [provider]  Cholecalciferol (D3 HIGH POTENCY) 50 MCG (2000 UT) CAPS Take 1 capsule (2,000 Units total) by mouth daily. Needs an appointment before anymore future refills. 07/23/20  Yes Wardell Honour, MD  diphenoxylate-atropine (LOMOTIL) 2.5-0.025 MG tablet TAKE 1 TABLET BY MOUTH EVERY MORNING, 1 TABLET AT LUNCH, AND 1 TABLET AT dinner Patient taking differently: Take 1 tablet by mouth in the morning, at noon, and at bedtime. 03/26/20  Yes Reed, Tiffany L, DO  empagliflozin (JARDIANCE) 25 MG TABS tablet Take 1 tablet (25 mg total) by mouth daily before breakfast. 07/09/21  Yes Reed, Tiffany L, DO  hydrALAZINE (APRESOLINE) 25 MG tablet Take 25 mg by mouth 3 (three) times daily. 06/09/21  Yes [provider]  iron polysaccharides (FERREX 150) 150 MG capsule Take one capsule by mouth once daily. Needs an appointment before anymore future refills. Patient taking differently: Take 150 mg by mouth daily. Take one capsule by mouth once daily. Needs an appointment before anymore future refills. 07/23/20  Yes Wardell Honour, MD  loperamide (IMODIUM) 2 MG capsule TAKE 1 CAPSULE BY MOUTH EVERY MORNING, 1 CAPSULE AT LUNCH, AND 1 CAPSULE AT dinner Patient taking differently: Take 2 mg by mouth in the morning, at noon, and at bedtime. 03/26/20  Yes Reed, Tiffany L, DO  rosuvastatin (CRESTOR) 10 MG tablet Take 10 mg by mouth daily. 06/09/21  Yes [provider]  sodium bicarbonate 650 MG tablet Take 1 tablet (650 mg total) by mouth 2 (two) times daily. Needs an appointment before anymore future refills. 07/23/20  Yes  Wardell Honour, MD  traMADol (ULTRAM) 50 MG tablet Take 1 tablet (50 mg total) by mouth daily as needed (pain). 07/09/21  Yes Reed, Tiffany L, DO  vitamin C (ASCORBIC ACID) 500 MG tablet Take 1 tablet (500 mg total) by mouth daily. Needs an appointment before anymore future refills. 07/23/20  Yes Wardell Honour, MD  zolpidem (AMBIEN) 5 MG tablet TAKE 1 TABLET BY MOUTH AT BEDTIME AS NEEDED FOR SLEEP Patient taking differently: Take 5 mg by mouth at bedtime  as needed for sleep. 03/26/20  Yes Reed, Tiffany L, DO  carbamide peroxide (DEBROX) 6.5 % OTIC solution Place 5 drops into both ears at bedtime as needed (x 1 week). Patient not taking: Reported on 11/02/2021 11/23/19   Mariea Clonts, Tiffany L, DO  ondansetron (ZOFRAN-ODT) 4 MG disintegrating tablet Take 1 tablet (4 mg total) by mouth every 8 (eight) hours as needed for nausea or vomiting. Patient not taking: Reported on 11/02/2021 06/05/19   Hollace Kinnier L, DO  psyllium (METAMUCIL) 58.6 % packet Take 1 packet by mouth at bedtime. Patient not taking: Reported on 11/02/2021 02/09/18   Gayland Curry, DO    Physical Exam: BP 131/89   Pulse 82   Temp 98.8 F (37.1 C) (Oral)   Resp 14   Ht '4\' 11"'$  (1.499 m)   Wt 58.3 kg   SpO2 94%   BMI 25.96 kg/m   General: 88 y.o. year-old female frail-appearing in no acute distress.  Alert and oriented x3. Cardiovascular: Regular rate and rhythm with no rubs or gallops.  No thyromegaly or JVD noted.  No lower extremity edema. 2/4 pulses in all 4 extremities. Respiratory: Clear to auscultation with no wheezes or rales. Good inspiratory effort. Abdomen: Soft nontender nondistended with normal bowel sounds x4 quadrants. Muskuloskeletal: No cyanosis, clubbing or edema noted bilaterally.  Left shoulder in a sling. Neuro: CN II-XII intact, strength, sensation, reflexes Skin: No ulcerative lesions noted or rashes Psychiatry: Judgement and insight appear normal. Mood is appropriate for condition and setting           Labs on Admission:  Basic Metabolic Panel: Recent Labs  Lab 11/02/21 2055  NA 137  K 4.1  CL 107  CO2 19*  GLUCOSE 249*  BUN 28*  CREATININE 1.41*  CALCIUM 9.0   Liver Function Tests: Recent Labs  Lab 11/02/21 2055  AST 22  ALT 22  ALKPHOS 36*  BILITOT 0.7  PROT 6.3*  ALBUMIN 3.7   No results for input(s): "LIPASE", "AMYLASE" in the last 168 hours. No results for input(s): "AMMONIA" in the last 168 hours. CBC: Recent Labs  Lab 11/02/21 2055  WBC 6.5  NEUTROABS 3.6  HGB 11.0*  HCT 34.3*  MCV 98.0  PLT 170   Cardiac Enzymes: No results for input(s): "CKTOTAL", "CKMB", "CKMBINDEX", "TROPONINI" in the last 168 hours.  BNP (last 3 results) No results for input(s): "BNP" in the last 8760 hours.  ProBNP (last 3 results) No results for input(s): "PROBNP" in the last 8760 hours.  CBG: No results for input(s): "GLUCAP" in the last 168 hours.  Radiological Exams on Admission: CT Shoulder Left Wo Contrast  Result Date: 11/03/2021 CLINICAL DATA:  Fall with proximal left humeral fracture. EXAM: CT OF THE UPPER LEFT EXTREMITY WITHOUT CONTRAST TECHNIQUE: Multidetector CT imaging of the upper left extremity was performed according to the standard protocol. RADIATION DOSE REDUCTION: This exam was performed according to the departmental dose-optimization program which includes automated exposure control, adjustment of the mA and/or kV according to patient size and/or use of iterative reconstruction technique. COMPARISON:  Right shoulder series earlier today. FINDINGS: Bones/Joint/Cartilage Generalized osteopenia. Again noted is an acute transverse surgical neck fracture of the proximal left humerus. The distal fragment is impacted and cephalad displaced up to 2 cm with respect to the proximal fragment, displaced anteriorly up to an entire shaft width, displaced laterally by about 1/2 of a shaft's width, with mild posteromedial angulation. There are small comminution fragments  along side the fracture margins.  The left clavicle, sternoclavicular and AC joints, and visualized portion of the scapula are intact. The visualized left ribs do not show evidence of a displaced fracture. There is advanced degenerative change of the visualized cervical spine. Evaluation of the glenohumeral joint demonstrates labral chondrocalcinosis and mild periarticular spurring changes, but only slight joint space loss. There are periarticular spurs and calcified pannus involving the [facility] joint and sternoclavicular joint. There is a small glenohumeral joint effusion with scattered calcific debris in the joint space. Ligaments Suboptimally assessed by CT. Muscles and Tendons There are calcifications within the rotator cuff tendons, relatively mild narrowing is seen along the acromial humeral space and only mild atrophy in the supraspinatus muscle belly with other rotator cuff muscles showing normal bulk. No acute tendon rupture is seen. The proximal end of the distal fracture fragment protrudes up into the anterior aspect of the deltoid muscle with swelling and soft tissue fullness in the anterior left deltoid. There is no other focal muscular acute abnormality. Soft tissues There is moderate subcutaneous stranding in the anterior left shoulder, likely ecchymosis. No space-occupying hematoma is seen. There is atherosclerotic disease in the aortic arch. IMPRESSION: 1. Acute surgical neck proximal left humeral fracture with mild posteromedial angulation, anterior and lateral displacement of the distal fragment, and cephalad displacement with the proximal margin of the distal fragment having extended into the anterior deltoid muscle with swelling in the muscle and overlying ecchymosis. 2. Osteopenia, degenerative changes, and chondrocalcinosis, the latter consistent with CPPD. 3. Scattered calcific debris in the glenohumeral joint space, calcified pannus in the [facility] joint and sternoclavicular joint. 4. Rotator cuff  tendon calcifications, could be related CPPD and/or calcific tendinopathy. 5. Aortic atherosclerosis. Electronically Signed   By: Telford Nab M.D.   On: 11/03/2021 00:25   CT Cervical Spine Wo Contrast  Result Date: 11/02/2021 CLINICAL DATA:  Status post fall. EXAM: CT CERVICAL SPINE WITHOUT CONTRAST TECHNIQUE: Multidetector CT imaging of the cervical spine was performed without intravenous contrast. Multiplanar CT image reconstructions were also generated. RADIATION DOSE REDUCTION: This exam was performed according to the departmental dose-optimization program which includes automated exposure control, adjustment of the mA and/or kV according to patient size and/or use of iterative reconstruction technique. COMPARISON:  None Available. FINDINGS: Alignment: There is approximately 3 mm anterolisthesis of the C3 vertebral body on C4. Reversal of the normal cervical spine lordosis is also noted. Skull base and vertebrae: No acute fracture. Chronic and degenerative changes are seen involving the body and tip of the dens, as well as the adjacent portion of the anterior arch of C1. Soft tissues and spinal canal: No prevertebral fluid or swelling. No visible canal hematoma. Disc levels: Marked severity endplate sclerosis is seen at the levels of C5-C6 and C6-C7, with moderate severity endplate sclerosis noted at C3-C4 and C4-C5. There is marked severity narrowing of the anterior atlantoaxial articulation. Marked severity intervertebral disc space narrowing is seen at C3-C4, C4-C5, C5-C6 and C6-C7. Marked severity bilateral facet joint hypertrophy is seen at the level of C3-C4, with marked severity left-sided facet joint hypertrophy noted at C2-C3. Moderate severity bilateral facet joint hypertrophy is noted throughout the remainder of the cervical spine. Upper chest: Negative. Other: None. IMPRESSION: 1. No acute fracture or traumatic malalignment of the cervical spine. 2. Marked severity multilevel degenerative  changes, as described above. 3. 3 mm anterolisthesis of the C3 vertebral body on C4. 4. Reversal of the normal cervical spine lordosis, which may be secondary to positioning or muscle spasm. Electronically  Signed   By: Virgina Norfolk M.D.   On: 11/02/2021 23:59   CT Head Wo Contrast  Result Date: 11/02/2021 CLINICAL DATA:  Status post fall. EXAM: CT HEAD WITHOUT CONTRAST TECHNIQUE: Contiguous axial images were obtained from the base of the skull through the vertex without intravenous contrast. RADIATION DOSE REDUCTION: This exam was performed according to the departmental dose-optimization program which includes automated exposure control, adjustment of the mA and/or kV according to patient size and/or use of iterative reconstruction technique. COMPARISON:  None Available. FINDINGS: Brain: There is moderate severity cerebral atrophy with widening of the extra-axial spaces and ventricular dilatation. There are areas of decreased attenuation within the white matter tracts of the supratentorial brain, consistent with microvascular disease changes. Vascular: No hyperdense vessel or unexpected calcification. Skull: Normal. Negative for fracture or focal lesion. Sinuses/Orbits: No acute finding. Other: None. IMPRESSION: 1. No acute intracranial abnormality. 2. Generalized cerebral atrophy and microvascular disease changes of the supratentorial brain. Electronically Signed   By: Virgina Norfolk M.D.   On: 11/02/2021 23:56   DG Shoulder Left  Result Date: 11/02/2021 CLINICAL DATA:  Fall. EXAM: LEFT SHOULDER - 2+ VIEW COMPARISON:  None Available. FINDINGS: There is an acute mildly comminuted fracture through the left humeral neck. There is 1 shaft with lateral displacement of the distal fracture fragment. There is apex lateral angulation. No dislocation. There are mild degenerative changes of the acromioclavicular joint. There are multiple healed left-sided rib fractures. IMPRESSION: 1. Acute displaced left  humeral neck fracture. Electronically Signed   By: Ronney Asters M.D.   On: 11/02/2021 21:54    EKG: I independently viewed the EKG done and my findings are as followed: Sinus arrhythmia rate of 71, nonspecific ST-T changes.  QTc 456.  Assessment/Plan Present on Admission:  Humeral fracture  Principal Problem:   Humeral fracture  Left humeral neck fracture post mechanical fall, POA  X-ray done in the ED showed left acute displaced left humeral neck fracture.  EDP discussed the case with orthopedic surgery who recommended conservative management with a sling.   Unable to discharge the patient back to her home due to uncontrolled pain requiring IV narcotics.   EDP requested admission for pain control and placement.  Analgesics as needed and bowel regimen Gentle IV fluid hydration normal saline at 50 cc/h.  Hypertension, BP is not at goal, elevated. Resume home oral antihypertensives Monitor vital signs.  Hyperlipidemia Resume home regimen  Type 2 diabetes with hyperglycemia Presenting serum glucose 249. Obtain hemoglobin A1c Start insulin sliding scale.  Ambulatory dysfunction post mechanical fall Uses a walker to ambulate at baseline PT OT to assess Fall precautions TOC consulted to assist with DC planning.   DVT prophylaxis: Subcu Lovenox daily  Code Status: Full code  Family Communication: None at bedside  Disposition Plan: Admitted to telemetry unit  Consults called: EDP discussed the case with orthopedic surgery.   Admission status: Inpatient status.   Status is: Inpatient The patient requires at least 2 midnights for further evaluation and treatment of present condition.   Kayleen Memos MD [facility] Pager [phone]  If 7PM-7AM, please contact night-coverage [email or web address] Password [email or web address]  11/03/2021, 1:04 AM

## 2021-11-03 NOTE — Plan of Care (Signed)
  Problem: Education: Goal: Ability to describe self-care measures that may prevent or decrease complications (Diabetes Survival Skills Education) will improve Outcome: Progressing   Problem: Coping: Goal: Ability to adjust to condition or change in health will improve Outcome: Progressing   Problem: Health Behavior/Discharge Planning: Goal: Ability to identify and utilize available resources and services will improve Outcome: Progressing   Problem: Nutritional: Goal: Maintenance of adequate nutrition will improve Outcome: Progressing   Problem: Skin Integrity: Goal: Risk for impaired skin integrity will decrease Outcome: Progressing   Problem: Tissue Perfusion: Goal: Adequacy of tissue perfusion will improve Outcome: Progressing

## 2021-11-03 NOTE — Evaluation (Signed)
Occupational Therapy Evaluation Patient Details Name: Cheryl Salas MRN: [id number] DOB: 09/19/1923 Today's Date: 11/03/2021   History of Present Illness Cheryl Salas is a 88 y.o. female with medical history significant for essential hypertension, type 2 diabetes, hyperlipidemia, CKD 3B, prior diverticulitis status post ileostomy, hysterectomy/bladder suspension, history of bladder/breast cancer with bilateral mastectomy, ambulatory dysfunction with use of a walker, who presented to [facility] ED from home with complaints of left shoulder pain after a mechanical fall on 11/02/21 and sustained left displaced  humeral neck fracture, conservative management with sling   Clinical Impression   Patient is a 88 year old female who was admitted for above. Patient was noted to have increased pain and require increased assistance for all ADL tasks with L sling in place. Patient was noted to have HR reach 120 sitting EOB initally with HR reaching 140 max standing on stool at side of stretcher due to patients short stature. Patient lives at home alone at this time. Patient was noted to have decreased functional activity tolerance,decreased knowledge of NWB/ROM restrictions,increased pain, decreased endurance, decreased standing balance, decreased safety awareness, and decreased knowledge of AD/AE impacting participation in ADLs. Patient would continue to benefit from skilled OT services at this time while admitted and after d/c to address noted deficits in order to improve overall safety and independence in ADLs.         Recommendations for follow up therapy are one component of a multi-disciplinary discharge planning process, led by the attending physician.  Recommendations may be updated based on patient status, additional functional criteria and insurance authorization.   Follow Up Recommendations  Skilled nursing-short term rehab (<3 hours/day)    Assistance Recommended at Discharge Frequent or constant  Supervision/Assistance  Patient can return home with the following Two people to help with walking and/or transfers;Two people to help with bathing/dressing/bathroom;Direct supervision/assist for financial management;Help with stairs or ramp for entrance;Assist for transportation;Direct supervision/assist for medications management;Assistance with cooking/housework    Functional Status Assessment  Patient has had a recent decline in their functional status and demonstrates the ability to make significant improvements in function in a reasonable and predictable amount of time.  Equipment Recommendations  Other (comment) (defer to next venue)    Recommendations for Other Services       Precautions / Restrictions Precautions Precautions: Fall Precaution Comments: R arm sling, ostomy Required Braces or Orthoses: Sling Restrictions Weight Bearing Restrictions: Yes RUE Weight Bearing: Non weight bearing      Mobility Bed Mobility Overal bed mobility: Needs Assistance Bed Mobility: Rolling, Sidelying to Sit, Sit to Supine Rolling: Min assist Sidelying to sit: Mod assist   Sit to supine: Mod assist   General bed mobility comments: assist  with trunk to upright,Patient initiated placing legs on to bed and able to return to supine once legs on bed    Transfers Overall transfer level: Needs assistance   Transfers: Sit to/from Stand             General transfer comment: Step stool with Right side rail used to stand from stretcher due to patient short stature. Patient stood briefly with max support. Unabl eot step due to being on stool.      Balance Overall balance assessment: Needs assistance, History of Falls Sitting-balance support: Single extremity supported, Feet unsupported Sitting balance-Leahy Scale: Poor Sitting balance - Comments: tends to lean posteriorly Postural control: Posterior lean Standing balance support: Single extremity supported, During functional  activity, Reliant on assistive device for balance Standing balance-Leahy  Scale: Poor Standing balance comment: max support for balance on step stool                           ADL either performed or assessed with clinical judgement   ADL Overall ADL's : Needs assistance/impaired Eating/Feeding: Minimal assistance;Sitting   Grooming: Wash/dry face;Wash/dry hands;Bed level;Set up   Upper Body Bathing: Maximal assistance;Sitting   Lower Body Bathing: Maximal assistance;Sitting/lateral leans   Upper Body Dressing : Maximal assistance;Sitting Upper Body Dressing Details (indicate cue type and reason): to adjust sling and down gown with increased time and education on relaxing L shoulder into sling v.s. guarding positioning. Lower Body Dressing: Maximal assistance;Sitting/lateral leans   Toilet Transfer: +2 for physical assistance;+2 for safety/equipment Toilet Transfer Details (indicate cue type and reason): patient attempted standing at EOB with HR reaching 140 bpm patient returned to supine. Toileting- Clothing Manipulation and Hygiene: Total assistance;Bed level               Vision Patient Visual Report: No change from baseline       Perception     Praxis      Pertinent Vitals/Pain Pain Assessment Pain Assessment: 0-10 Pain Score: 10-Worst pain ever Pain Location: LUE and back Pain Descriptors / Indicators: Discomfort, Guarding Pain Intervention(s): Premedicated before session, Monitored during session, Repositioned, Limited activity within patient's tolerance     Hand Dominance Right   Extremity/Trunk Assessment Upper Extremity Assessment Upper Extremity Assessment: LUE deficits/detail LUE Deficits / Details: in sling with education on proper positioning and not attempting to use LUE to adjust sling. current NO ROM and NO WB orders at this time   Lower Extremity Assessment Lower Extremity Assessment: Defer to PT evaluation   Cervical / Trunk  Assessment Cervical / Trunk Assessment: Kyphotic   Communication Communication Communication: No difficulties   Cognition Arousal/Alertness: Awake/alert Behavior During Therapy: WFL for tasks assessed/performed Overall Cognitive Status: Within Functional Limits for tasks assessed                                 General Comments: son was also present in room     General Comments       Exercises     Shoulder Instructions      Home Living Family/patient expects to be discharged to:: Skilled nursing facility                                 Additional Comments: lives alone      Prior Functioning/Environment Prior Level of Function : Independent/Modified Independent             Mobility Comments: uses a RW          OT Problem List: Decreased strength;Impaired balance (sitting and/or standing);Pain;Impaired UE functional use;Decreased activity tolerance;Decreased safety awareness;Cardiopulmonary status limiting activity      OT Treatment/Interventions: Self-care/ADL training;DME and/or AE instruction;Therapeutic activities;Balance training;Therapeutic exercise;Neuromuscular education;Energy conservation;Patient/family education    OT Goals(Current goals can be found in the care plan section) Acute Rehab OT Goals Patient Stated Goal: to get back home OT Goal Formulation: With patient/family Time For Goal Achievement: 11/17/21 Potential to Achieve Goals: Fair  OT Frequency: Min 2X/week    Co-evaluation PT/OT/SLP Co-Evaluation/Treatment: Yes Reason for Co-Treatment: For patient/therapist safety;To address functional/ADL transfers PT goals addressed during session: Mobility/safety with mobility OT goals addressed  during session: ADL's and self-care      AM-PAC OT "6 Clicks" Daily Activity     Outcome Measure Help from another person eating meals?: A Little Help from another person taking care of personal grooming?: A Little Help from  another person toileting, which includes using toliet, bedpan, or urinal?: Total Help from another person bathing (including washing, rinsing, drying)?: A Lot Help from another person to put on and taking off regular upper body clothing?: A Lot Help from another person to put on and taking off regular lower body clothing?: A Lot 6 Click Score: 13   End of Session Equipment Utilized During Treatment: Gait belt;Other (comment) (sling)  Activity Tolerance: Patient limited by pain;Other (comment) (increased HR) Patient left: in bed;with family/visitor present;with call bell/phone within reach  OT Visit Diagnosis: Unsteadiness on feet (R26.81);Other abnormalities of gait and mobility (R26.89);Pain Pain - Right/Left: Left Pain - part of body: Shoulder                Time: 8786-7672 OT Time Calculation (min): 24 min Charges:  OT General Charges $OT Visit: 1 Visit OT Evaluation $OT Eval Moderate Complexity: 1 Mod  Mckell Riecke OTR/L, MS Acute Rehabilitation Department Office# [phone]   Marcellina Millin 11/03/2021, 9:53 AM

## 2021-11-03 NOTE — Plan of Care (Signed)

## 2021-11-03 NOTE — ED Provider Notes (Signed)
Nursing notes and vitals signs, including pulse oximetry, reviewed.  Summary of this visit's results, reviewed by myself:  EKG:  EKG Interpretation  Date/Time:    Ventricular Rate:    PR Interval:    QRS Duration:   QT Interval:    QTC Calculation:   R Axis:     Text Interpretation:          Labs:  Results for orders placed or performed during the hospital encounter of 11/02/21 (from the past 24 hour(s))  CBC with Differential     Status: Abnormal   Collection Time: 11/02/21  8:55 PM  Result Value Ref Range   WBC 6.5 4.0 - 10.5 K/uL   RBC 3.50 (L) 3.87 - 5.11 MIL/uL   Hemoglobin 11.0 (L) 12.0 - 15.0 g/dL   HCT 34.3 (L) 36.0 - 46.0 %   MCV 98.0 80.0 - 100.0 fL   MCH 31.4 26.0 - 34.0 pg   MCHC 32.1 30.0 - 36.0 g/dL   RDW 13.8 11.5 - 15.5 %   Platelets 170 150 - 400 K/uL   nRBC 0.0 0.0 - 0.2 %   Neutrophils Relative % 54 %   Neutro Abs 3.6 1.7 - 7.7 K/uL   Lymphocytes Relative 38 %   Lymphs Abs 2.5 0.7 - 4.0 K/uL   Monocytes Relative 6 %   Monocytes Absolute 0.4 0.1 - 1.0 K/uL   Eosinophils Relative 1 %   Eosinophils Absolute 0.0 0.0 - 0.5 K/uL   Basophils Relative 1 %   Basophils Absolute 0.0 0.0 - 0.1 K/uL   Immature Granulocytes 0 %   Abs Immature Granulocytes 0.02 0.00 - 0.07 K/uL  Comprehensive metabolic panel     Status: Abnormal   Collection Time: 11/02/21  8:55 PM  Result Value Ref Range   Sodium 137 135 - 145 mmol/L   Potassium 4.1 3.5 - 5.1 mmol/L   Chloride 107 98 - 111 mmol/L   CO2 19 (L) 22 - 32 mmol/L   Glucose, Bld 249 (H) 70 - 99 mg/dL   BUN 28 (H) 8 - 23 mg/dL   Creatinine, Ser 1.41 (H) 0.44 - 1.00 mg/dL   Calcium 9.0 8.9 - 10.3 mg/dL   Total Protein 6.3 (L) 6.5 - 8.1 g/dL   Albumin 3.7 3.5 - 5.0 g/dL   AST 22 15 - 41 U/L   ALT 22 0 - 44 U/L   Alkaline Phosphatase 36 (L) 38 - 126 U/L   Total Bilirubin 0.7 0.3 - 1.2 mg/dL   GFR, Estimated 34 (L) >60 mL/min   Anion gap 11 5 - 15    Imaging Studies: CT Shoulder Left Wo Contrast  Result  Date: 11/03/2021 CLINICAL DATA:  Fall with proximal left humeral fracture. EXAM: CT OF THE UPPER LEFT EXTREMITY WITHOUT CONTRAST TECHNIQUE: Multidetector CT imaging of the upper left extremity was performed according to the standard protocol. RADIATION DOSE REDUCTION: This exam was performed according to the departmental dose-optimization program which includes automated exposure control, adjustment of the mA and/or kV according to patient size and/or use of iterative reconstruction technique. COMPARISON:  Right shoulder series earlier today. FINDINGS: Bones/Joint/Cartilage Generalized osteopenia. Again noted is an acute transverse surgical neck fracture of the proximal left humerus. The distal fragment is impacted and cephalad displaced up to 2 cm with respect to the proximal fragment, displaced anteriorly up to an entire shaft width, displaced laterally by about 1/2 of a shaft's width, with mild posteromedial angulation. There are small comminution fragments along  side the fracture margins. The left clavicle, sternoclavicular and AC joints, and visualized portion of the scapula are intact. The visualized left ribs do not show evidence of a displaced fracture. There is advanced degenerative change of the visualized cervical spine. Evaluation of the glenohumeral joint demonstrates labral chondrocalcinosis and mild periarticular spurring changes, but only slight joint space loss. There are periarticular spurs and calcified pannus involving the [facility] joint and sternoclavicular joint. There is a small glenohumeral joint effusion with scattered calcific debris in the joint space. Ligaments Suboptimally assessed by CT. Muscles and Tendons There are calcifications within the rotator cuff tendons, relatively mild narrowing is seen along the acromial humeral space and only mild atrophy in the supraspinatus muscle belly with other rotator cuff muscles showing normal bulk. No acute tendon rupture is seen. The proximal end of the  distal fracture fragment protrudes up into the anterior aspect of the deltoid muscle with swelling and soft tissue fullness in the anterior left deltoid. There is no other focal muscular acute abnormality. Soft tissues There is moderate subcutaneous stranding in the anterior left shoulder, likely ecchymosis. No space-occupying hematoma is seen. There is atherosclerotic disease in the aortic arch. IMPRESSION: 1. Acute surgical neck proximal left humeral fracture with mild posteromedial angulation, anterior and lateral displacement of the distal fragment, and cephalad displacement with the proximal margin of the distal fragment having extended into the anterior deltoid muscle with swelling in the muscle and overlying ecchymosis. 2. Osteopenia, degenerative changes, and chondrocalcinosis, the latter consistent with CPPD. 3. Scattered calcific debris in the glenohumeral joint space, calcified pannus in the [facility] joint and sternoclavicular joint. 4. Rotator cuff tendon calcifications, could be related CPPD and/or calcific tendinopathy. 5. Aortic atherosclerosis. Electronically Signed   By: Telford Nab M.D.   On: 11/03/2021 00:25   CT Cervical Spine Wo Contrast  Result Date: 11/02/2021 CLINICAL DATA:  Status post fall. EXAM: CT CERVICAL SPINE WITHOUT CONTRAST TECHNIQUE: Multidetector CT imaging of the cervical spine was performed without intravenous contrast. Multiplanar CT image reconstructions were also generated. RADIATION DOSE REDUCTION: This exam was performed according to the departmental dose-optimization program which includes automated exposure control, adjustment of the mA and/or kV according to patient size and/or use of iterative reconstruction technique. COMPARISON:  None Available. FINDINGS: Alignment: There is approximately 3 mm anterolisthesis of the C3 vertebral body on C4. Reversal of the normal cervical spine lordosis is also noted. Skull base and vertebrae: No acute fracture. Chronic and  degenerative changes are seen involving the body and tip of the dens, as well as the adjacent portion of the anterior arch of C1. Soft tissues and spinal canal: No prevertebral fluid or swelling. No visible canal hematoma. Disc levels: Marked severity endplate sclerosis is seen at the levels of C5-C6 and C6-C7, with moderate severity endplate sclerosis noted at C3-C4 and C4-C5. There is marked severity narrowing of the anterior atlantoaxial articulation. Marked severity intervertebral disc space narrowing is seen at C3-C4, C4-C5, C5-C6 and C6-C7. Marked severity bilateral facet joint hypertrophy is seen at the level of C3-C4, with marked severity left-sided facet joint hypertrophy noted at C2-C3. Moderate severity bilateral facet joint hypertrophy is noted throughout the remainder of the cervical spine. Upper chest: Negative. Other: None. IMPRESSION: 1. No acute fracture or traumatic malalignment of the cervical spine. 2. Marked severity multilevel degenerative changes, as described above. 3. 3 mm anterolisthesis of the C3 vertebral body on C4. 4. Reversal of the normal cervical spine lordosis, which may be secondary to positioning  or muscle spasm. Electronically Signed   By: Virgina Norfolk M.D.   On: 11/02/2021 23:59   CT Head Wo Contrast  Result Date: 11/02/2021 CLINICAL DATA:  Status post fall. EXAM: CT HEAD WITHOUT CONTRAST TECHNIQUE: Contiguous axial images were obtained from the base of the skull through the vertex without intravenous contrast. RADIATION DOSE REDUCTION: This exam was performed according to the departmental dose-optimization program which includes automated exposure control, adjustment of the mA and/or kV according to patient size and/or use of iterative reconstruction technique. COMPARISON:  None Available. FINDINGS: Brain: There is moderate severity cerebral atrophy with widening of the extra-axial spaces and ventricular dilatation. There are areas of decreased attenuation within the  white matter tracts of the supratentorial brain, consistent with microvascular disease changes. Vascular: No hyperdense vessel or unexpected calcification. Skull: Normal. Negative for fracture or focal lesion. Sinuses/Orbits: No acute finding. Other: None. IMPRESSION: 1. No acute intracranial abnormality. 2. Generalized cerebral atrophy and microvascular disease changes of the supratentorial brain. Electronically Signed   By: Virgina Norfolk M.D.   On: 11/02/2021 23:56   DG Shoulder Left  Result Date: 11/02/2021 CLINICAL DATA:  Fall. EXAM: LEFT SHOULDER - 2+ VIEW COMPARISON:  None Available. FINDINGS: There is an acute mildly comminuted fracture through the left humeral neck. There is 1 shaft with lateral displacement of the distal fracture fragment. There is apex lateral angulation. No dislocation. There are mild degenerative changes of the acromioclavicular joint. There are multiple healed left-sided rib fractures. IMPRESSION: 1. Acute displaced left humeral neck fracture. Electronically Signed   By: Ronney Asters M.D.   On: 11/02/2021 21:54    Dr. Nevada Crane to admit to hospitalist service.  [facility] consulted for her fracture by Dr. Billy Fischer.   Helma Argyle, Jenny Reichmann, MD 11/03/21 [phone]

## 2021-11-03 NOTE — Evaluation (Signed)
Physical Therapy Evaluation Patient Details Name: Cheryl Salas MRN: [id number] DOB: 14-Jan-1924 Today's Date: 11/03/2021  History of Present Illness  Cheryl Salas is a 88 y.o. female with medical history significant for essential hypertension, type 2 diabetes, hyperlipidemia, CKD 3B, prior diverticulitis status post ileostomy, hysterectomy/bladder suspension, history of bladder/breast cancer with bilateral mastectomy, ambulatory dysfunction with use of a walker, who presented to [facility] ED from home with complaints of left shoulder pain after a mechanical fall on 11/02/21 and sustained left displaced  humeral neck fracture, conservative management with sling  Clinical Impression   Patient admitted for above problems, patient son at bedside. Patient resides alone and uses a  RW at baseline. Patient endorses LUE pain and back pain is   chronic. Reports that she has had medication.  Patient requiring +2 max assistance to stand at the bedside on a stool due to patient  stature and stretcher  height. Patient and son agreeable to SNF for rehab.  Noted patient HR up to 140 during activity of standing briefly and returning to supine. Pt admitted with above diagnosis.  Pt currently with functional limitations due to the deficits listed below (see PT Problem List). Pt will benefit from skilled PT to increase their independence and safety with mobility to allow discharge to the venue listed below.      Recommendations for follow up therapy are one component of a multi-disciplinary discharge planning process, led by the attending physician.  Recommendations may be updated based on patient status, additional functional criteria and insurance authorization.  Follow Up Recommendations Skilled nursing-short term rehab (<3 hours/day) Can patient physically be transported by private vehicle: No    Assistance Recommended at Discharge Frequent or constant Supervision/Assistance  Patient can return home with the  following  Two people to help with walking and/or transfers;A lot of help with bathing/dressing/bathroom;Assistance with cooking/housework;Assist for transportation;Help with stairs or ramp for entrance    Equipment Recommendations None recommended by PT  Recommendations for Other Services       Functional Status Assessment Patient has had a recent decline in their functional status and demonstrates the ability to make significant improvements in function in a reasonable and predictable amount of time.     Precautions / Restrictions Precautions Precautions: Fall Precaution Comments: R arm sling, ostomy Required Braces or Orthoses: Sling Restrictions Weight Bearing Restrictions: Yes RUE Weight Bearing: Non weight bearing      Mobility  Bed Mobility Overal bed mobility: Needs Assistance Bed Mobility: Rolling, Sidelying to Sit, Sit to Supine Rolling: Min assist Sidelying to sit: Mod assist   Sit to supine: Mod assist   General bed mobility comments: assist  with trunk to upright,Patient initiated placing legs on to bed and able to return to supine once legs on bed    Transfers Overall transfer level: Needs assistance   Transfers: Sit to/from Stand Sit to Stand: Mod assist, +2 physical assistance, +2 safety/equipment           General transfer comment: Step stool with Right side rail used to stand from stretcher due to patient short stature. Patient stood briefly with max support. Unabl eot step due to being on stool.    Ambulation/Gait                  Stairs            Wheelchair Mobility    Modified Rankin (Stroke Patients Only)       Balance Overall balance assessment: Needs assistance, History of  Falls Sitting-balance support: Single extremity supported, Feet unsupported Sitting balance-Leahy Scale: Poor Sitting balance - Comments: tends to lean posteriorly Postural control: Posterior lean Standing balance support: Single extremity  supported, During functional activity, Reliant on assistive device for balance Standing balance-Leahy Scale: Poor Standing balance comment: max support for balance on step stool                             Pertinent Vitals/Pain Pain Assessment Pain Assessment: 0-10 Pain Score: 10-Worst pain ever Pain Location: LUE and back Pain Descriptors / Indicators: Discomfort, Guarding Pain Intervention(s): Premedicated before session, Monitored during session, Repositioned, Limited activity within patient's tolerance    Home Living Family/patient expects to be discharged to:: Skilled nursing facility                   Additional Comments: lives alone    Prior Function Prior Level of Function : Independent/Modified Independent             Mobility Comments: uses a RW       Hand Dominance   Dominant Hand: Right    Extremity/Trunk Assessment   Upper Extremity Assessment Upper Extremity Assessment: LUE deficits/detail LUE Deficits / Details: in sling with education on proper positioning and not attempting to use LUE to adjust sling. current NO ROM and NO WB orders at this time    Lower Extremity Assessment Lower Extremity Assessment: Defer to PT evaluation    Cervical / Trunk Assessment Cervical / Trunk Assessment: Kyphotic  Communication   Communication: No difficulties  Cognition Arousal/Alertness: Awake/alert Behavior During Therapy: WFL for tasks assessed/performed Overall Cognitive Status: Within Functional Limits for tasks assessed                                          General Comments      Exercises     Assessment/Plan    PT Assessment Patient needs continued PT services  PT Problem List Decreased mobility;Decreased range of motion;Decreased knowledge of precautions;Decreased activity tolerance;Decreased balance;Decreased knowledge of use of DME;Pain;Cardiopulmonary status limiting activity       PT Treatment  Interventions DME instruction;Therapeutic activities;Gait training;Therapeutic exercise;Patient/family education;Functional mobility training    PT Goals (Current goals can be found in the Care Plan section)  Acute Rehab PT Goals Patient Stated Goal: to go to rehab PT Goal Formulation: With patient/family Time For Goal Achievement: 11/17/21 Potential to Achieve Goals: Good    Frequency Min 2X/week     Co-evaluation PT/OT/SLP Co-Evaluation/Treatment: Yes Reason for Co-Treatment: For patient/therapist safety;To address functional/ADL transfers PT goals addressed during session: Mobility/safety with mobility OT goals addressed during session: ADL's and self-care       AM-PAC PT "6 Clicks" Mobility  Outcome Measure Help needed turning from your back to your side while in a flat bed without using bedrails?: Total Help needed moving from lying on your back to sitting on the side of a flat bed without using bedrails?: Total Help needed moving to and from a bed to a chair (including a wheelchair)?: Total Help needed standing up from a chair using your arms (e.g., wheelchair or bedside chair)?: Total Help needed to walk in hospital room?: Total Help needed climbing 3-5 steps with a railing? : Total 6 Click Score: 6    End of Session Equipment Utilized During Treatment: Gait belt Activity Tolerance:  Patient limited by pain Patient left: in bed;with call bell/phone within reach;with family/visitor present Nurse Communication: Mobility status PT Visit Diagnosis: Unsteadiness on feet (R26.81);Muscle weakness (generalized) (M62.81);History of falling (Z91.81);Pain Pain - Right/Left: Left Pain - part of body: Arm;Shoulder    Time: 1610-9604 PT Time Calculation (min) (ACUTE ONLY): 24 min   Charges:   PT Evaluation $PT Eval Low Complexity: 1 Low          [place] Office [phone] Weekend [phone]   Claretha Cooper 11/03/2021, 9:55 AM

## 2021-11-03 NOTE — Progress Notes (Signed)
  Progress Note   Patient: Cheryl Salas AXK:[id number] DOB: [birth year] DOA: 11/02/2021     0 DOS: the patient was seen and examined on 11/03/2021 at 10:10AM      Brief hospital course: Mrs. Hitch is a 88 y.o. F with HTN, DM, CKD who lives independently and fell while using her walker, had immediate left arm pain.  X-ray in the ER showed left humerus fracture.  This is a no charge note, for further details please see the H&P by my partner Dr. [place] Crane from earlier today -PT OT/TOC -Patient required 4 doses of IV opiates overnight to control pain     Assessment and Plan: Principal Problem:   Humeral fracture Active Problems:   Iron deficiency anemia   Ileostomy status ([place])   Stage 3b chronic kidney disease (CKD) ([place])   Type 2 diabetes mellitus with complication, without long-term current use of insulin ([place])   Essential hypertension   Hyperlipidemia   Hypomagnesemia                 Physical Exam: Vitals:   11/03/21 1300 11/03/21 1615 11/03/21 1706 11/03/21 1832  BP: (!) 94/53 96/70 123/76 127/67  Pulse: 65 77 98 87  Resp: '14 15 16 15  '$ Temp:  98.9 F (37.2 C) 98.8 F (37.1 C) 98.6 F (37 C)  TempSrc:  Oral Oral Oral  SpO2: 96% 94% 97% 97%  Weight:      Height:            Family Communication: Son at the bedside    Disposition: Status is: Inpatient         Author: Edwin Dada, MD 11/03/2021 7:40 PM  For on call review [email or web address].

## 2021-11-03 NOTE — Hospital Course (Addendum)
Cheryl Salas is a 88 y.o. F with PMH DMII, CKD IIIb, hx diverticulitis s/p ileostomy, and hx bladder/breast cancer with bilateral mastectomy wh presented with left arm pain after a mechanical fall.    Workup was notable for a proximal left humeral fracture with angulation and lateral displacement.  She was evaluated by orthopedic surgery and underwent ORIF for repair on 11/06/2021.

## 2021-11-03 NOTE — Consult Note (Signed)
Chief Complaint: Status post fall with left proximal humerus fracture  History: Cheryl Salas is a 88 y.o. female with medical history significant for essential hypertension, type 2 diabetes, hyperlipidemia, CKD 3B, prior diverticulitis status post ileostomy, hysterectomy/bladder suspension, history of bladder/breast cancer with bilateral mastectomy, ambulatory dysfunction with use of a walker, who presented to [facility] ED from home with complaints of left shoulder pain after a mechanical fall.  She tripped while using her walker.  She was in her usual state of health prior to this.  EMS was activated.     She was brought into the ED for further evaluation.  X-ray done in the ED showed left acute displaced left humeral neck fracture.  EDP discussed the case with orthopedic surgery who recommended conservative management with a sling.  Unable to discharge home due to uncontrolled pain.  EDP requested admission for pain control and placement.  TR[facility], hospitalist service, was asked to admit.  Past Medical History:  Diagnosis Date   Allergic rhinitis    Per Records from Dr.Altheimer    Anemia    Per Records from Dr.Altheimer    Arthralgia of multiple sites    Per Records from [place]    B12 deficiency    Per Records from [place]    Baker's cyst    Per Records from [place]    Bladder cancer [facility])    CKD (chronic kidney disease) stage 3, GFR 30-59 ml/min ([place])    Per Records from [place]    Dehydration    Per Records from [place]    Dextroscoliosis    Per Records from [place]    Diabetes [facility])    Diverticulitis    Per Records from [place]    Diverticulosis    Fecal occult blood test positive    Per Records from Dr.Altheimer    Gout    left foot   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[place])    Per Records from [place]    Hypertension    Per Records from Dr.Altheimer    Hypocalcemia    Per Records from Dr.Altheimer    Hypomagnesemia    Per Records from  Dr.Altheimer    Iron deficiency    Per Records from [place]    Large bowel obstruction [facility])    Per Records from [place]    Mixed dyslipidemia    Per Records from Dr.Altheimer    Myopathy    Per Records from Dr.Altheimer    Onychomycosis    Per Records from Dr.Altheimer    Peripheral neuropathy    Per Records from Dr.Altheimer    Recurrent UTI    Vitamin D deficiency    Per Records from Dr.Altheimer     Allergies  Allergen Reactions   Codeine Itching, Nausea Only and Swelling   Flexeril [Cyclobenzaprine]     Per Records from Dr.Altheimer     No current facility-administered medications on file prior to encounter.   Current Outpatient Medications on File Prior to Encounter  Medication Sig Dispense Refill   alendronate (FOSAMAX) 70 MG tablet TAKE 1 TABLET BY MOUTH ONCE A WEEK WITH A FULL GLASS OF WATER ON AN EMPTY STOMACH 4 tablet 6   ASPIRIN LOW DOSE 81 MG EC tablet Take 81 mg by mouth daily.     Cholecalciferol (D3 HIGH POTENCY) 50 MCG (2000 UT) CAPS Take 1 capsule (2,000 Units total) by mouth daily. Needs an appointment before anymore future refills. 90 capsule 0   diphenoxylate-atropine (LOMOTIL) 2.5-0.025 MG tablet TAKE 1  TABLET BY MOUTH EVERY MORNING, 1 TABLET AT LUNCH, AND 1 TABLET AT dinner (Patient taking differently: Take 1 tablet by mouth in the morning, at noon, and at bedtime.) 84 tablet 5   empagliflozin (JARDIANCE) 25 MG TABS tablet Take 1 tablet (25 mg total) by mouth daily before breakfast. 90 tablet 3   hydrALAZINE (APRESOLINE) 25 MG tablet Take 25 mg by mouth 3 (three) times daily.     iron polysaccharides (FERREX 150) 150 MG capsule Take one capsule by mouth once daily. Needs an appointment before anymore future refills. (Patient taking differently: Take 150 mg by mouth daily. Take one capsule by mouth once daily. Needs an appointment before anymore future refills.) 90 capsule 0   loperamide (IMODIUM) 2 MG capsule TAKE 1 CAPSULE BY MOUTH EVERY MORNING,  1 CAPSULE AT LUNCH, AND 1 CAPSULE AT dinner (Patient taking differently: Take 2 mg by mouth in the morning, at noon, and at bedtime.) 84 capsule 5   rosuvastatin (CRESTOR) 10 MG tablet Take 10 mg by mouth daily.     sodium bicarbonate 650 MG tablet Take 1 tablet (650 mg total) by mouth 2 (two) times daily. Needs an appointment before anymore future refills. 180 tablet 0   traMADol (ULTRAM) 50 MG tablet Take 1 tablet (50 mg total) by mouth daily as needed (pain). 30 tablet 0   vitamin C (ASCORBIC ACID) 500 MG tablet Take 1 tablet (500 mg total) by mouth daily. Needs an appointment before anymore future refills. 90 tablet 0   zolpidem (AMBIEN) 5 MG tablet TAKE 1 TABLET BY MOUTH AT BEDTIME AS NEEDED FOR SLEEP (Patient taking differently: Take 5 mg by mouth at bedtime as needed for sleep.) 30 tablet 5   carbamide peroxide (DEBROX) 6.5 % OTIC solution Place 5 drops into both ears at bedtime as needed (x 1 week). (Patient not taking: Reported on 11/02/2021) 15 mL 0   ondansetron (ZOFRAN-ODT) 4 MG disintegrating tablet Take 1 tablet (4 mg total) by mouth every 8 (eight) hours as needed for nausea or vomiting. (Patient not taking: Reported on 11/02/2021) 10 tablet 0   psyllium (METAMUCIL) 58.6 % packet Take 1 packet by mouth at bedtime. (Patient not taking: Reported on 11/02/2021) 30 each 3    Physical Exam: Vitals:   11/03/21 1615 11/03/21 1706  BP: 96/70 123/76  Pulse: 77 98  Resp: 15 16  Temp: 98.9 F (37.2 C) 98.8 F (37.1 C)  SpO2: 94% 97%   Body mass index is 25.96 kg/m. She is alert and oriented x3. No shortness of breath or chest pain. Pain and discomfort with palpation of the left shoulder.  No gross deformity.  Closed injury with no ecchymosis mild bruising. No elbow, wrist pain with isolated joint range of motion.  Neurological exam is intact with normal sensation and strength at the wrist and elbow. No neck pain with palpation and range of motion. Intact peripheral pulses bilaterally in  the upper extremity.    Image: CT Shoulder Left Wo Contrast  Result Date: 11/03/2021 CLINICAL DATA:  Fall with proximal left humeral fracture. EXAM: CT OF THE UPPER LEFT EXTREMITY WITHOUT CONTRAST TECHNIQUE: Multidetector CT imaging of the upper left extremity was performed according to the standard protocol. RADIATION DOSE REDUCTION: This exam was performed according to the departmental dose-optimization program which includes automated exposure control, adjustment of the mA and/or kV according to patient size and/or use of iterative reconstruction technique. COMPARISON:  Right shoulder series earlier today. FINDINGS: Bones/Joint/Cartilage Generalized osteopenia. Again noted  is an acute transverse surgical neck fracture of the proximal left humerus. The distal fragment is impacted and cephalad displaced up to 2 cm with respect to the proximal fragment, displaced anteriorly up to an entire shaft width, displaced laterally by about 1/2 of a shaft's width, with mild posteromedial angulation. There are small comminution fragments along side the fracture margins. The left clavicle, sternoclavicular and AC joints, and visualized portion of the scapula are intact. The visualized left ribs do not show evidence of a displaced fracture. There is advanced degenerative change of the visualized cervical spine. Evaluation of the glenohumeral joint demonstrates labral chondrocalcinosis and mild periarticular spurring changes, but only slight joint space loss. There are periarticular spurs and calcified pannus involving the [facility] joint and sternoclavicular joint. There is a small glenohumeral joint effusion with scattered calcific debris in the joint space. Ligaments Suboptimally assessed by CT. Muscles and Tendons There are calcifications within the rotator cuff tendons, relatively mild narrowing is seen along the acromial humeral space and only mild atrophy in the supraspinatus muscle belly with other rotator cuff muscles  showing normal bulk. No acute tendon rupture is seen. The proximal end of the distal fracture fragment protrudes up into the anterior aspect of the deltoid muscle with swelling and soft tissue fullness in the anterior left deltoid. There is no other focal muscular acute abnormality. Soft tissues There is moderate subcutaneous stranding in the anterior left shoulder, likely ecchymosis. No space-occupying hematoma is seen. There is atherosclerotic disease in the aortic arch. IMPRESSION: 1. Acute surgical neck proximal left humeral fracture with mild posteromedial angulation, anterior and lateral displacement of the distal fragment, and cephalad displacement with the proximal margin of the distal fragment having extended into the anterior deltoid muscle with swelling in the muscle and overlying ecchymosis. 2. Osteopenia, degenerative changes, and chondrocalcinosis, the latter consistent with CPPD. 3. Scattered calcific debris in the glenohumeral joint space, calcified pannus in the [facility] joint and sternoclavicular joint. 4. Rotator cuff tendon calcifications, could be related CPPD and/or calcific tendinopathy. 5. Aortic atherosclerosis. Electronically Signed   By: Telford Nab M.D.   On: 11/03/2021 00:25   CT Cervical Spine Wo Contrast  Result Date: 11/02/2021 CLINICAL DATA:  Status post fall. EXAM: CT CERVICAL SPINE WITHOUT CONTRAST TECHNIQUE: Multidetector CT imaging of the cervical spine was performed without intravenous contrast. Multiplanar CT image reconstructions were also generated. RADIATION DOSE REDUCTION: This exam was performed according to the departmental dose-optimization program which includes automated exposure control, adjustment of the mA and/or kV according to patient size and/or use of iterative reconstruction technique. COMPARISON:  None Available. FINDINGS: Alignment: There is approximately 3 mm anterolisthesis of the C3 vertebral body on C4. Reversal of the normal cervical spine lordosis is  also noted. Skull base and vertebrae: No acute fracture. Chronic and degenerative changes are seen involving the body and tip of the dens, as well as the adjacent portion of the anterior arch of C1. Soft tissues and spinal canal: No prevertebral fluid or swelling. No visible canal hematoma. Disc levels: Marked severity endplate sclerosis is seen at the levels of C5-C6 and C6-C7, with moderate severity endplate sclerosis noted at C3-C4 and C4-C5. There is marked severity narrowing of the anterior atlantoaxial articulation. Marked severity intervertebral disc space narrowing is seen at C3-C4, C4-C5, C5-C6 and C6-C7. Marked severity bilateral facet joint hypertrophy is seen at the level of C3-C4, with marked severity left-sided facet joint hypertrophy noted at C2-C3. Moderate severity bilateral facet joint hypertrophy is noted throughout  the remainder of the cervical spine. Upper chest: Negative. Other: None. IMPRESSION: 1. No acute fracture or traumatic malalignment of the cervical spine. 2. Marked severity multilevel degenerative changes, as described above. 3. 3 mm anterolisthesis of the C3 vertebral body on C4. 4. Reversal of the normal cervical spine lordosis, which may be secondary to positioning or muscle spasm. Electronically Signed   By: Virgina Norfolk M.D.   On: 11/02/2021 23:59   CT Head Wo Contrast  Result Date: 11/02/2021 CLINICAL DATA:  Status post fall. EXAM: CT HEAD WITHOUT CONTRAST TECHNIQUE: Contiguous axial images were obtained from the base of the skull through the vertex without intravenous contrast. RADIATION DOSE REDUCTION: This exam was performed according to the departmental dose-optimization program which includes automated exposure control, adjustment of the mA and/or kV according to patient size and/or use of iterative reconstruction technique. COMPARISON:  None Available. FINDINGS: Brain: There is moderate severity cerebral atrophy with widening of the extra-axial spaces and  ventricular dilatation. There are areas of decreased attenuation within the white matter tracts of the supratentorial brain, consistent with microvascular disease changes. Vascular: No hyperdense vessel or unexpected calcification. Skull: Normal. Negative for fracture or focal lesion. Sinuses/Orbits: No acute finding. Other: None. IMPRESSION: 1. No acute intracranial abnormality. 2. Generalized cerebral atrophy and microvascular disease changes of the supratentorial brain. Electronically Signed   By: Virgina Norfolk M.D.   On: 11/02/2021 23:56   DG Shoulder Left  Result Date: 11/02/2021 CLINICAL DATA:  Fall. EXAM: LEFT SHOULDER - 2+ VIEW COMPARISON:  None Available. FINDINGS: There is an acute mildly comminuted fracture through the left humeral neck. There is 1 shaft with lateral displacement of the distal fracture fragment. There is apex lateral angulation. No dislocation. There are mild degenerative changes of the acromioclavicular joint. There are multiple healed left-sided rib fractures. IMPRESSION: 1. Acute displaced left humeral neck fracture. Electronically Signed   By: Ronney Asters M.D.   On: 11/02/2021 21:54    A/P: At the very pleasant 88 year old man who had a fall and has a left proximal humeral neck fracture.  Fortunately the shoulder joint is not dislocated and this is a closed injury.  At this point I recommend she maintain a sling.  Given her poor bone quality I do not think surgical intervention is advisable.  I will review her images with my shoulder expert Dr. Stann Mainland and further recommendations pending.  This time she can with physical therapy and progress to discharge.  She will need a sling at all times.

## 2021-11-04 ENCOUNTER — Inpatient Hospital Stay (HOSPITAL_COMMUNITY): Payer: Medicare Other

## 2021-11-04 DIAGNOSIS — I10 Essential (primary) hypertension: Secondary | ICD-10-CM

## 2021-11-04 DIAGNOSIS — S42212A Unspecified displaced fracture of surgical neck of left humerus, initial encounter for closed fracture: Secondary | ICD-10-CM | POA: Diagnosis not present

## 2021-11-04 DIAGNOSIS — E785 Hyperlipidemia, unspecified: Secondary | ICD-10-CM | POA: Diagnosis not present

## 2021-11-04 DIAGNOSIS — M25552 Pain in left hip: Secondary | ICD-10-CM

## 2021-11-04 LAB — CBC
HCT: 27 % — ABNORMAL LOW (ref 36.0–46.0)
Hemoglobin: 8.7 g/dL — ABNORMAL LOW (ref 12.0–15.0)
MCH: 31.5 pg (ref 26.0–34.0)
MCHC: 32.2 g/dL (ref 30.0–36.0)
MCV: 97.8 fL (ref 80.0–100.0)
Platelets: 153 10*3/uL (ref 150–400)
RBC: 2.76 MIL/uL — ABNORMAL LOW (ref 3.87–5.11)
RDW: 13.8 % (ref 11.5–15.5)
WBC: 6 10*3/uL (ref 4.0–10.5)
nRBC: 0 % (ref 0.0–0.2)

## 2021-11-04 LAB — MAGNESIUM: Magnesium: 2.1 mg/dL (ref 1.7–2.4)

## 2021-11-04 LAB — GLUCOSE, CAPILLARY
Glucose-Capillary: 134 mg/dL — ABNORMAL HIGH (ref 70–99)
Glucose-Capillary: 160 mg/dL — ABNORMAL HIGH (ref 70–99)
Glucose-Capillary: 114 mg/dL — ABNORMAL HIGH (ref 70–99)
Glucose-Capillary: 275 mg/dL — ABNORMAL HIGH (ref 70–99)

## 2021-11-04 MED ORDER — ZOLPIDEM TARTRATE 5 MG PO TABS
5.0000 mg | ORAL_TABLET | Freq: Every evening | ORAL | Status: DC | PRN
Start: 2021-11-04 — End: 2021-11-10
  Administered 2021-11-09: 5 mg via ORAL
  Filled 2021-11-04: qty 1

## 2021-11-04 MED ORDER — DIPHENOXYLATE-ATROPINE 2.5-0.025 MG PO TABS: 1.0000 | ORAL_TABLET | Freq: Four times a day (QID) | ORAL | Status: DC | PRN

## 2021-11-04 MED ORDER — LOPERAMIDE HCL 2 MG PO CAPS
2.0000 mg | ORAL_CAPSULE | ORAL | Status: DC | PRN
Start: 2021-11-04 — End: 2021-11-10

## 2021-11-04 MED ORDER — ASPIRIN 81 MG PO TBEC
81.0000 mg | DELAYED_RELEASE_TABLET | Freq: Every day | ORAL | Status: DC
  Administered 2021-11-05 – 2021-11-10 (×5): 81 mg via ORAL
  Filled 2021-11-04 (×5): qty 1

## 2021-11-04 NOTE — Assessment & Plan Note (Addendum)
-   Hgb slightly down; likely from large bruising on left shoulder - baseline ~10 - 11 g/dL - stable and back to near baseline at discharge

## 2021-11-04 NOTE — Plan of Care (Signed)
  Problem: Education: Goal: Ability to describe self-care measures that may prevent or decrease complications (Diabetes Survival Skills Education) will improve Outcome: Progressing   Problem: Coping: Goal: Ability to adjust to condition or change in health will improve Outcome: Progressing   Problem: Fluid Volume: Goal: Ability to maintain a balanced intake and output will improve Outcome: Progressing   Problem: Health Behavior/Discharge Planning: Goal: Ability to identify and utilize available resources and services will improve Outcome: Progressing   Problem: Metabolic: Goal: Ability to maintain appropriate glucose levels will improve Outcome: Progressing   Problem: Nutritional: Goal: Maintenance of adequate nutrition will improve Outcome: Progressing   Problem: Skin Integrity: Goal: Risk for impaired skin integrity will decrease Outcome: Progressing   Problem: Tissue Perfusion: Goal: Adequacy of tissue perfusion will improve Outcome: Progressing

## 2021-11-04 NOTE — Progress Notes (Signed)
    Subjective:    Patient reports pain as 4 on 0-10 scale.   Denies CP or SOB.  Voiding without difficulty. Positive flatus. Objective: Vital signs in last 24 hours: Temp:  [98.2 F (36.8 C)-98.9 F (37.2 C)] 98.5 F (36.9 C) (08/29 0620) Pulse Rate:  [65-104] 88 (08/29 0620) Resp:  [13-20] 17 (08/29 0620) BP: (94-127)/(53-76) 125/59 (08/29 0620) SpO2:  [92 %-98 %] 96 % (08/29 0620)  Intake/Output from previous day: 08/28 0701 - 08/29 0700 In: -  Out: 150 [Urine:150] Intake/Output this shift: No intake/output data recorded.  Labs: Recent Labs    11/02/21 2055 11/03/21 0445 11/04/21 0330  HGB 11.0* 9.8* 8.7*   Recent Labs    11/03/21 0445 11/04/21 0330  WBC 7.3 6.0  RBC 3.11* 2.76*  HCT 30.3* 27.0*  PLT 161 153   Recent Labs    11/02/21 2055 11/03/21 0445  NA 137 137  K 4.1 4.0  CL 107 108  CO2 19* 23  BUN 28* 31*  CREATININE 1.41* 1.23*  GLUCOSE 249* 142*  CALCIUM 9.0 8.7*   No results for input(s): "LABPT", "INR" in the last 72 hours.  Physical Exam: Neurologically intact Sensation intact distally Compartment soft Body mass index is 25.96 kg/m.   Assessment/Plan:    Spoke with Dr. Stann Mainland.  Patient can be treated conservatively without surgery to more than likely this would result in significant decreased range of motion and function.  The patient was living alone and was highly functioning.  At this point I will have Dr. Stann Mainland see the patient to discuss surgical intervention to help improve her function and range of motion.    Dahlia Bailiff for Dr. Melina Schools [facility] [phone] 11/04/2021, 8:04 AM

## 2021-11-04 NOTE — NC FL2 (Signed)
[place] LEVEL OF CARE SCREENING TOOL     IDENTIFICATION  Patient Name: Cheryl Salas Birthdate: 05/10/1923 Sex: female Admission Date (Current Location): 11/02/2021  [facility] and [place] Number:  Herbalist and Address:  [facility] [place] [facility],  [address], [place]      Provider Number: [id number]  Attending Physician Name and Address:  Edwin Dada, *  Relative Name and Phone Number:  Jude Ormand (son) Ph: [phone]    Current Level of Care: Hospital Recommended Level of Care: [place] Prior Approval Number:    Date Approved/Denied:   PASRR Number: [id number]  Discharge Plan: SNF    Current Diagnoses: Patient Active Problem List   Diagnosis Date Noted   Humeral fracture 11/03/2021   Hypomagnesemia 11/03/2021   SOB (shortness of breath) 06/25/2021   Type 2 diabetes mellitus with complication, without long-term current use of insulin ([place]) 06/24/2021   Iron deficiency anemia 06/24/2021   Essential hypertension 06/24/2021   Hyperlipidemia 06/24/2021   Pubic ramus fracture ([place]) 05/18/2018   Type 2 diabetes mellitus with stage 3 chronic kidney disease, without long-term current use of insulin ([place]) 12/13/2017   Bilateral hearing loss due to cerumen impaction 12/13/2017   Loose stools 12/13/2017   Ileostomy status ([place]) 12/13/2017   Diverticulosis 12/13/2017   Stage 3b chronic kidney disease (CKD) ([place]) 12/13/2017   Senile osteoporosis 12/13/2017   Absolute anemia 12/13/2017   Localized edema 12/13/2017   Sinus tachycardia 12/13/2017   History of recurrent UTIs 12/13/2017   History of breast cancer 12/13/2017   History of bladder cancer 12/13/2017    Orientation RESPIRATION BLADDER Height & Weight     Self, Time, Situation, Place  Normal Continent Weight: 128 lb 8.5 oz (58.3 kg) Height:  '4\' 11"'$  (149.9 cm)  BEHAVIORAL SYMPTOMS/MOOD NEUROLOGICAL BOWEL NUTRITION STATUS   (N/A)  (N/A)  Ileostomy Diet (Heart healthy/carb modified)  AMBULATORY STATUS COMMUNICATION OF NEEDS Skin   Extensive Assist Verbally Other (Comment) (Ecchymosis: left shoulder; Scratches: right arm)                       Personal Care Assistance Level of Assistance  Bathing, Feeding, Dressing Bathing Assistance: Maximum assistance Feeding assistance: Limited assistance Dressing Assistance: Maximum assistance     Functional Limitations Info  Sight, Hearing, Speech Sight Info: Adequate Hearing Info: Impaired Speech Info: Adequate    SPECIAL CARE FACTORS FREQUENCY  PT (By licensed PT), OT (By licensed OT)     PT Frequency: 5x's/week OT Frequency: 5x's/week            Contractures Contractures Info: Not present    Additional Factors Info  Code Status, Allergies Code Status Info: Full Allergies Info: Codeine, Flexeril (Cyclobenzaprine)           Current Medications (11/04/2021):  This is the current hospital active medication list Current Facility-Administered Medications  Medication Dose Route Frequency Provider Last Rate Last Admin   acetaminophen (TYLENOL) tablet 1,000 mg  1,000 mg Oral Q8H Danford, Suann Larry, MD   1,000 mg at 11/04/21 0556   enoxaparin (LOVENOX) injection 30 mg  30 mg Subcutaneous Q24H Hall, Carole N, DO   30 mg at 11/04/21 0840   hydrALAZINE (APRESOLINE) tablet 25 mg  25 mg Oral TID Irene Pap N, DO   25 mg at 11/03/21 2136   HYDROmorphone (DILAUDID) injection 0.5 mg  0.5 mg Intravenous Q4H PRN Irene Pap N, DO   0.5 mg at  11/03/21 0501   insulin aspart (novoLOG) injection 0-5 Units  0-5 Units Subcutaneous QHS Kayleen Memos, DO   2 Units at 11/03/21 2141   insulin aspart (novoLOG) injection 0-9 Units  0-9 Units Subcutaneous TID WC Irene Pap N, DO   1 Units at 11/04/21 0840   melatonin tablet 5 mg  5 mg Oral QHS PRN Irene Pap N, DO       oxyCODONE (Oxy IR/ROXICODONE) immediate release tablet 5 mg  5 mg Oral Q6H PRN Irene Pap N, DO        polyethylene glycol (MIRALAX / GLYCOLAX) packet 17 g  17 g Oral Daily PRN Irene Pap N, DO       prochlorperazine (COMPAZINE) injection 10 mg  10 mg Intravenous Q6H PRN Hall, Carole N, DO       rosuvastatin (CRESTOR) tablet 10 mg  10 mg Oral Daily Irene Pap N, DO   10 mg at 11/04/21 1638   senna-docusate (Senokot-S) tablet 1 tablet  1 tablet Oral QHS Irene Pap N, DO   1 tablet at 11/03/21 2136     Discharge Medications: Please see discharge summary for a list of discharge medications.  Relevant Imaging Results:  Relevant Lab Results:   Additional Information SSN: [id number]  Sherie Don, LCSW

## 2021-11-04 NOTE — Assessment & Plan Note (Addendum)
-   Seems over the trochanter.  No pain with ROM of the left hip.  No bruising to suggest hematoma. - xray obtained on 8/29 shows old healed B/L superior and inferior pubic rami fractures; diffuse decreased bone mineralization noted  - will start her on Ca + Vit D as well

## 2021-11-04 NOTE — Assessment & Plan Note (Addendum)
-   confirmed with patient and son that she is not on any anti-hypertensives at this time - continue monitoring BP and should it uptrend, would likely start ACEi or ARB as initial therapy (CKD not a contraindication) - did not require medication

## 2021-11-04 NOTE — Assessment & Plan Note (Addendum)
-   Supplemented as needed

## 2021-11-04 NOTE — Assessment & Plan Note (Addendum)
-   s/p mechanical fall; uses a walker at baseline but does have rather high functional baseline - imaging showed "Acute surgical neck proximal left humeral fracture with mild posteromedial angulation, anterior and lateral displacement of the distal fragment, and cephalad displacement with the proximal margin of the distal fragment having extended into the anterior deltoid muscle with swelling in the muscle and overlying ecchymosis." - s/p ORIF on 8/31 - plan is for [facility] on Monday  - outpt followup planned for 9/13

## 2021-11-04 NOTE — Assessment & Plan Note (Signed)
Baseline Cr 1.2-1.6, stable

## 2021-11-04 NOTE — Progress Notes (Signed)
  Progress Note   Patient: Cheryl Salas FVC:[id number] DOB: 1923-04-04 DOA: 11/02/2021     1 DOS: the patient was seen and examined on 11/04/2021 at 10:14AM and 5:20PM      Brief hospital course: Cheryl Salas is a 88 y.o. F with DM, CKD IIIb, hx diverticulitis s/p ileostomy, and hx bladder/breast cancer with bilateral mastectomy wh presented with left arm pain after a mechanical fall.           Assessment and Plan: * Humeral fracture Ortho evaluated.  Recommended nonoperative mgmt - Sling at all times - NWB LUE - Follow up with [facility]  Iron deficiency anemia Hgb slightly down, no clinical bleeding. - Continue iron at d/c  Left hip pain Seems over the trochanter.  No pain with ROM of the left hip.  No bruising to suggest hematoma. - Obtain Xray left hip and pelvis, if we rule out a pelvic fx, intertrochanteric hip fx or avulsion of greater trochanter, would recommend ice and analgesics  Hypomagnesemia - Supplement Mag  Hyperlipidemia - Continue Crestor  Essential hypertension, ruled out Patient confirms she has no diagnosis of HTN.  Her hydralazine was a new medicine, she doesn't need - Stop hydralazine  Type 2 diabetes mellitus with complication, without long-term current use of insulin (HCC) A1c 7.8%. Glucose normal here - Continue SS corrections - Hold Jardiance  Stage 3b chronic kidney disease (CKD) (HCC) Baseline Cr 1.2-1.6, stable  Ileostomy status (HCC)            Subjective: Patient has a left hip pain, over the greater trochanter, worse with going from sitting to standing.  No pain with weightbearing.  No fever, no confusion, had some insomnia last night.     Physical Exam: BP 107/64 (BP Location: Right Arm)   Pulse (!) 106   Temp 98.9 F (37.2 C) (Oral)   Resp 16   Ht '4\' 11"'$  (1.499 m)   Wt 58.3 kg   SpO2 98%   BMI 25.96 kg/m   Thin adult female, elderly, lying in bed, interactive and appropriate Heart rate slightly elevated, no  murmurs, no peripheral edema Respiratory rate normal, lungs clear without rales or wheezes Abdomen soft without tenderness palpation or guarding Attention normal, affect normal, judgment and insight appear at baseline, face symmetric, speech fluent There is no tenderness, ecchymosis/bruising, redness over the left greater trochanter but it is tender to palpation.  She has no pain with range of motion of the left hip gently, and is able to lift the left hip with 5 -/5 strength       Data Reviewed: CBC reviewed, hemoglobin 8.7 CT of the head and C-spine and shoulder show no acute changes in the head or spine, and left humeral neck fracture on CT of the left shoulder Hemoglobin A1c 7.8% Magnesium normalized  Family Communication: Son at the bedside    Disposition: Status is: Inpatient The patient was admitted with humerus fracture.  She initially required IV opiates for pain control.  She is now improved with acetaminophen, but is unable to care for self or ambulate and I feel it would be unsafe to discharge the patient to her independent living arrangement, will seek skilled nursing rehab        Author: Edwin Dada, MD 11/04/2021 5:35 PM  For on call review [email or web address].

## 2021-11-04 NOTE — Consult Note (Signed)
Reason for Consult: Severely displaced left proximal humerus  Referring Physician: Hospitalist service  HPI: Cheryl Salas is an 88 y.o. female who has been living independently status post mechanical fall sustaining a severely displaced left 2 part proximal humerus fracture.  I have been asked by my partners Dr. Rolena Salas and Dr. Stann Salas to see Cheryl Salas and discuss further treatment options.    Past Medical History:  Diagnosis Date   Allergic rhinitis    Per Records from Dr.Altheimer    Anemia    Per Records from Dr.Altheimer    Arthralgia of multiple sites    Per Records from [place]    B12 deficiency    Per Records from [place]    Baker's cyst    Per Records from [place]    Bladder cancer [facility])    CKD (chronic kidney disease) stage 3, GFR 30-59 ml/min ([place])    Per Records from [place]    Dehydration    Per Records from [place]    Dextroscoliosis    Per Records from [place]    Diabetes [facility])    Diverticulitis    Per Records from [place]    Diverticulosis    Fecal occult blood test positive    Per Records from [place]    Gout    left foot   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[place])    Per Records from [place]    Hypertension    Per Records from Dr.Altheimer    Hypocalcemia    Per Records from [place]    Hypomagnesemia    Per Records from Dr.Altheimer    Iron deficiency    Per Records from [place]    Large bowel obstruction ([place])    Per Records from [place]    Mixed dyslipidemia    Per Records from Dr.Altheimer    Myopathy    Per Records from [place]    Onychomycosis    Per Records from [place]    Peripheral neuropathy    Per Records from [place]    Recurrent UTI    Vitamin D deficiency    Per Records from [place]     Past Surgical History:  Procedure Laterality Date   BLADDER SUSPENSION     Per Records from [place]    hysterectomy  1966   ILEOSTOMY  2017   KYPHOPLASTY  2019   MASTECTOMY Right 1998   MASTECTOMY Left 2011   TRANSURETHRAL RESECTION OF BLADDER TUMOR      Family History  Problem Relation Age of Onset   Transient ischemic attack Mother 20   Heart attack Father 56   Pancreatic cancer Sister 45   Epilepsy Brother 31   Diabetes Brother 38   Dementia Sister 73   Diabetes Brother    Stroke Brother 58   Alcoholism Brother 30   Cancer - Other Son 58       liver cancer; [place]   Heart attack Son 41   Diabetes Son    Anuerysm Daughter 28       brain    Social History:  reports that she has never smoked. She has never used smokeless tobacco. She reports that she does not drink alcohol and does not use drugs.  Allergies:  Allergies  Allergen Reactions   Codeine Itching, Nausea Only and Swelling   Flexeril [Cyclobenzaprine]     Per Records from Dr.Altheimer     Medications: I have reviewed the patient's current  medications.  Results for orders placed or performed during the hospital encounter of 11/02/21 (from the past 48 hour(s))  CBC with Differential     Status: Abnormal   Collection Time: 11/02/21  8:55 PM  Result Value Ref Range   WBC 6.5 4.0 - 10.5 K/uL   RBC 3.50 (L) 3.87 - 5.11 MIL/uL   Hemoglobin 11.0 (L) 12.0 - 15.0 g/dL   HCT 34.3 (L) 36.0 - 46.0 %   MCV 98.0 80.0 - 100.0 fL   MCH 31.4 26.0 - 34.0 pg   MCHC 32.1 30.0 - 36.0 g/dL   RDW 13.8 11.5 - 15.5 %   Platelets 170 150 - 400 K/uL   nRBC 0.0 0.0 - 0.2 %   Neutrophils Relative % 54 %   Neutro Abs 3.6 1.7 - 7.7 K/uL   Lymphocytes Relative 38 %   Lymphs Abs 2.5 0.7 - 4.0 K/uL   Monocytes Relative 6 %   Monocytes Absolute 0.4 0.1 - 1.0 K/uL   Eosinophils Relative 1 %   Eosinophils Absolute 0.0 0.0 - 0.5 K/uL   Basophils Relative 1 %   Basophils Absolute 0.0 0.0 - 0.1 K/uL   Immature Granulocytes 0 %   Abs Immature Granulocytes 0.02 0.00 - 0.07 K/uL    Comment: Performed at [facility], [place] [address], [place], [place] [address]  Comprehensive metabolic panel     Status: Abnormal   Collection Time: 11/02/21  8:55 PM  Result Value Ref Range   Sodium 137 135 - 145 mmol/L   Potassium 4.1 3.5 - 5.1 mmol/L   Chloride 107 98 - 111 mmol/L   CO2 19 (L) 22 - 32 mmol/L   Glucose, Bld 249 (H) 70 - 99 mg/dL    Comment: Glucose reference range applies only to samples taken after fasting for at least 8 hours.   BUN 28 (H) 8 - 23 mg/dL   Creatinine, Ser 1.41 (H) 0.44 - 1.00 mg/dL   Calcium 9.0 8.9 - 10.3 mg/dL   Total Protein 6.3 (L) 6.5 - 8.1 g/dL   Albumin 3.7 3.5 - 5.0 g/dL   AST 22 15 - 41 U/L   ALT 22 0 - 44 U/L   Alkaline Phosphatase 36 (L) 38 - 126 U/L   Total Bilirubin 0.7 0.3 - 1.2 mg/dL   GFR, Estimated 34 (L) >60 mL/min    Comment: (NOTE) Calculated using the CKD-EPI Creatinine Equation (2021)    Anion gap 11 5 - 15    Comment: Performed at [facility], [place] [address] [place], [place] [address]  CBG monitoring, ED     Status: Abnormal   Collection Time: 11/03/21  1:59 AM  Result Value Ref Range   Glucose-Capillary 166 (H) 70 - 99 mg/dL    Comment: Glucose reference range applies only to samples taken after fasting for at least 8 hours.  Hemoglobin A1c     Status: Abnormal   Collection Time: 11/03/21  4:45 AM  Result Value Ref Range   Hgb A1c MFr Bld 7.8 (H) 4.8 - 5.6 %    Comment: (NOTE) Pre diabetes:          5.7%-6.4%  Diabetes:              >6.4%  Glycemic control for   <7.0% adults with diabetes    Mean Plasma Glucose 177.16 mg/dL    Comment: Performed at [place] [address] [place],  [address]  CBC     Status: Abnormal   Collection Time: 11/03/21  4:45 AM  Result Value Ref Range   WBC 7.3 4.0 - 10.5 K/uL   RBC 3.11 (L) 3.87 - 5.11 MIL/uL   Hemoglobin 9.8 (L) 12.0 - 15.0 g/dL   HCT 30.3 (L) 36.0 - 46.0 %   MCV 97.4 80.0 - 100.0 fL   MCH 31.5 26.0 - 34.0 pg   MCHC 32.3 30.0 - 36.0 g/dL   RDW  13.7 11.5 - 15.5 %   Platelets 161 150 - 400 K/uL   nRBC 0.0 0.0 - 0.2 %    Comment: Performed at [facility], [place] [address] [place], [place] [address]  Basic metabolic panel     Status: Abnormal   Collection Time: 11/03/21  4:45 AM  Result Value Ref Range   Sodium 137 135 - 145 mmol/L   Potassium 4.0 3.5 - 5.1 mmol/L   Chloride 108 98 - 111 mmol/L   CO2 23 22 - 32 mmol/L   Glucose, Bld 142 (H) 70 - 99 mg/dL    Comment: Glucose reference range applies only to samples taken after fasting for at least 8 hours.   BUN 31 (H) 8 - 23 mg/dL   Creatinine, Ser 1.23 (H) 0.44 - 1.00 mg/dL   Calcium 8.7 (L) 8.9 - 10.3 mg/dL   GFR, Estimated 40 (L) >60 mL/min    Comment: (NOTE) Calculated using the CKD-EPI Creatinine Equation (2021)    Anion gap 6 5 - 15    Comment: Performed at [facility], [place] [address], [place], [place] [address]  Magnesium     Status: Abnormal   Collection Time: 11/03/21  4:45 AM  Result Value Ref Range   Magnesium 1.6 (L) 1.7 - 2.4 mg/dL    Comment: Performed at [facility], [place] [address], [place], [place] [address]  Phosphorus     Status: None   Collection Time: 11/03/21  4:45 AM  Result Value Ref Range   Phosphorus 3.8 2.5 - 4.6 mg/dL    Comment: Performed at [facility], [place] [address] [place], [place] [address]  CBG monitoring, ED     Status: Abnormal   Collection Time: 11/03/21  7:18 AM  Result Value Ref Range   Glucose-Capillary 131 (H) 70 - 99 mg/dL    Comment: Glucose reference range applies only to samples taken after fasting for at least 8 hours.  CBG monitoring, ED     Status: Abnormal   Collection Time: 11/03/21 11:52 AM  Result Value Ref Range   Glucose-Capillary 153 (H) 70 - 99 mg/dL    Comment: Glucose reference range applies only to samples taken after fasting for at least 8 hours.  Glucose, capillary     Status: Abnormal   Collection Time: 11/03/21  5:10 PM   Result Value Ref Range   Glucose-Capillary 131 (H) 70 - 99 mg/dL    Comment: Glucose reference range applies only to samples taken after fasting for at least 8 hours.  Glucose, capillary     Status: Abnormal   Collection Time: 11/03/21  9:14 PM  Result Value Ref Range   Glucose-Capillary 242 (H) 70 - 99 mg/dL    Comment: Glucose reference range applies only to samples taken after fasting for at least 8 hours.  Magnesium     Status: None   Collection Time: 11/04/21  3:30 AM  Result Value Ref Range   Magnesium 2.1 1.7 -  2.4 mg/dL    Comment: Performed at [facility], [place] [address], [place], [place] [address]  CBC     Status: Abnormal   Collection Time: 11/04/21  3:30 AM  Result Value Ref Range   WBC 6.0 4.0 - 10.5 K/uL   RBC 2.76 (L) 3.87 - 5.11 MIL/uL   Hemoglobin 8.7 (L) 12.0 - 15.0 g/dL   HCT 27.0 (L) 36.0 - 46.0 %   MCV 97.8 80.0 - 100.0 fL   MCH 31.5 26.0 - 34.0 pg   MCHC 32.2 30.0 - 36.0 g/dL   RDW 13.8 11.5 - 15.5 %   Platelets 153 150 - 400 K/uL   nRBC 0.0 0.0 - 0.2 %    Comment: Performed at [facility], [place] [address], [place], [place] [address]  Glucose, capillary     Status: Abnormal   Collection Time: 11/04/21  7:42 AM  Result Value Ref Range   Glucose-Capillary 134 (H) 70 - 99 mg/dL    Comment: Glucose reference range applies only to samples taken after fasting for at least 8 hours.  Glucose, capillary     Status: Abnormal   Collection Time: 11/04/21 11:48 AM  Result Value Ref Range   Glucose-Capillary 160 (H) 70 - 99 mg/dL    Comment: Glucose reference range applies only to samples taken after fasting for at least 8 hours.  Glucose, capillary     Status: Abnormal   Collection Time: 11/04/21  5:29 PM  Result Value Ref Range   Glucose-Capillary 114 (H) 70 - 99 mg/dL    Comment: Glucose reference range applies only to samples taken after fasting for at least 8 hours.    DG HIP UNILAT WITH PELVIS 2-3 VIEWS LEFT  Result  Date: 11/04/2021 CLINICAL DATA:  Fall 2 days ago. No left shoulder fracture. Left hip pain. EXAM: DG HIP (WITH OR WITHOUT PELVIS) 2-3V LEFT COMPARISON:  [facility] 06/26/2021, CT abdomen and pelvis 06/24/2021. FINDINGS: Diffuse decreased bone mineralization. Old healed fractures of the bilateral superior and inferior pubic rami. Moderate bilateral femoroacetabular joint space narrowing. The bilateral sacroiliac joint spaces are maintained. No acute fracture is seen. No dislocation. Moderate dextrocurvature of the mid lumbar spine. A vascular phlebolith overlies the left hemipelvis. IMPRESSION: Within the limitations of diffuse decreased bone mineralization, no acute fracture is seen. There are old healed bilateral superior and inferior pubic ramus fractures. Electronically Signed   By: Yvonne Kendall M.D.   On: 11/04/2021 18:03   CT Shoulder Left Wo Contrast  Result Date: 11/03/2021 CLINICAL DATA:  Fall with proximal left humeral fracture. EXAM: CT OF THE UPPER LEFT EXTREMITY WITHOUT CONTRAST TECHNIQUE: Multidetector CT imaging of the upper left extremity was performed according to the standard protocol. RADIATION DOSE REDUCTION: This exam was performed according to the departmental dose-optimization program which includes automated exposure control, adjustment of the mA and/or kV according to patient size and/or use of iterative reconstruction technique. COMPARISON:  Right shoulder series earlier today. FINDINGS: Bones/Joint/Cartilage Generalized osteopenia. Again noted is an acute transverse surgical neck fracture of the proximal left humerus. The distal fragment is impacted and cephalad displaced up to 2 cm with respect to the proximal fragment, displaced anteriorly up to an entire shaft width, displaced laterally by about 1/2 of a shaft's width, with mild posteromedial angulation. There are small comminution fragments along side the fracture margins. The left clavicle, sternoclavicular and AC joints, and visualized  portion of the scapula are intact. The visualized left ribs do  not show evidence of a displaced fracture. There is advanced degenerative change of the visualized cervical spine. Evaluation of the glenohumeral joint demonstrates labral chondrocalcinosis and mild periarticular spurring changes, but only slight joint space loss. There are periarticular spurs and calcified pannus involving the [facility] joint and sternoclavicular joint. There is a small glenohumeral joint effusion with scattered calcific debris in the joint space. Ligaments Suboptimally assessed by CT. Muscles and Tendons There are calcifications within the rotator cuff tendons, relatively mild narrowing is seen along the acromial humeral space and only mild atrophy in the supraspinatus muscle belly with other rotator cuff muscles showing normal bulk. No acute tendon rupture is seen. The proximal end of the distal fracture fragment protrudes up into the anterior aspect of the deltoid muscle with swelling and soft tissue fullness in the anterior left deltoid. There is no other focal muscular acute abnormality. Soft tissues There is moderate subcutaneous stranding in the anterior left shoulder, likely ecchymosis. No space-occupying hematoma is seen. There is atherosclerotic disease in the aortic arch. IMPRESSION: 1. Acute surgical neck proximal left humeral fracture with mild posteromedial angulation, anterior and lateral displacement of the distal fragment, and cephalad displacement with the proximal margin of the distal fragment having extended into the anterior deltoid muscle with swelling in the muscle and overlying ecchymosis. 2. Osteopenia, degenerative changes, and chondrocalcinosis, the latter consistent with CPPD. 3. Scattered calcific debris in the glenohumeral joint space, calcified pannus in the [facility] joint and sternoclavicular joint. 4. Rotator cuff tendon calcifications, could be related CPPD and/or calcific tendinopathy. 5. Aortic atherosclerosis.  Electronically Signed   By: Telford Nab M.D.   On: 11/03/2021 00:25   CT Cervical Spine Wo Contrast  Result Date: 11/02/2021 CLINICAL DATA:  Status post fall. EXAM: CT CERVICAL SPINE WITHOUT CONTRAST TECHNIQUE: Multidetector CT imaging of the cervical spine was performed without intravenous contrast. Multiplanar CT image reconstructions were also generated. RADIATION DOSE REDUCTION: This exam was performed according to the departmental dose-optimization program which includes automated exposure control, adjustment of the mA and/or kV according to patient size and/or use of iterative reconstruction technique. COMPARISON:  None Available. FINDINGS: Alignment: There is approximately 3 mm anterolisthesis of the C3 vertebral body on C4. Reversal of the normal cervical spine lordosis is also noted. Skull base and vertebrae: No acute fracture. Chronic and degenerative changes are seen involving the body and tip of the dens, as well as the adjacent portion of the anterior arch of C1. Soft tissues and spinal canal: No prevertebral fluid or swelling. No visible canal hematoma. Disc levels: Marked severity endplate sclerosis is seen at the levels of C5-C6 and C6-C7, with moderate severity endplate sclerosis noted at C3-C4 and C4-C5. There is marked severity narrowing of the anterior atlantoaxial articulation. Marked severity intervertebral disc space narrowing is seen at C3-C4, C4-C5, C5-C6 and C6-C7. Marked severity bilateral facet joint hypertrophy is seen at the level of C3-C4, with marked severity left-sided facet joint hypertrophy noted at C2-C3. Moderate severity bilateral facet joint hypertrophy is noted throughout the remainder of the cervical spine. Upper chest: Negative. Other: None. IMPRESSION: 1. No acute fracture or traumatic malalignment of the cervical spine. 2. Marked severity multilevel degenerative changes, as described above. 3. 3 mm anterolisthesis of the C3 vertebral body on C4. 4. Reversal of the  normal cervical spine lordosis, which may be secondary to positioning or muscle spasm. Electronically Signed   By: Virgina Norfolk M.D.   On: 11/02/2021 23:59   CT Head Wo Contrast  Result Date: 11/02/2021 CLINICAL DATA:  Status post fall. EXAM: CT HEAD WITHOUT CONTRAST TECHNIQUE: Contiguous axial images were obtained from the base of the skull through the vertex without intravenous contrast. RADIATION DOSE REDUCTION: This exam was performed according to the departmental dose-optimization program which includes automated exposure control, adjustment of the mA and/or kV according to patient size and/or use of iterative reconstruction technique. COMPARISON:  None Available. FINDINGS: Brain: There is moderate severity cerebral atrophy with widening of the extra-axial spaces and ventricular dilatation. There are areas of decreased attenuation within the white matter tracts of the supratentorial brain, consistent with microvascular disease changes. Vascular: No hyperdense vessel or unexpected calcification. Skull: Normal. Negative for fracture or focal lesion. Sinuses/Orbits: No acute finding. Other: None. IMPRESSION: 1. No acute intracranial abnormality. 2. Generalized cerebral atrophy and microvascular disease changes of the supratentorial brain. Electronically Signed   By: Virgina Norfolk M.D.   On: 11/02/2021 23:56   DG Shoulder Left  Result Date: 11/02/2021 CLINICAL DATA:  Fall. EXAM: LEFT SHOULDER - 2+ VIEW COMPARISON:  None Available. FINDINGS: There is an acute mildly comminuted fracture through the left humeral neck. There is 1 shaft with lateral displacement of the distal fracture fragment. There is apex lateral angulation. No dislocation. There are mild degenerative changes of the acromioclavicular joint. There are multiple healed left-sided rib fractures. IMPRESSION: 1. Acute displaced left humeral neck fracture. Electronically Signed   By: Ronney Asters M.D.   On: 11/02/2021 21:54      Vitals Temp:  [98 F (36.7 C)-98.9 F (37.2 C)] 98.9 F (37.2 C) (08/29 0930) Pulse Rate:  [85-106] 106 (08/29 0930) Resp:  [16-17] 16 (08/29 0930) BP: (107-125)/(59-81) 107/64 (08/29 0930) SpO2:  [96 %-98 %] 98 % (08/29 0930) Body mass index is 25.96 kg/m.  Physical Exam: Patient is a petite, slender, elderly female who is alert and oriented.  Orthopedic complaints limited to her left shoulder.  On inspection there is broad diffuse swelling as well as resolving ecchymosis noted about the shoulder extending to the upper arm.  Her skin is intact but there is significant bony prominence anteriorly beneath the deltoid where the proximal humeral segment has been impacted into the muscular tissues.  She is intact light touch sensation in the axillary nerve distribution and is neurovascular intact distally in left upper extremity.  She demonstrates good forearm supination and pronation with no specific complaints of elbow pain.  Radiographs  Review of recent left shoulder plain films demonstrate a severely displaced 2 part proximal humerus fracture.  The humeral shaft segment is severely displaced approximately and is embedded within the anterior deltoid creating the notable prominence seen on physical exam.     Assessment/Plan: Impression: Severely displaced left 2 part proximal humerus fracture with the humeral shaft impaled within the anterior deltoid musculature.  Plan  I have counseled the patient regarding her clinical findings as well as the results of her recent x-rays.  The degree of displacement and the fact that the humerus is impaled within her deltoid musculature precludes any successful healing with conservative management.  If her shoulder is left as it is then that would condemn her to a flail arm.  We discussed the possibility of open reduction and internal fixation which in this particular situation given her vitality and level of independence I feel would be the better  option.  We have reviewed the potential surgical risks which include bleeding, infection, neurovascular injury, malunion, nonunion, loss of fixation, and possible need for additional  surgery.  At this time she would like to further consider options and would like to discuss these issues with her son.  Additionally, I would like for the hospitalist service to weigh in as to whether she would be a reasonable surgical candidate.  We have tentatively set aside time Thursday afternoon for surgery should she be agreeable and her overall health status be appropriate for surgery.  We will follow-up tomorrow to review options and make definitive treatment plans based on her wishes.    Jie Stickels M Mykaila Blunck 11/04/2021, 7:00 PM  Contact # [phone]

## 2021-11-04 NOTE — Assessment & Plan Note (Addendum)
A1c 7.8%. Glucose normal here - resume jardiance at discharge

## 2021-11-04 NOTE — Assessment & Plan Note (Addendum)
-   no prior lipid panel noted - has been on crestor 10 mg outpatient as of recently - given age and risk/benefits, not sure the mortality benefit of continuing statin; also discussed with her son  - would consider discontinuation of statin at outpatient follow up

## 2021-11-04 NOTE — TOC Initial Note (Signed)
Transition of Care [facility]) - Initial/Assessment Note   Patient Details  Name: Cheryl Salas MRN: [id number] Date of Birth: Nov 07, 1923  Transition of Care [facility]) CM/SW Contact:    Sherie Don, LCSW Phone Number: 11/04/2021, 10:45 AM  Clinical Narrative: PT evaluation recommended SNF. CSW spoke with patient's son, Cheryl Salas, as patient was sleeping. Family is requesting [facility] as first choice. FL2 done; PASRR received. Initial referral faxed out. TOC awaiting bed offers.  Expected Discharge Plan: [place] Barriers to Discharge: Continued Medical Work up, SNF Pending bed offer  Patient Goals and CMS Choice Patient states their goals for this hospitalization and ongoing recovery are:: Go to short-term rehab at [email or web address] Compare Post Acute Care list provided to:: Patient Represenative (must comment) Choice offered to / list presented to : Adult Children  Expected Discharge Plan and Services Expected Discharge Plan: [place] In-house Referral: Clinical Social Work Post Acute Care Choice: [place] Living arrangements for the past 2 months: [place]            DME Arranged: N/A DME Agency: NA  Prior Living Arrangements/Services Living arrangements for the past 2 months: Single Family Home Patient language and need for interpreter reviewed:: Yes Do you feel safe going back to the place where you live?: Yes      Need for Family Participation in Patient Care: Yes (Comment) Care giver support system in place?: Yes (comment) Criminal Activity/Legal Involvement Pertinent to Current Situation/Hospitalization: No - Comment as needed  Activities of Daily Living Home Assistive Devices/Equipment: Dentures (specify type) (full set dentures) ADL Screening (condition at time of admission) Patient's cognitive ability adequate to safely complete daily activities?: Yes Is the patient deaf or have difficulty hearing?: Yes  (slightly hoh) Does the patient have difficulty seeing, even when wearing glasses/contacts?: No Does the patient have difficulty concentrating, remembering, or making decisions?: No Patient able to express need for assistance with ADLs?: Yes Does the patient have difficulty dressing or bathing?: Yes Independently performs ADLs?: No Communication: Independent Dressing (OT): Needs assistance Is this a change from baseline?: Change from baseline, expected to last >3 days Grooming: Independent Feeding: Independent Bathing: Needs assistance Is this a change from baseline?: Change from baseline, expected to last >3 days Toileting: Needs assistance Is this a change from baseline?: Change from baseline, expected to last >3days In/Out Bed: Needs assistance Is this a change from baseline?: Change from baseline, expected to last >3 days Walks in Home: Needs assistance Is this a change from baseline?: Change from baseline, expected to last >3 days Does the patient have difficulty walking or climbing stairs?: Yes Weakness of Legs: None Weakness of Arms/Hands: Right  Permission Sought/Granted Permission sought to share information with : Chartered certified accountant granted to share information with : Yes, Verbal Permission Granted Permission granted to share info w AGENCY: SNFs  Emotional Assessment Appearance:: Appears stated age Orientation: : Oriented to Self, Oriented to Place, Oriented to  Time, Oriented to Situation Alcohol / Substance Use: Not Applicable Psych Involvement: No (comment)  Admission diagnosis:  Humeral fracture [S42.309A] Hyperglycemia [R73.9] Closed 2-part displaced fracture of surgical neck of left humerus, initial encounter [S42.222A] Fall, initial encounter [W19.XXXA] Patient Active Problem List   Diagnosis Date Noted   Humeral fracture 11/03/2021   Hypomagnesemia 11/03/2021   SOB (shortness of breath) 06/25/2021   Type 2 diabetes mellitus with  complication, without long-term current use of insulin ([place]) 06/24/2021   Iron deficiency anemia 06/24/2021  Essential hypertension 06/24/2021   Hyperlipidemia 06/24/2021   Pubic ramus fracture ([place]) 05/18/2018   Type 2 diabetes mellitus with stage 3 chronic kidney disease, without long-term current use of insulin ([place]) 12/13/2017   Bilateral hearing loss due to cerumen impaction 12/13/2017   Loose stools 12/13/2017   Ileostomy status ([place]) 12/13/2017   Diverticulosis 12/13/2017   Stage 3b chronic kidney disease (CKD) ([place]) 12/13/2017   Senile osteoporosis 12/13/2017   Absolute anemia 12/13/2017   Localized edema 12/13/2017   Sinus tachycardia 12/13/2017   History of recurrent UTIs 12/13/2017   History of breast cancer 12/13/2017   History of bladder cancer 12/13/2017   PCP:  Gayland Curry, DO Pharmacy:   [facility] [place], [place] - [address] [place] [address] Phone: [phone] Fax: [phone]  Readmission Risk Interventions     No data to display

## 2021-11-05 DIAGNOSIS — S42212A Unspecified displaced fracture of surgical neck of left humerus, initial encounter for closed fracture: Secondary | ICD-10-CM | POA: Diagnosis not present

## 2021-11-05 DIAGNOSIS — I440 Atrioventricular block, first degree: Secondary | ICD-10-CM

## 2021-11-05 LAB — GLUCOSE, CAPILLARY
Glucose-Capillary: 191 mg/dL — ABNORMAL HIGH (ref 70–99)
Glucose-Capillary: 201 mg/dL — ABNORMAL HIGH (ref 70–99)
Glucose-Capillary: 169 mg/dL — ABNORMAL HIGH (ref 70–99)
Glucose-Capillary: 305 mg/dL — ABNORMAL HIGH (ref 70–99)

## 2021-11-05 LAB — SURGICAL PCR SCREEN
MRSA, PCR: NEGATIVE
Staphylococcus aureus: NEGATIVE

## 2021-11-05 LAB — TSH: TSH: 0.657 u[IU]/mL (ref 0.350–4.500)

## 2021-11-05 MED ORDER — MORPHINE SULFATE (PF) 2 MG/ML IV SOLN
1.0000 mg | INTRAVENOUS | Status: DC | PRN
  Administered 2021-11-05 (×2): 1 mg via INTRAVENOUS
  Administered 2021-11-06: 2 mg via INTRAVENOUS
  Filled 2021-11-05 (×3): qty 1

## 2021-11-05 MED ORDER — OYSTER SHELL CALCIUM/D3 500-5 MG-MCG PO TABS
1.0000 | ORAL_TABLET | Freq: Two times a day (BID) | ORAL | Status: DC
  Administered 2021-11-05 – 2021-11-10 (×10): 1 via ORAL
  Filled 2021-11-05 (×10): qty 1

## 2021-11-05 NOTE — Plan of Care (Signed)
  Problem: Education: Goal: Ability to describe self-care measures that may prevent or decrease complications (Diabetes Survival Skills Education) will improve Outcome: Progressing   Problem: Coping: Goal: Ability to adjust to condition or change in health will improve Outcome: Progressing   Problem: Fluid Volume: Goal: Ability to maintain a balanced intake and output will improve Outcome: Progressing   Problem: Health Behavior/Discharge Planning: Goal: Ability to identify and utilize available resources and services will improve Outcome: Progressing   Problem: Nutritional: Goal: Maintenance of adequate nutrition will improve Outcome: Progressing   Problem: Skin Integrity: Goal: Risk for impaired skin integrity will decrease Outcome: Progressing   Problem: Tissue Perfusion: Goal: Adequacy of tissue perfusion will improve Outcome: Progressing   

## 2021-11-05 NOTE — Progress Notes (Signed)
Physical Therapy Treatment Patient Details Name: Cheryl Salas MRN: [id number] DOB: 1923/07/03 Today's Date: 11/05/2021   History of Present Illness Cheryl Salas is a 88 y.o. female with medical history significant for essential hypertension, type 2 diabetes, hyperlipidemia, CKD 3B, prior diverticulitis status post ileostomy, hysterectomy/bladder suspension, history of bladder/breast cancer with bilateral mastectomy, ambulatory dysfunction with use of a walker, who presented to [facility] ED from home with complaints of left shoulder pain after a mechanical fall on 11/02/21 and sustained left displaced  humeral neck fracture, conservative management with sling    PT Comments    General Comments: AxO x 3 very pleasant Lady following all commands.  Stated she has had her colostomy about four years. Pt already OOB in recliner.  Assisted with Colostomy empty due to fullness.  Assisted to [facility] to void.  Asis ed with peri care as pt was self unable due to impaired balance and only use of one hand.  L UE in sling.  Assisted with amb., General Gait Details: Max hand held assist on pt's RIGHT with L UE sling, pt was able to amb 12 feet but with difficulty due to instabiliy.  HIGH FALL RISK. Pt plans to have surgery tomorrow. Pt will need ST Rehab at SNF to address mobility and functional decline prior to safely returning home.   Recommendations for follow up therapy are one component of a multi-disciplinary discharge planning process, led by the attending physician.  Recommendations may be updated based on patient status, additional functional criteria and insurance authorization.  Follow Up Recommendations  Skilled nursing-short term rehab (<3 hours/day) Can patient physically be transported by private vehicle: No   Assistance Recommended at Discharge Frequent or constant Supervision/Assistance  Patient can return home with the following Two people to help with walking and/or transfers;A lot of help with  bathing/dressing/bathroom;Assistance with cooking/housework;Assist for transportation;Help with stairs or ramp for entrance   Equipment Recommendations  None recommended by PT    Recommendations for Other Services       Precautions / Restrictions Precautions Precautions: Fall Precaution Comments: R arm sling, ostomy Required Braces or Orthoses: Sling Restrictions Weight Bearing Restrictions: Yes RUE Weight Bearing: Non weight bearing LUE Weight Bearing: Non weight bearing     Mobility  Bed Mobility               General bed mobility comments: OOB in recliner    Transfers Overall transfer level: Needs assistance Equipment used: None Transfers: Bed to chair/wheelchair/BSC Sit to Stand: Mod assist           General transfer comment: Mod Assist + 1 from recliner to [facility] with assist for balance.  Then from [facility] back to recliner.  Then from recliner to standing to amb.  Very unsteady.  Required rest breaks between.    Ambulation/Gait Ambulation/Gait assistance: Max assist Gait Distance (Feet): 12 Feet Assistive device: None, 1 person hand held assist Gait Pattern/deviations: Step-to pattern, Decreased step length - right, Decreased step length - left, Narrow base of support, Trunk flexed Gait velocity: decreased     General Gait Details: Max hand held assist on pt's RIGHT with L UE sling, pt was able to amb 12 feet but with difficulty due to instabiliy.  HIGH FALL RISK.   Stairs             Wheelchair Mobility    Modified Rankin (Stroke Patients Only)       Balance  Cognition Arousal/Alertness: Awake/alert Behavior During Therapy: WFL for tasks assessed/performed Overall Cognitive Status: Within Functional Limits for tasks assessed                                 General Comments: AxO x 3 very pleasant Lady following all commands.  Stated she has had her colostomy about  four years.        Exercises      General Comments        Pertinent Vitals/Pain Pain Assessment Pain Assessment: Faces Faces Pain Scale: Hurts even more Pain Location: LUE shoulder Pain Descriptors / Indicators: Discomfort, Guarding Pain Intervention(s): Monitored during session, Patient requesting pain meds-RN notified, Ice applied    Home Living                          Prior Function            PT Goals (current goals can now be found in the care plan section) Progress towards PT goals: Progressing toward goals    Frequency    Min 2X/week      PT Plan Current plan remains appropriate    Co-evaluation              AM-PAC PT "6 Clicks" Mobility   Outcome Measure  Help needed turning from your back to your side while in a flat bed without using bedrails?: A Lot Help needed moving from lying on your back to sitting on the side of a flat bed without using bedrails?: A Lot Help needed moving to and from a bed to a chair (including a wheelchair)?: A Lot Help needed standing up from a chair using your arms (e.g., wheelchair or bedside chair)?: A Lot Help needed to walk in hospital room?: A Lot Help needed climbing 3-5 steps with a railing? : Total 6 Click Score: 11    End of Session Equipment Utilized During Treatment: Gait belt Activity Tolerance: Patient limited by pain;Patient limited by fatigue Patient left: with call bell/phone within reach;in chair;with chair alarm set Nurse Communication: Mobility status PT Visit Diagnosis: Unsteadiness on feet (R26.81);Muscle weakness (generalized) (M62.81);History of falling (Z91.81);Pain Pain - Right/Left: Left Pain - part of body: Arm;Shoulder     Time: 4650-3546 PT Time Calculation (min) (ACUTE ONLY): 25 min  Charges:  $Gait Training: 8-22 mins $Therapeutic Activity: 8-22 mins                     Rica Koyanagi  PTA Acute  Rehabilitation Services Office M-F          [phone] Weekend  pager [phone]

## 2021-11-05 NOTE — Plan of Care (Signed)
  Problem: Pain Managment: Goal: General experience of comfort will improve 11/05/2021 0224 by Blase Mess, RN Outcome: Progressing 11/05/2021 0223 by Blase Mess, RN Outcome: Progressing

## 2021-11-05 NOTE — Plan of Care (Signed)
  Problem: Coping: Goal: Ability to adjust to condition or change in health will improve Outcome: Progressing   Problem: Coping: Goal: Level of anxiety will decrease Outcome: Progressing   Problem: Pain Managment: Goal: General experience of comfort will improve Outcome: Progressing   

## 2021-11-05 NOTE — Progress Notes (Signed)
Progress Note    Cheryl Salas   VEL:[id number]  DOB: 1923/03/25  DOA: 11/02/2021     2 PCP: Gayland Curry, DO  Initial CC: fall at home  Hospital Course: Cheryl Salas is a 88 y.o. F with PMH DMII, CKD IIIb, hx diverticulitis s/p ileostomy, and hx bladder/breast cancer with bilateral mastectomy wh presented with left arm pain after a mechanical fall.    Workup was notable for a proximal left humeral fracture with angulation and lateral displacement.   Interval History:  No events overnight. Patient is amenable with surgery. I was unable to reach son this am but will call him back again later today.  Patient understands her slightly increased risk regarding surgery but is still wishing for repair given that the alternative would be losing use of her left arm.   Assessment and Plan: * Left humeral fracture - s/p mechanical fall; uses a walker at baseline but does have rather high functional baseline - CT shows rather intricate fracture and given her independence at baseline, I agree that if a surgical approach is feasible she does deserve the attempt - from a clearance standpoint she is technically above average but certainly not high risk for surgery; her echo from March 2023 shows a preserved EF with no wall motion abnormalities and only mild LVH not unexpected at her age from hx of HTN. I repeated her EKG on 8/30 and she has very mild 1st degree AV block which is chronic and unchanged (and she's certainly asymptomatic from this standpoint) - her highest risk postop will be likely in need of a SNF which she also understands and accepts as well - patient and son (per staff) are now amenable for surgery as well; I tried calling son this morning but no answer and will call later today to discuss with him again her slightly increased risk in general (mostly due to age), but as noted, given her high mobility (Elderly mobility scale score ~15 which is consistent with independent with ADLs) and the  benefit of surgical repair rather than losing functionality of the LUE, I would also agree with proceeding with surgery as well - RCRI score also 0 points (Class 1 risk, 3.9% risk of 30 day cardiac event)  Ileostomy status ([place]) - ostomy in place; patient able to manage at home independently - likely in postop phase she will need assistance with this and SNF would be appropriate   Left hip pain - Seems over the trochanter.  No pain with ROM of the left hip.  No bruising to suggest hematoma. - xray obtained on 8/29 shows old healed B/L superior and inferior pubic rami fractures; diffuse decreased bone mineralization noted  - will start her on Ca + Vit D as well   Iron deficiency anemia - Hgb slightly down; likely from large bruising on left shoulder - baseline ~10 - 11 g/dL - monitor Hgb post op  Type 2 diabetes mellitus with complication, without long-term current use of insulin (HCC) A1c 7.8%. Glucose normal here - Continue SS corrections - Hold Jardiance  Stage 3b chronic kidney disease (CKD) (HCC) Baseline Cr 1.2-1.6, stable  1st degree AV block - very mildly increased PR interval; old EKGs reviewed and present as well - patient asymptomatic  - no further workup at this time - prior echo reviewed from March 2023 as well; preserved EF and mild LVH; no RWMA  Hypomagnesemia - Supplement Mag  Hyperlipidemia - Continue Crestor  Essential hypertension, ruled out - Patient  confirms she has no diagnosis of HTN.  Her hydralazine was a new medicine? which may be unneeded - nevertheless, okay to monitor vitals off BP meds at this time   Old records reviewed in assessment of this patient  Antimicrobials:   DVT prophylaxis:  enoxaparin (LOVENOX) injection 30 mg Start: 11/03/21 1000   Code Status:   Code Status: Full Code  Mobility Assessment (last 72 hours)     Mobility Assessment     Row Name 11/05/21 [phone] 11/05/21 0807 11/05/21 0100 11/04/21 0836 11/03/21 1915   Does  patient have an order for bedrest or is patient medically unstable No - Continue assessment No - Continue assessment No - Continue assessment No - Continue assessment No - Continue assessment   What is the highest level of mobility based on the progressive mobility assessment? Level 5 (Walks with assist in room/hall) - Balance while stepping forward/back and can walk in room with assist - Complete Level 5 (Walks with assist in room/hall) - Balance while stepping forward/back and can walk in room with assist - Complete Level 5 (Walks with assist in room/hall) - Balance while stepping forward/back and can walk in room with assist - Complete Level 5 (Walks with assist in room/hall) - Balance while stepping forward/back and can walk in room with assist - Complete Level 5 (Walks with assist in room/hall) - Balance while stepping forward/back and can walk in room with assist - Complete    Row Name 11/03/21 1706 11/03/21 0950 11/03/21 0946       Does patient have an order for bedrest or is patient medically unstable No - Continue assessment -- --     What is the highest level of mobility based on the progressive mobility assessment? Level 5 (Walks with assist in room/hall) - Balance while stepping forward/back and can walk in room with assist - Complete Level 3 (Stands with assist) - Balance while standing  and cannot march in place Level 3 (Stands with assist) - Balance while standing  and cannot march in place              Barriers to discharge:  Disposition Plan:  SNF Status is: Inpt  Objective: Blood pressure 136/82, pulse 89, temperature 98.7 F (37.1 C), temperature source Oral, resp. rate 16, height '4\' 11"'$  (1.499 m), weight 58.3 kg, SpO2 100 %.  Examination:  Physical Exam Constitutional:      General: She is not in acute distress.    Appearance: Normal appearance. She is not ill-appearing.  HENT:     Head: Normocephalic and atraumatic.     Mouth/Throat:     Mouth: Mucous membranes are  moist.  Eyes:     Extraocular Movements: Extraocular movements intact.  Cardiovascular:     Rate and Rhythm: Normal rate and regular rhythm.  Pulmonary:     Effort: Pulmonary effort is normal. No respiratory distress.     Breath sounds: Normal breath sounds.  Abdominal:     General: Bowel sounds are normal. There is no distension.     Palpations: Abdomen is soft.     Tenderness: There is no abdominal tenderness.  Musculoskeletal:     Cervical back: Normal range of motion and neck supple.     Comments: LUE in sling; shoulder noted with bruising. Does have hand grip strength in left hand  Neurological:     Mental Status: She is alert.     Comments: Sensation intact in LUE  Psychiatric:  Mood and Affect: Mood normal.      Consultants:  Orthopedic surgery  Procedures:    Data Reviewed: Results for orders placed or performed during the hospital encounter of 11/02/21 (from the past 24 hour(s))  Glucose, capillary     Status: Abnormal   Collection Time: 11/04/21 11:48 AM  Result Value Ref Range   Glucose-Capillary 160 (H) 70 - 99 mg/dL  Glucose, capillary     Status: Abnormal   Collection Time: 11/04/21  5:29 PM  Result Value Ref Range   Glucose-Capillary 114 (H) 70 - 99 mg/dL  Glucose, capillary     Status: Abnormal   Collection Time: 11/04/21  9:44 PM  Result Value Ref Range   Glucose-Capillary 275 (H) 70 - 99 mg/dL  Glucose, capillary     Status: Abnormal   Collection Time: 11/05/21  7:40 AM  Result Value Ref Range   Glucose-Capillary 191 (H) 70 - 99 mg/dL    I have Reviewed nursing notes, Vitals, and Lab results since pt's last encounter. Pertinent lab results : see above I have ordered test including BMP, CBC, Mg I have reviewed the last note from staff over past 24 hours I have discussed pt's care plan and test results with nursing staff, case manager   LOS: 2 days   Dwyane Dee, MD [facility] 11/05/2021, 11:22 AM

## 2021-11-05 NOTE — TOC Progression Note (Signed)
Transition of Care [facility]) - Progression Note   Patient Details  Name: Cheryl Salas MRN: [id number] Date of Birth: [birth year]  [facility]) CM/SW [place], LCSW Phone Number: 11/05/2021, 1:21 PM  Clinical Narrative: CSW met with patient and son to discuss bed offers and they confirmed [facility] is still their first choice. Patient will be getting surgery tomorrow so patient is expected to be medically ready for discharge on Friday (11/07/21) if cleared by medical and orthopedics. CSW confirmed bed with [place] at [facility] and that patient can be admitted later this week as the bed will be held. No insurance authorization is needed. TOC to follow.  Expected Discharge Plan: [place] Barriers to Discharge: Continued Medical Work up, SNF Pending bed offer  Expected Discharge Plan and Services Expected Discharge Plan: [place] In-house Referral: Clinical Social Work Post Acute Care Choice: [place] Living arrangements for the past 2 months: Single Family Home           DME Arranged: N/A DME Agency: NA  Readmission Risk Interventions     No data to display

## 2021-11-05 NOTE — Progress Notes (Signed)
Patient asleep after receiving tylenol for pain in left shoulder, sling in place and left arm supported with pillow, ice pack to left upper arm, contacted on-call for orders for additional options for pain control, see mar, will continue to monitor.

## 2021-11-05 NOTE — Assessment & Plan Note (Signed)
-   very mildly increased PR interval; old EKGs reviewed and present as well - patient asymptomatic  - no further workup at this time - prior echo reviewed from March 2023 as well; preserved EF and mild LVH; no RWMA

## 2021-11-06 ENCOUNTER — Inpatient Hospital Stay (HOSPITAL_COMMUNITY): Payer: Medicare Other | Admitting: Certified Registered"

## 2021-11-06 ENCOUNTER — Inpatient Hospital Stay (HOSPITAL_COMMUNITY): Payer: Medicare Other

## 2021-11-06 ENCOUNTER — Encounter (HOSPITAL_COMMUNITY): Payer: Self-pay | Admitting: Internal Medicine

## 2021-11-06 ENCOUNTER — Encounter (HOSPITAL_COMMUNITY)
Admission: EM | Disposition: A | Discharge status: Skilled Nursing Facility | Payer: Self-pay | Source: Home / Self Care | Attending: Internal Medicine

## 2021-11-06 ENCOUNTER — Other Ambulatory Visit: Payer: Self-pay

## 2021-11-06 DIAGNOSIS — E119 Type 2 diabetes mellitus without complications: Secondary | ICD-10-CM

## 2021-11-06 DIAGNOSIS — S42212A Unspecified displaced fracture of surgical neck of left humerus, initial encounter for closed fracture: Secondary | ICD-10-CM | POA: Diagnosis not present

## 2021-11-06 DIAGNOSIS — Z7984 Long term (current) use of oral hypoglycemic drugs: Secondary | ICD-10-CM

## 2021-11-06 DIAGNOSIS — G709 Myoneural disorder, unspecified: Secondary | ICD-10-CM

## 2021-11-06 DIAGNOSIS — I10 Essential (primary) hypertension: Secondary | ICD-10-CM

## 2021-11-06 DIAGNOSIS — S42202A Unspecified fracture of upper end of left humerus, initial encounter for closed fracture: Secondary | ICD-10-CM

## 2021-11-06 HISTORY — PX: ORIF HUMERUS FRACTURE: SHX2126

## 2021-11-06 LAB — GLUCOSE, CAPILLARY
Glucose-Capillary: 174 mg/dL — ABNORMAL HIGH (ref 70–99)
Glucose-Capillary: 134 mg/dL — ABNORMAL HIGH (ref 70–99)
Glucose-Capillary: 182 mg/dL — ABNORMAL HIGH (ref 70–99)
Glucose-Capillary: 432 mg/dL — ABNORMAL HIGH (ref 70–99)

## 2021-11-06 LAB — CBC WITH DIFFERENTIAL/PLATELET
Abs Immature Granulocytes: 0.02 10*3/uL (ref 0.00–0.07)
Basophils Absolute: 0 10*3/uL (ref 0.0–0.1)
Basophils Relative: 1 %
Eosinophils Absolute: 0.2 10*3/uL (ref 0.0–0.5)
Eosinophils Relative: 2 %
HCT: 27.7 % — ABNORMAL LOW (ref 36.0–46.0)
Hemoglobin: 9 g/dL — ABNORMAL LOW (ref 12.0–15.0)
Immature Granulocytes: 0 %
Lymphocytes Relative: 25 %
Lymphs Abs: 1.9 10*3/uL (ref 0.7–4.0)
MCH: 31.7 pg (ref 26.0–34.0)
MCHC: 32.5 g/dL (ref 30.0–36.0)
MCV: 97.5 fL (ref 80.0–100.0)
Monocytes Absolute: 0.8 10*3/uL (ref 0.1–1.0)
Monocytes Relative: 10 %
Neutro Abs: 4.8 10*3/uL (ref 1.7–7.7)
Neutrophils Relative %: 62 %
Platelets: 181 10*3/uL (ref 150–400)
RBC: 2.84 MIL/uL — ABNORMAL LOW (ref 3.87–5.11)
RDW: 13.9 % (ref 11.5–15.5)
WBC: 7.8 10*3/uL (ref 4.0–10.5)
nRBC: 0 % (ref 0.0–0.2)

## 2021-11-06 LAB — BASIC METABOLIC PANEL
Anion gap: 4 — ABNORMAL LOW (ref 5–15)
BUN: 33 mg/dL — ABNORMAL HIGH (ref 8–23)
CO2: 24 mmol/L (ref 22–32)
Calcium: 8.9 mg/dL (ref 8.9–10.3)
Chloride: 108 mmol/L (ref 98–111)
Creatinine, Ser: 1.38 mg/dL — ABNORMAL HIGH (ref 0.44–1.00)
GFR, Estimated: 35 mL/min — ABNORMAL LOW (ref >60)
Glucose, Bld: 276 mg/dL — ABNORMAL HIGH (ref 70–99)
Potassium: 4.8 mmol/L (ref 3.5–5.1)
Sodium: 136 mmol/L (ref 135–145)

## 2021-11-06 LAB — MAGNESIUM: Magnesium: 1.8 mg/dL (ref 1.7–2.4)

## 2021-11-06 LAB — GLUCOSE, RANDOM: Glucose, Bld: 464 mg/dL — ABNORMAL HIGH (ref 70–99)

## 2021-11-06 SURGERY — OPEN REDUCTION INTERNAL FIXATION (ORIF) PROXIMAL HUMERUS FRACTURE
Anesthesia: General | Laterality: Left

## 2021-11-06 MED ORDER — ACETAMINOPHEN 500 MG PO TABS
1000.0000 mg | ORAL_TABLET | Freq: Once | ORAL | Status: AC
  Administered 2021-11-06: 1000 mg via ORAL
  Filled 2021-11-06: qty 2

## 2021-11-06 MED ORDER — ESMOLOL HCL 100 MG/10ML IV SOLN
INTRAVENOUS | Status: DC | PRN
  Administered 2021-11-06: 20 mg via INTRAVENOUS
  Administered 2021-11-06: 30 mg via INTRAVENOUS

## 2021-11-06 MED ORDER — FENTANYL CITRATE PF 50 MCG/ML IJ SOSY: 25.0000 ug | PREFILLED_SYRINGE | INTRAMUSCULAR | Status: DC | PRN

## 2021-11-06 MED ORDER — TRANEXAMIC ACID-NACL 1000-0.7 MG/100ML-% IV SOLN
INTRAVENOUS | Status: AC
  Filled 2021-11-06: qty 100

## 2021-11-06 MED ORDER — CHLORHEXIDINE GLUCONATE 0.12 % MT SOLN
15.0000 mL | Freq: Once | OROMUCOSAL | Status: AC
  Administered 2021-11-06: 15 mL via OROMUCOSAL

## 2021-11-06 MED ORDER — ESMOLOL HCL 100 MG/10ML IV SOLN
INTRAVENOUS | Status: AC
  Filled 2021-11-06: qty 10

## 2021-11-06 MED ORDER — POLYETHYLENE GLYCOL 3350 17 G PO PACK: 17.0000 g | PACK | Freq: Every day | ORAL | Status: DC | PRN

## 2021-11-06 MED ORDER — TRAMADOL HCL 50 MG PO TABS: 50.0000 mg | ORAL_TABLET | Freq: Four times a day (QID) | ORAL | Status: DC | PRN

## 2021-11-06 MED ORDER — ONDANSETRON HCL 4 MG PO TABS: 4.0000 mg | ORAL_TABLET | Freq: Four times a day (QID) | ORAL | Status: DC | PRN

## 2021-11-06 MED ORDER — PHENYLEPHRINE HCL (PRESSORS) 10 MG/ML IV SOLN
INTRAVENOUS | Status: AC
  Filled 2021-11-06: qty 1

## 2021-11-06 MED ORDER — ROCURONIUM BROMIDE 10 MG/ML (PF) SYRINGE
PREFILLED_SYRINGE | INTRAVENOUS | Status: AC
Start: 2021-11-06 — End: ?
  Filled 2021-11-06: qty 10

## 2021-11-06 MED ORDER — LIDOCAINE 2% (20 MG/ML) 5 ML SYRINGE
INTRAMUSCULAR | Status: DC | PRN
  Administered 2021-11-06: 20 mg via INTRAVENOUS
  Administered 2021-11-06: 40 mg via INTRAVENOUS

## 2021-11-06 MED ORDER — BISACODYL 5 MG PO TBEC: 5.0000 mg | DELAYED_RELEASE_TABLET | Freq: Every day | ORAL | Status: DC | PRN

## 2021-11-06 MED ORDER — METOCLOPRAMIDE HCL 5 MG/ML IJ SOLN: 5.0000 mg | Freq: Three times a day (TID) | INTRAMUSCULAR | Status: DC | PRN

## 2021-11-06 MED ORDER — 0.9 % SODIUM CHLORIDE (POUR BTL) OPTIME
TOPICAL | Status: DC | PRN
  Administered 2021-11-06: 1000 mL

## 2021-11-06 MED ORDER — DIPHENHYDRAMINE HCL 12.5 MG/5ML PO ELIX
12.5000 mg | ORAL_SOLUTION | ORAL | Status: DC | PRN
  Administered 2021-11-09: 25 mg via ORAL
  Filled 2021-11-06: qty 10

## 2021-11-06 MED ORDER — INSULIN ASPART 100 UNIT/ML IJ SOLN
10.0000 [IU] | Freq: Once | INTRAMUSCULAR | Status: AC
  Administered 2021-11-06: 10 [IU] via SUBCUTANEOUS

## 2021-11-06 MED ORDER — PHENOL 1.4 % MT LIQD: 1.0000 | OROMUCOSAL | Status: DC | PRN

## 2021-11-06 MED ORDER — PHENYLEPHRINE HCL-NACL 20-0.9 MG/250ML-% IV SOLN
INTRAVENOUS | Status: DC | PRN
  Administered 2021-11-06: 40 ug/min via INTRAVENOUS

## 2021-11-06 MED ORDER — DEXAMETHASONE SODIUM PHOSPHATE 10 MG/ML IJ SOLN
INTRAMUSCULAR | Status: AC
  Filled 2021-11-06: qty 1

## 2021-11-06 MED ORDER — BUPIVACAINE HCL (PF) 0.25 % IJ SOLN
INTRAMUSCULAR | Status: DC | PRN
  Administered 2021-11-06: 15 mL via PERINEURAL

## 2021-11-06 MED ORDER — HYDROCODONE-ACETAMINOPHEN 5-325 MG PO TABS
1.0000 | ORAL_TABLET | ORAL | Status: DC | PRN
  Administered 2021-11-09: 1 via ORAL
  Filled 2021-11-06: qty 1

## 2021-11-06 MED ORDER — MORPHINE SULFATE (PF) 2 MG/ML IV SOLN: 0.5000 mg | INTRAVENOUS | Status: DC | PRN

## 2021-11-06 MED ORDER — SUGAMMADEX SODIUM 200 MG/2ML IV SOLN
INTRAVENOUS | Status: DC | PRN
  Administered 2021-11-06: 200 mg via INTRAVENOUS

## 2021-11-06 MED ORDER — BUPIVACAINE LIPOSOME 1.3 % IJ SUSP
INTRAMUSCULAR | Status: DC | PRN
  Administered 2021-11-06: 10 mL via PERINEURAL

## 2021-11-06 MED ORDER — LIDOCAINE HCL (PF) 2 % IJ SOLN
INTRAMUSCULAR | Status: AC
  Filled 2021-11-06: qty 5

## 2021-11-06 MED ORDER — LACTATED RINGERS IV SOLN: INTRAVENOUS | Status: DC

## 2021-11-06 MED ORDER — TRANEXAMIC ACID 1000 MG/10ML IV SOLN
1000.0000 mg | INTRAVENOUS | Status: AC
  Administered 2021-11-06: 1000 mg via INTRAVENOUS
  Filled 2021-11-06: qty 10

## 2021-11-06 MED ORDER — PROPOFOL 10 MG/ML IV BOLUS
INTRAVENOUS | Status: AC
  Filled 2021-11-06: qty 20

## 2021-11-06 MED ORDER — FENTANYL CITRATE PF 50 MCG/ML IJ SOSY
50.0000 ug | PREFILLED_SYRINGE | Freq: Once | INTRAMUSCULAR | Status: AC
  Administered 2021-11-06: 50 ug via INTRAVENOUS

## 2021-11-06 MED ORDER — MAGNESIUM CITRATE PO SOLN: 1.0000 | Freq: Once | ORAL | Status: DC | PRN

## 2021-11-06 MED ORDER — MENTHOL 3 MG MT LOZG: 1.0000 | LOZENGE | OROMUCOSAL | Status: DC | PRN

## 2021-11-06 MED ORDER — PROPOFOL 10 MG/ML IV BOLUS
INTRAVENOUS | Status: DC | PRN
  Administered 2021-11-06: 70 mg via INTRAVENOUS

## 2021-11-06 MED ORDER — CEFAZOLIN SODIUM-DEXTROSE 2-4 GM/100ML-% IV SOLN
2.0000 g | INTRAVENOUS | Status: AC
  Administered 2021-11-06: 2 g via INTRAVENOUS
  Filled 2021-11-06: qty 100

## 2021-11-06 MED ORDER — ROCURONIUM BROMIDE 10 MG/ML (PF) SYRINGE
PREFILLED_SYRINGE | INTRAVENOUS | Status: DC | PRN
  Administered 2021-11-06: 30 mg via INTRAVENOUS

## 2021-11-06 MED ORDER — AMISULPRIDE (ANTIEMETIC) 5 MG/2ML IV SOLN: 10.0000 mg | Freq: Once | INTRAVENOUS | Status: DC | PRN

## 2021-11-06 MED ORDER — DEXAMETHASONE SODIUM PHOSPHATE 10 MG/ML IJ SOLN
INTRAMUSCULAR | Status: DC | PRN
  Administered 2021-11-06: 4 mg via INTRAVENOUS

## 2021-11-06 MED ORDER — DOCUSATE SODIUM 100 MG PO CAPS
100.0000 mg | ORAL_CAPSULE | Freq: Two times a day (BID) | ORAL | Status: DC
  Administered 2021-11-06 – 2021-11-10 (×7): 100 mg via ORAL
  Filled 2021-11-06 (×7): qty 1

## 2021-11-06 MED ORDER — PHENYLEPHRINE 80 MCG/ML (10ML) SYRINGE FOR IV PUSH (FOR BLOOD PRESSURE SUPPORT)
PREFILLED_SYRINGE | INTRAVENOUS | Status: DC | PRN
  Administered 2021-11-06 (×3): 80 ug via INTRAVENOUS

## 2021-11-06 MED ORDER — FENTANYL CITRATE PF 50 MCG/ML IJ SOSY
PREFILLED_SYRINGE | INTRAMUSCULAR | Status: AC
  Filled 2021-11-06: qty 1

## 2021-11-06 MED ORDER — ACETAMINOPHEN 325 MG PO TABS
325.0000 mg | ORAL_TABLET | Freq: Four times a day (QID) | ORAL | Status: DC | PRN
  Administered 2021-11-09: 650 mg via ORAL
  Filled 2021-11-06: qty 2

## 2021-11-06 MED ORDER — ONDANSETRON HCL 4 MG/2ML IJ SOLN
INTRAMUSCULAR | Status: DC | PRN
  Administered 2021-11-06: 4 mg via INTRAVENOUS

## 2021-11-06 MED ORDER — METOCLOPRAMIDE HCL 5 MG PO TABS: 5.0000 mg | ORAL_TABLET | Freq: Three times a day (TID) | ORAL | Status: DC | PRN

## 2021-11-06 MED ORDER — ONDANSETRON HCL 4 MG/2ML IJ SOLN: 4.0000 mg | Freq: Four times a day (QID) | INTRAMUSCULAR | Status: DC | PRN

## 2021-11-06 MED ORDER — ONDANSETRON HCL 4 MG/2ML IJ SOLN
INTRAMUSCULAR | Status: AC
Start: 2021-11-06 — End: ?
  Filled 2021-11-06: qty 2

## 2021-11-06 SURGICAL SUPPLY — 73 items
BAG COUNTER SPONGE SURGICOUNT (BAG) IMPLANT
BAG ZIPLOCK 12X15 (MISCELLANEOUS) ×1 IMPLANT
BIT DRILL 3.2 (BIT) ×1
BIT DRILL 3.2XCALB NS DISP (BIT) IMPLANT
BIT DRILL CALIBRATED 2.7 (BIT) IMPLANT
BIT DRL 3.2XCALB NS DISP (BIT) ×1
COVER SURGICAL LIGHT HANDLE (MISCELLANEOUS) ×1 IMPLANT
DERMABOND ADVANCED (GAUZE/BANDAGES/DRESSINGS) ×1
DERMABOND ADVANCED .7 DNX12 (GAUZE/BANDAGES/DRESSINGS) ×1 IMPLANT
DRAPE C-ARM 42X120 X-RAY (DRAPES) ×1 IMPLANT
DRAPE C-ARMOR (DRAPES) IMPLANT
DRAPE INCISE IOBAN 66X45 STRL (DRAPES) IMPLANT
DRAPE ORTHO SPLIT 77X108 STRL (DRAPES) ×2
DRAPE SURG 17X11 SM STRL (DRAPES) ×1 IMPLANT
DRAPE SURG ORHT 6 SPLT 77X108 (DRAPES) ×2 IMPLANT
DRAPE TOP 10253 STERILE (DRAPES) IMPLANT
DRAPE U-SHAPE 47X51 STRL (DRAPES) ×1 IMPLANT
DRSG AQUACEL AG ADV 3.5X 6 (GAUZE/BANDAGES/DRESSINGS) IMPLANT
DRSG AQUACEL AG ADV 3.5X10 (GAUZE/BANDAGES/DRESSINGS) IMPLANT
DURAPREP 26ML APPLICATOR (WOUND CARE) ×1 IMPLANT
ELECT BLADE TIP CTD 4 INCH (ELECTRODE) ×1 IMPLANT
ELECT PENCIL ROCKER SW 15FT (MISCELLANEOUS) IMPLANT
ELECT REM PT RETURN 15FT ADLT (MISCELLANEOUS) ×1 IMPLANT
GLOVE BIO SURGEON STRL SZ7.5 (GLOVE) ×1 IMPLANT
GLOVE BIO SURGEON STRL SZ8 (GLOVE) ×1 IMPLANT
GLOVE SS BIOGEL STRL SZ 7 (GLOVE) ×1 IMPLANT
GLOVE SS BIOGEL STRL SZ 7.5 (GLOVE) ×1 IMPLANT
GLOVE SUPERSENSE BIOGEL SZ 7 (GLOVE) ×1
GLOVE SUPERSENSE BIOGEL SZ 7.5 (GLOVE) ×1
GOWN STRL REUS W/ TWL LRG LVL3 (GOWN DISPOSABLE) ×2 IMPLANT
GOWN STRL REUS W/TWL LRG LVL3 (GOWN DISPOSABLE) ×2
K-WIRE 2.0 (WIRE) ×1
K-WIRE 2X5 SS THRDED S3 (WIRE) ×2
K-WIRE TROCAR PT 2.0 150MM (WIRE) ×1
KIT BASIN OR (CUSTOM PROCEDURE TRAY) ×1 IMPLANT
KIT TURNOVER KIT A (KITS) IMPLANT
KWIRE 2X5 SS THRDED S3 (WIRE) IMPLANT
KWIRE TROCAR PT 2.0 150 (WIRE) IMPLANT
KWIRE TROCAR PT 2.0 150MM (WIRE) ×1 IMPLANT
MANIFOLD NEPTUNE II (INSTRUMENTS) ×1 IMPLANT
NDL TAPERED W/ NITINOL LOOP (MISCELLANEOUS) IMPLANT
NEEDLE TAPERED W/ NITINOL LOOP (MISCELLANEOUS) IMPLANT
NS IRRIG 1000ML POUR BTL (IV SOLUTION) ×1 IMPLANT
PACK SHOULDER (CUSTOM PROCEDURE TRAY) ×1 IMPLANT
PAD ARMBOARD 7.5X6 YLW CONV (MISCELLANEOUS) ×2 IMPLANT
PEG LOCKING 3.2X34 (Screw) IMPLANT
PEG LOCKING 3.2X36 (Screw) IMPLANT
PEG LOCKING 3.2X38 (Screw) IMPLANT
PEG LOCKING 3.2X40 (Peg) IMPLANT
PEG LOCKING 3.2X42 (Screw) IMPLANT
PEG LOCKING 3.2X48 (Peg) IMPLANT
PEG LOCKING 3.2X52 (Peg) IMPLANT
PLATE PROX HUM HI L 3H 80 (Plate) IMPLANT
PROTECTOR NERVE ULNAR (MISCELLANEOUS) ×1 IMPLANT
PUTTY DBM STAGRAFT PLUS 10CC (Putty) IMPLANT
RESTRAINT HEAD UNIVERSAL NS (MISCELLANEOUS) ×1 IMPLANT
SCREW LP NL T15 3.5X22 (Screw) IMPLANT
SCREW LP NL T15 3.5X24 (Screw) IMPLANT
SLEEVE MEASURING 3.2 (BIT) IMPLANT
SLING ARM FOAM STRAP LRG (SOFTGOODS) IMPLANT
SLING ARM FOAM STRAP MED (SOFTGOODS) IMPLANT
SLING ARM FOAM STRAP SML (SOFTGOODS) IMPLANT
SUCTION FRAZIER HANDLE 12FR (TUBING) ×1
SUCTION TUBE FRAZIER 12FR DISP (TUBING) ×1 IMPLANT
SUT FIBERWIRE #2 38 T-5 BLUE (SUTURE) ×2
SUT MNCRL AB 3-0 PS2 18 (SUTURE) ×1 IMPLANT
SUT MON AB 2-0 CT1 36 (SUTURE) ×1 IMPLANT
SUT VIC AB 1 CT1 36 (SUTURE) ×1 IMPLANT
SUTURE FIBERWR #2 38 T-5 BLUE (SUTURE) IMPLANT
TOWEL OR 17X26 10 PK STRL BLUE (TOWEL DISPOSABLE) ×1 IMPLANT
TOWEL OR NON WOVEN STRL DISP B (DISPOSABLE) ×1 IMPLANT
TUBE SUCTION HIGH CAP CLEAR NV (SUCTIONS) IMPLANT
YANKAUER SUCT BULB TIP 10FT TU (MISCELLANEOUS) ×1 IMPLANT

## 2021-11-06 NOTE — Plan of Care (Signed)

## 2021-11-06 NOTE — Anesthesia Postprocedure Evaluation (Signed)
Anesthesia Post Note  Patient: Cheryl Salas  Procedure(s) Performed: OPEN REDUCTION INTERNAL FIXATION (ORIF) PROXIMAL HUMERUS FRACTURE (Left)     Patient location during evaluation: PACU Anesthesia Type: General Level of consciousness: awake and alert Pain management: pain level controlled Vital Signs Assessment: post-procedure vital signs reviewed and stable Respiratory status: spontaneous breathing, nonlabored ventilation, respiratory function stable and patient connected to nasal cannula oxygen Cardiovascular status: blood pressure returned to baseline and stable Postop Assessment: no apparent nausea or vomiting Anesthetic complications: no   No notable events documented.  Last Vitals:  Vitals:   11/06/21 1820 11/06/21 2031  BP: 131/74 117/62  Pulse: (!) 113 (!) 103  Resp: 18 17  Temp: 36.6 C 36.6 C  SpO2: 100% 96%    Last Pain:  Vitals:   11/06/21 2100  TempSrc:   PainSc: 0-No pain                 Tiajuana Amass

## 2021-11-06 NOTE — Anesthesia Procedure Notes (Signed)
Procedure Name: Intubation Date/Time: 11/06/2021 3:17 PM  Performed by: Milford Cage, CRNAPre-anesthesia Checklist: Patient identified, Emergency Drugs available, Suction available and Patient being monitored Patient Re-evaluated:Patient Re-evaluated prior to induction Oxygen Delivery Method: Circle system utilized Preoxygenation: Pre-oxygenation with 100% oxygen Induction Type: IV induction Ventilation: Mask ventilation without difficulty Laryngoscope Size: Mac and 3 Grade View: Grade I Tube type: Oral Tube size: 6.5 mm Number of attempts: 1 Airway Equipment and Method: Stylet Placement Confirmation: ETT inserted through vocal cords under direct vision, positive ETCO2 and breath sounds checked- equal and bilateral Secured at: 21 cm Tube secured with: Tape Dental Injury: Teeth and Oropharynx as per pre-operative assessment

## 2021-11-06 NOTE — Progress Notes (Signed)
Dr. Sabino Gasser has responded to being notified of pt's yellow mews.  No new orders received at this time.  Will continue to monitor pt.

## 2021-11-06 NOTE — Progress Notes (Signed)
Pt CBG was 432 at 21:46. Pt resting in bed, no distress noted. VS stable, ordered a stat lab verification. Notified the [facility] floor coverage B. Lennox Grumbles, NP. New orders received. Will continue to monitor.

## 2021-11-06 NOTE — Progress Notes (Signed)
Pt has arrived back to 1344 from PACU s/p ORIF of left humerus fx. Report accepted from [place].  Pt is alert and oriented.  Son at bedside.  Call bell is within reach.

## 2021-11-06 NOTE — Op Note (Signed)
11/02/2021 - 11/06/2021  5:02 PM  PATIENT:   Cheryl Salas  88 y.o. female  PRE-OPERATIVE DIAGNOSIS: Severely displaced left 2 part proximal humerus fracture  POST-OPERATIVE DIAGNOSIS: Same  PROCEDURE: Open reduction and internal fixation of severely displaced left 2 part proximal humerus fracture  SURGEON:  Marcella Charlson, Metta Clines M.D.  ASSISTANTS: Jenetta Loges, PA-C  ANESTHESIA:   General endotracheal and interscalene block with Exparel  EBL: 150 cc  SPECIMEN: None  Drains: None   PATIENT DISPOSITION:  PACU - hemodynamically stable.    PLAN OF CARE: Admit to inpatient   Brief history:  Patient is a 88 year old female who has been living independently and sustained a ground level fall injuring her left shoulder with immediate complaints of severe pain swelling and inability to elevate left arm.  She was subsequently evaluated at [facility] where she was found to have a severely displaced left three-part proximal humerus fracture.  She was admitted to the medicine service and there was a discussion as to treatment options.  Given her high level of function and vitality and as well as the degree of displacement we have discussed with her and her son operative management with open reduction and internal fixation.  Possible surgical complications were reviewed including the potential for bleeding, infection, neurovascular injury, persistent pain, loss of motion, anesthetic complication, malunion, nonunion, loss of fixation, and possible need for additional surgery were all reviewed.  They understand, and accepts, and agree with the plan procedure.  Procedure detail:  After undergoing routine preop evaluation the patient received prophylactic antibiotics and interscalene block with Exparel was established in the holding area by the anesthesia department.  Placed spine on the operating table and underwent the smooth induction of a general endotracheal anesthesia.  Placed into the  beachchair position and appropriately padded and protected.  Fluoroscopic imaging was then brought in we confirmed that proper visualization could be obtained.  The left shoulder girdle region was then sterilely prepped and draped in standard fashion.  Timeout was called.  A deltopectoral approach left shoulder is made an approximately 10 cm incision.  Skin flaps were elevated dissection carried deeply deltopectoral interval was then opened from proximal to distal.  No dominant vein was identified.  Dissection was then carried distally to the anterolateral humeral shaft for anticipated placement of our proximal humeral plate.  The long head bicep tendon was identified and this was used as a landmark to help with reduction at this severely displaced and severely unstable fracture.  Using fluoroscopic imaging we then provisionally placed a 3-hole Biomet proximal humeral plate over the anterolateral aspect of the humerus and considerable attempts at reduction were required and ultimately were able to gain appropriate alignment and the instability the fracture pattern required that we actually fasten the plate to the humeral head segment initially with a guidepin followed by 3 pegs and then once we confirmed overall good position and alignment we then fastened the plate to the shaft and then took additional fluoroscopic images to confirm that the overall alignment and reduction was to our satisfaction.  Once this was accomplished we then completed fixation utilizing the balance of the pegs up in the humeral head and a total of 3 screws through the plate into the humeral shaft.  Upon completion fluoroscopic imaging was used to confirm that there was good alignment of the fracture site and all hardware was properly positioned within the humeral head and shaft.  This point the wounds copes irrigated.  Hemostasis was obtained.  10 cc of stay graft bone graft was then packed into the fracture site.  The deltopectoral  interval was then reapproximated with a series of figure-of-eight number Vicryl sutures.  2-0 Monocryl used to close the subcu layer and intracuticular 3-0 Monocryl the skin followed by Dermabond and Aquacel dressing.  The left arm was then placed into a sling and the patient was awakened, extubated, and taken to the recovery room in stable condition.  Jenetta Loges, PA-C was utilized as an Environmental consultant throughout this case, essential for help with positioning the patient, positioning extremity, tissue manipulation, implantation of the prosthesis, suture management, wound closure, and intraoperative decision-making.  Marin Shutter MD   Contact # [phone]

## 2021-11-06 NOTE — Progress Notes (Signed)
Cheryl Salas  MRN: [id number] DOB/Age: [birth year] 88 y.o. Physician: Ander Slade, M.D. Day of Surgery Procedure(s) (LRB): OPEN REDUCTION INTERNAL FIXATION (ORIF) PROXIMAL HUMERUS FRACTURE (Left)  Subjective: Recent events noted.  Patient scheduled for ORIF this afternoon. Vital Signs Temp:  [98.4 F (36.9 C)-98.6 F (37 C)] 98.4 F (36.9 C) (08/31 0533) Pulse Rate:  [95-109] 96 (08/31 0533) Resp:  [16-18] 17 (08/31 0533) BP: (115-136)/(51-76) 136/76 (08/31 0533) SpO2:  [98 %-99 %] 99 % (08/31 0533)  Lab Results Recent Labs    11/04/21 0330 11/06/21 0334  WBC 6.0 7.8  HGB 8.7* 9.0*  HCT 27.0* 27.7*  PLT 153 181   BMET Recent Labs    11/06/21 0334  NA 136  K 4.8  CL 108  CO2 24  GLUCOSE 276*  BUN 33*  CREATININE 1.38*  CALCIUM 8.9   No results found for: "INR"   Exam  Exam unchanged from evaluation on 8/29  Plan Severely displaced left 2 part humerus fracture with plan for ORIF this afternoon.  I have communicated with the patient's son regarding treatment plan.  We will follow-up with him postop. Yang Rack M Cache Bills 11/06/2021, 9:32 AM   Contact # [phone]

## 2021-11-06 NOTE — Plan of Care (Signed)
  Problem: Education: Goal: Knowledge of General Education information will improve Description: Including pain rating scale, medication(s)/side effects and non-pharmacologic comfort measures Outcome: Progressing   Problem: Pain Managment: Goal: General experience of comfort will improve Outcome: Progressing   Problem: Safety: Goal: Ability to remain free from injury will improve Outcome: Progressing   Problem: Education: Goal: Knowledge of the prescribed therapeutic regimen will improve Outcome: Progressing   

## 2021-11-06 NOTE — Anesthesia Procedure Notes (Signed)
Anesthesia Regional Block: Interscalene brachial plexus block   Pre-Anesthetic Checklist: , timeout performed,  Correct Patient, Correct Site, Correct Laterality,  Correct Procedure, Correct Position, site marked,  Risks and benefits discussed,  Surgical consent,  Pre-op evaluation,  At surgeon's request and post-op pain management  Laterality: Left  Prep: chloraprep       Needles:  Injection technique: Single-shot  Needle Type: Echogenic Stimulator Needle     Needle Length: 9cm  Needle Gauge: 21     Additional Needles:   Procedures:, nerve stimulator,,, ultrasound used (permanent image in chart),,     Nerve Stimulator or Paresthesia:  Response: deltoid, 0.5 mA  Additional Responses:   Narrative:  Start time: 11/06/2021 1:40 PM End time: 11/06/2021 1:45 PM Injection made incrementally with aspirations every 5 mL.  Performed by: Personally  Anesthesiologist: Suzette Battiest, MD

## 2021-11-06 NOTE — Anesthesia Preprocedure Evaluation (Signed)
Anesthesia Evaluation  Patient identified by MRN, date of birth, ID band Patient awake    Reviewed: Allergy & Precautions, NPO status , Patient's Chart, lab work & pertinent test results  Airway Mallampati: II  TM Distance: >3 FB     Dental  (+) Dental Advisory Given   Pulmonary neg pulmonary ROS,    breath sounds clear to auscultation       Cardiovascular hypertension, Pt. on medications + dysrhythmias  Rhythm:Regular Rate:Normal     Neuro/Psych  Neuromuscular disease    GI/Hepatic negative GI ROS, Neg liver ROS,   Endo/Other  diabetes, Type 2  Renal/GU Renal InsufficiencyRenal disease     Musculoskeletal   Abdominal   Peds  Hematology  (+) Blood dyscrasia, anemia ,   Anesthesia Other Findings   Reproductive/Obstetrics                             Lab Results  Component Value Date   WBC 7.8 11/06/2021   HGB 9.0 (L) 11/06/2021   HCT 27.7 (L) 11/06/2021   MCV 97.5 11/06/2021   PLT 181 11/06/2021   Lab Results  Component Value Date   CREATININE 1.38 (H) 11/06/2021   BUN 33 (H) 11/06/2021   NA 136 11/06/2021   K 4.8 11/06/2021   CL 108 11/06/2021   CO2 24 11/06/2021    Anesthesia Physical Anesthesia Plan  ASA: 3  Anesthesia Plan: General   Post-op Pain Management: Regional block* and Tylenol PO (pre-op)*   Induction: Intravenous  PONV Risk Score and Plan: 3 and Dexamethasone, Ondansetron and Treatment may vary due to age or medical condition  Airway Management Planned: Oral ETT  Additional Equipment: None  Intra-op Plan:   Post-operative Plan: Extubation in OR  Informed Consent: I have reviewed the patients History and Physical, chart, labs and discussed the procedure including the risks, benefits and alternatives for the proposed anesthesia with the patient or authorized representative who has indicated his/her understanding and acceptance.     Dental advisory  given  Plan Discussed with: CRNA  Anesthesia Plan Comments:         Anesthesia Quick Evaluation

## 2021-11-06 NOTE — Progress Notes (Signed)
   11/06/21 1820  Assess: MEWS Score  Temp 97.8 F (36.6 C)  BP 131/74  MAP (mmHg) 89  Pulse Rate (!) 113  Resp 18  Level of Consciousness Alert  SpO2 100 %  O2 Device Room Air  Assess: MEWS Score  MEWS Temp 0  MEWS Systolic 0  MEWS Pulse 2  MEWS RR 0  MEWS LOC 0  MEWS Score 2  MEWS Score Color Yellow  Assess: if the MEWS score is Yellow or Red  Were vital signs taken at a resting state? Yes  Focused Assessment No change from prior assessment  Does the patient meet 2 or more of the SIRS criteria? No  MEWS guidelines implemented *See Row Information* Yes  Assess: SIRS CRITERIA  SIRS Temperature  0  SIRS Pulse 1  SIRS Respirations  0  SIRS WBC 1  SIRS Score Sum  2   Pt note to have yellow MEWS since arrival back to unit from PACU.  Dr. Sabino Gasser made aware via secure chat.  Currently, awaiting a response.  Pt remains alert, oriented, and without complaints.

## 2021-11-06 NOTE — Progress Notes (Signed)
Pt is being transported to short stay at this time via bed.  Pt remains alert and oriented.  Report given to Teresa,RN.  Pt's son remains at bedside.

## 2021-11-06 NOTE — Progress Notes (Signed)
Progress Note    Cheryl Salas   NUU:[id number]  DOB: February 05, 1924  DOA: 11/02/2021     3 PCP: Gayland Curry, DO  Initial CC: fall at home  Hospital Course: Ms. Cheryl Salas is a 88 y.o. F with PMH DMII, CKD IIIb, hx diverticulitis s/p ileostomy, and hx bladder/breast cancer with bilateral mastectomy wh presented with left arm pain after a mechanical fall.    Workup was notable for a proximal left humeral fracture with angulation and lateral displacement.   Interval History:  No events overnight.  Son present bedside this morning.  Patient was sleeping comfortably but aroused easily.  Plan for surgery in the afternoon.  Assessment and Plan: * Left humeral fracture - s/p mechanical fall; uses a walker at baseline but does have rather high functional baseline - medical clearance provided on 8/30 - patient undergoing surgical repair today; seen this morning prior to surgery with son present bedside as well - patient resting comfortably in bed - bruising persists on left shoulder; Hgb 9 g/dL and stable today - follow up surgery results and Hgb in am  Ileostomy status ([place]) - ostomy in place; patient able to manage at home independently - likely in postop phase she will need assistance with this and SNF would be appropriate   Left hip pain - Seems over the trochanter.  No pain with ROM of the left hip.  No bruising to suggest hematoma. - xray obtained on 8/29 shows old healed B/L superior and inferior pubic rami fractures; diffuse decreased bone mineralization noted  - will start her on Ca + Vit D as well   Iron deficiency anemia - Hgb slightly down; likely from large bruising on left shoulder - baseline ~10 - 11 g/dL - monitor Hgb post op  Type 2 diabetes mellitus with complication, without long-term current use of insulin (HCC) A1c 7.8%. Glucose normal here - Continue SS corrections - Hold Jardiance  Stage 3b chronic kidney disease (CKD) (HCC) Baseline Cr 1.2-1.6, stable  1st  degree AV block - very mildly increased PR interval; old EKGs reviewed and present as well - patient asymptomatic  - no further workup at this time - prior echo reviewed from March 2023 as well; preserved EF and mild LVH; no RWMA  Hypomagnesemia - Supplement Mag  Hyperlipidemia - Continue Crestor  Essential hypertension, ruled out - Patient confirms she has no diagnosis of HTN.  Her hydralazine was a new medicine? which may be unneeded - nevertheless, okay to monitor vitals off BP meds at this time   Old records reviewed in assessment of this patient  Antimicrobials:   DVT prophylaxis:  enoxaparin (LOVENOX) injection 30 mg Start: 11/03/21 1000   Code Status:   Code Status: Full Code  Mobility Assessment (last 72 hours)     Mobility Assessment     Row Name 11/06/21 0830 11/05/21 2155 11/05/21 1337 11/05/21 0822 11/05/21 0807   Does patient have an order for bedrest or is patient medically unstable No - Continue assessment No - Continue assessment -- No - Continue assessment No - Continue assessment   What is the highest level of mobility based on the progressive mobility assessment? Level 5 (Walks with assist in room/hall) - Balance while stepping forward/back and can walk in room with assist - Complete Level 5 (Walks with assist in room/hall) - Balance while stepping forward/back and can walk in room with assist - Complete Level 5 (Walks with assist in room/hall) - Balance while stepping forward/back and can  walk in room with assist - Complete Level 5 (Walks with assist in room/hall) - Balance while stepping forward/back and can walk in room with assist - Complete Level 5 (Walks with assist in room/hall) - Balance while stepping forward/back and can walk in room with assist - Complete    Row Name 11/05/21 0100 11/04/21 0836 11/03/21 1915 11/03/21 1706     Does patient have an order for bedrest or is patient medically unstable No - Continue assessment No - Continue assessment No  - Continue assessment No - Continue assessment    What is the highest level of mobility based on the progressive mobility assessment? Level 5 (Walks with assist in room/hall) - Balance while stepping forward/back and can walk in room with assist - Complete Level 5 (Walks with assist in room/hall) - Balance while stepping forward/back and can walk in room with assist - Complete Level 5 (Walks with assist in room/hall) - Balance while stepping forward/back and can walk in room with assist - Complete Level 5 (Walks with assist in room/hall) - Balance while stepping forward/back and can walk in room with assist - Complete             Barriers to discharge:  Disposition Plan:  SNF Status is: Inpt  Objective: Blood pressure 139/80, pulse 99, temperature 98.2 F (36.8 C), temperature source Oral, resp. rate 16, height '4\' 11"'$  (1.499 m), weight 58.3 kg, SpO2 96 %.  Examination:  Physical Exam Constitutional:      General: She is not in acute distress.    Appearance: Normal appearance. She is not ill-appearing.  HENT:     Head: Normocephalic and atraumatic.     Mouth/Throat:     Mouth: Mucous membranes are moist.  Eyes:     Extraocular Movements: Extraocular movements intact.  Cardiovascular:     Rate and Rhythm: Normal rate and regular rhythm.  Pulmonary:     Effort: Pulmonary effort is normal. No respiratory distress.     Breath sounds: Normal breath sounds.  Abdominal:     General: Bowel sounds are normal. There is no distension.     Palpations: Abdomen is soft.     Tenderness: There is no abdominal tenderness.  Musculoskeletal:     Cervical back: Normal range of motion and neck supple.     Comments: LUE in sling; shoulder noted with bruising. Does have hand grip strength in left hand  Neurological:     Mental Status: She is alert.     Comments: Sensation intact in LUE  Psychiatric:        Mood and Affect: Mood normal.      Consultants:  Orthopedic surgery  Procedures:     Data Reviewed: Results for orders placed or performed during the hospital encounter of 11/02/21 (from the past 24 hour(s))  Glucose, capillary     Status: Abnormal   Collection Time: 11/05/21  5:10 PM  Result Value Ref Range   Glucose-Capillary 169 (H) 70 - 99 mg/dL  Glucose, capillary     Status: Abnormal   Collection Time: 11/05/21  9:29 PM  Result Value Ref Range   Glucose-Capillary 305 (H) 70 - 99 mg/dL  CBC with Differential/Platelet     Status: Abnormal   Collection Time: 11/06/21  3:34 AM  Result Value Ref Range   WBC 7.8 4.0 - 10.5 K/uL   RBC 2.84 (L) 3.87 - 5.11 MIL/uL   Hemoglobin 9.0 (L) 12.0 - 15.0 g/dL   HCT 27.7 (L) 36.0 -  46.0 %   MCV 97.5 80.0 - 100.0 fL   MCH 31.7 26.0 - 34.0 pg   MCHC 32.5 30.0 - 36.0 g/dL   RDW 13.9 11.5 - 15.5 %   Platelets 181 150 - 400 K/uL   nRBC 0.0 0.0 - 0.2 %   Neutrophils Relative % 62 %   Neutro Abs 4.8 1.7 - 7.7 K/uL   Lymphocytes Relative 25 %   Lymphs Abs 1.9 0.7 - 4.0 K/uL   Monocytes Relative 10 %   Monocytes Absolute 0.8 0.1 - 1.0 K/uL   Eosinophils Relative 2 %   Eosinophils Absolute 0.2 0.0 - 0.5 K/uL   Basophils Relative 1 %   Basophils Absolute 0.0 0.0 - 0.1 K/uL   Immature Granulocytes 0 %   Abs Immature Granulocytes 0.02 0.00 - 0.07 K/uL  Basic metabolic panel     Status: Abnormal   Collection Time: 11/06/21  3:34 AM  Result Value Ref Range   Sodium 136 135 - 145 mmol/L   Potassium 4.8 3.5 - 5.1 mmol/L   Chloride 108 98 - 111 mmol/L   CO2 24 22 - 32 mmol/L   Glucose, Bld 276 (H) 70 - 99 mg/dL   BUN 33 (H) 8 - 23 mg/dL   Creatinine, Ser 1.38 (H) 0.44 - 1.00 mg/dL   Calcium 8.9 8.9 - 10.3 mg/dL   GFR, Estimated 35 (L) >60 mL/min   Anion gap 4 (L) 5 - 15  Magnesium     Status: None   Collection Time: 11/06/21  3:34 AM  Result Value Ref Range   Magnesium 1.8 1.7 - 2.4 mg/dL  Glucose, capillary     Status: Abnormal   Collection Time: 11/06/21  7:34 AM  Result Value Ref Range   Glucose-Capillary 174 (H) 70  - 99 mg/dL  Glucose, capillary     Status: Abnormal   Collection Time: 11/06/21 11:27 AM  Result Value Ref Range   Glucose-Capillary 134 (H) 70 - 99 mg/dL    I have Reviewed nursing notes, Vitals, and Lab results since pt's last encounter. Pertinent lab results : see above I have ordered test including BMP, CBC, Mg I have reviewed the last note from staff over past 24 hours I have discussed pt's care plan and test results with nursing staff, case manager   LOS: 3 days   Dwyane Dee, MD [facility] 11/06/2021, 3:23 PM

## 2021-11-06 NOTE — Transfer of Care (Signed)
Immediate Anesthesia Transfer of Care Note  Patient: Cheryl Salas  Procedure(s) Performed: OPEN REDUCTION INTERNAL FIXATION (ORIF) PROXIMAL HUMERUS FRACTURE (Left)  Patient Location: PACU  Anesthesia Type:General  Level of Consciousness: awake  Airway & Oxygen Therapy: Patient Spontanous Breathing and Patient connected to face mask oxygen  Post-op Assessment: Report given to RN and Post -op Vital signs reviewed and stable  Post vital signs: Reviewed and stable  Last Vitals:  Vitals Value Taken Time  BP 137/93 11/06/21 1733  Temp    Pulse 96 11/06/21 1736  Resp 27 11/06/21 1736  SpO2 100 % 11/06/21 1736  Vitals shown include unvalidated device data.  Last Pain:  Vitals:   11/06/21 1346  TempSrc:   PainSc: 0-No pain      Patients Stated Pain Goal: 0 (09/38/18 2993)  Complications: No notable events documented.

## 2021-11-07 ENCOUNTER — Encounter (HOSPITAL_COMMUNITY): Payer: Self-pay | Admitting: Orthopedic Surgery

## 2021-11-07 DIAGNOSIS — S42212A Unspecified displaced fracture of surgical neck of left humerus, initial encounter for closed fracture: Secondary | ICD-10-CM | POA: Diagnosis not present

## 2021-11-07 DIAGNOSIS — R000 Tachycardia, unspecified: Secondary | ICD-10-CM | POA: Diagnosis not present

## 2021-11-07 LAB — CBC WITH DIFFERENTIAL/PLATELET
Abs Immature Granulocytes: 0.04 10*3/uL (ref 0.00–0.07)
Basophils Absolute: 0 10*3/uL (ref 0.0–0.1)
Basophils Relative: 0 %
Eosinophils Absolute: 0 10*3/uL (ref 0.0–0.5)
Eosinophils Relative: 0 %
HCT: 26.7 % — ABNORMAL LOW (ref 36.0–46.0)
Hemoglobin: 8.5 g/dL — ABNORMAL LOW (ref 12.0–15.0)
Immature Granulocytes: 1 %
Lymphocytes Relative: 15 %
Lymphs Abs: 1 10*3/uL (ref 0.7–4.0)
MCH: 31.3 pg (ref 26.0–34.0)
MCHC: 31.8 g/dL (ref 30.0–36.0)
MCV: 98.2 fL (ref 80.0–100.0)
Monocytes Absolute: 0.4 10*3/uL (ref 0.1–1.0)
Monocytes Relative: 6 %
Neutro Abs: 5.1 10*3/uL (ref 1.7–7.7)
Neutrophils Relative %: 78 %
Platelets: 185 10*3/uL (ref 150–400)
RBC: 2.72 MIL/uL — ABNORMAL LOW (ref 3.87–5.11)
RDW: 13.9 % (ref 11.5–15.5)
WBC: 6.6 10*3/uL (ref 4.0–10.5)
nRBC: 0 % (ref 0.0–0.2)

## 2021-11-07 LAB — BASIC METABOLIC PANEL
Anion gap: 8 (ref 5–15)
BUN: 33 mg/dL — ABNORMAL HIGH (ref 8–23)
CO2: 20 mmol/L — ABNORMAL LOW (ref 22–32)
Calcium: 9 mg/dL (ref 8.9–10.3)
Chloride: 109 mmol/L (ref 98–111)
Creatinine, Ser: 1.45 mg/dL — ABNORMAL HIGH (ref 0.44–1.00)
GFR, Estimated: 33 mL/min — ABNORMAL LOW (ref >60)
Glucose, Bld: 241 mg/dL — ABNORMAL HIGH (ref 70–99)
Potassium: 4.5 mmol/L (ref 3.5–5.1)
Sodium: 137 mmol/L (ref 135–145)

## 2021-11-07 LAB — GLUCOSE, CAPILLARY
Glucose-Capillary: 362 mg/dL — ABNORMAL HIGH (ref 70–99)
Glucose-Capillary: 199 mg/dL — ABNORMAL HIGH (ref 70–99)
Glucose-Capillary: 150 mg/dL — ABNORMAL HIGH (ref 70–99)
Glucose-Capillary: 143 mg/dL — ABNORMAL HIGH (ref 70–99)
Glucose-Capillary: 234 mg/dL — ABNORMAL HIGH (ref 70–99)
Glucose-Capillary: 322 mg/dL — ABNORMAL HIGH (ref 70–99)
Glucose-Capillary: 228 mg/dL — ABNORMAL HIGH (ref 70–99)

## 2021-11-07 LAB — MAGNESIUM: Magnesium: 1.7 mg/dL (ref 1.7–2.4)

## 2021-11-07 MED ORDER — PROPOFOL 10 MG/ML IV BOLUS
INTRAVENOUS | Status: AC
  Filled 2021-11-07: qty 20

## 2021-11-07 MED ORDER — INSULIN ASPART 100 UNIT/ML IJ SOLN
5.0000 [IU] | Freq: Once | INTRAMUSCULAR | Status: AC
  Administered 2021-11-07: 5 [IU] via SUBCUTANEOUS

## 2021-11-07 NOTE — Care Management Important Message (Signed)
Important Message  Patient Details IM Letter placed in Patients room. Name: Cheryl Salas MRN: [id number] Date of Birth: Jan 23, 1924   Medicare Important Message Given:  Yes     Kerin Salen 11/07/2021, 12:40 PM

## 2021-11-07 NOTE — Progress Notes (Signed)
Physical Therapy Treatment Patient Details Name: Cheryl Salas MRN: [id number] DOB: 1924/02/15 Today's Date: 11/07/2021   History of Present Illness Cheryl Salas is a 88 y.o. female with medical history significant for essential hypertension, type 2 diabetes, hyperlipidemia, CKD 3B, prior diverticulitis status post ileostomy, hysterectomy/bladder suspension, history of bladder/breast cancer with bilateral mastectomy, ambulatory dysfunction with use of a walker, who presented to [facility] ED from home with complaints of left shoulder pain after a mechanical fall on 11/02/21 and sustained left displaced  humeral neck fracture, conservative management with sling    PT Comments    Pt is mobilizing well, she ambulated 160' holding the IV pole, no loss of balance. Pt had incontinence of urine upon standing. Min assist for bed mobility. She denied L shoulder pain throughout PT session.    Recommendations for follow up therapy are one component of a multi-disciplinary discharge planning process, led by the attending physician.  Recommendations may be updated based on patient status, additional functional criteria and insurance authorization.  Follow Up Recommendations  Skilled nursing-short term rehab (<3 hours/day) Can patient physically be transported by private vehicle: Yes   Assistance Recommended at Discharge Intermittent Supervision/Assistance  Patient can return home with the following A little help with walking and/or transfers;A little help with bathing/dressing/bathroom;Assistance with cooking/housework;Assist for transportation;Help with stairs or ramp for entrance   Equipment Recommendations  None recommended by PT    Recommendations for Other Services       Precautions / Restrictions Precautions Precautions: Shoulder Type of Shoulder Precautions: NO AROM/PROM L shoulder Shoulder Interventions: Shoulder sling/immobilizer Required Braces or Orthoses: Sling Restrictions Weight Bearing  Restrictions: Yes RUE Weight Bearing: Weight bearing as tolerated LUE Weight Bearing: Non weight bearing     Mobility  Bed Mobility Overal bed mobility: Needs Assistance Bed Mobility: Sit to Supine       Sit to supine: Min assist   General bed mobility comments: min A for BLEs into bed    Transfers Overall transfer level: Needs assistance Equipment used: None Transfers: Sit to/from Stand Sit to Stand: Min guard           General transfer comment: min guard to stand, but needed IV pole to ambulate    Ambulation/Gait Ambulation/Gait assistance: Min guard Gait Distance (Feet): 160 Feet Assistive device: IV Pole Gait Pattern/deviations: Step-through pattern, Decreased stride length Gait velocity: WNL     General Gait Details: steady, no loss of balance, incontinent of urine upon standing   Stairs             Wheelchair Mobility    Modified Rankin (Stroke Patients Only)       Balance Overall balance assessment: Needs assistance Sitting-balance support: No upper extremity supported Sitting balance-Leahy Scale: Good     Standing balance support: Single extremity supported Standing balance-Leahy Scale: Fair                              Cognition Arousal/Alertness: Awake/alert Behavior During Therapy: WFL for tasks assessed/performed Overall Cognitive Status: Within Functional Limits for tasks assessed                                          Exercises      General Comments        Pertinent Vitals/Pain Pain Assessment Pain Score: 0-No pain Pain Descriptors / Indicators:  Discomfort, Guarding    Home Living                          Prior Function            PT Goals (current goals can now be found in the care plan section) Acute Rehab PT Goals Patient Stated Goal: to go to rehab PT Goal Formulation: With patient/family Time For Goal Achievement: 11/17/21 Potential to Achieve Goals:  Good Progress towards PT goals: Progressing toward goals    Frequency    Min 2X/week      PT Plan Current plan remains appropriate    Co-evaluation              AM-PAC PT "6 Clicks" Mobility   Outcome Measure  Help needed turning from your back to your side while in a flat bed without using bedrails?: A Little Help needed moving from lying on your back to sitting on the side of a flat bed without using bedrails?: A Little Help needed moving to and from a bed to a chair (including a wheelchair)?: A Little Help needed standing up from a chair using your arms (e.g., wheelchair or bedside chair)?: A Little Help needed to walk in hospital room?: A Little Help needed climbing 3-5 steps with a railing? : A Lot 6 Click Score: 17    End of Session Equipment Utilized During Treatment: Gait belt Activity Tolerance: Patient tolerated treatment well Patient left: in bed;with bed alarm set;with call bell/phone within reach Nurse Communication: Mobility status PT Visit Diagnosis: Unsteadiness on feet (R26.81);Muscle weakness (generalized) (M62.81);History of falling (Z91.81)     Time: 1421-1440 PT Time Calculation (min) (ACUTE ONLY): 19 min  Charges:  $Gait Training: 8-22 mins                     Blondell Reveal Kistler PT 11/07/2021  Acute Rehabilitation Services  Office [phone]

## 2021-11-07 NOTE — Assessment & Plan Note (Addendum)
-   patient has chronic tachycardia in the 90s per son - currently multifactorial for transient elevation at this time: anemia, pain, acute illness, deconditioning -Overall, heart rate has down trended and remained stable

## 2021-11-07 NOTE — Progress Notes (Addendum)
Progress Note    Cheryl Salas   KVQ:[id number]  DOB: 1923/09/25  DOA: 11/02/2021     4 PCP: Gayland Curry, DO  Initial CC: fall at home  Hospital Course: Cheryl Salas is a 88 y.o. F with PMH DMII, CKD IIIb, hx diverticulitis s/p ileostomy, and hx bladder/breast cancer with bilateral mastectomy wh presented with left arm pain after a mechanical fall.    Workup was notable for a proximal left humeral fracture with angulation and lateral displacement.  She was evaluated by orthopedic surgery and underwent ORIF for repair on 11/06/2021.  Interval History:  No events overnight.  Son present bedside this morning.  She tolerated surgery well yesterday; still having nerve block symptoms today but is comfortable when seen.  Update given and questions answered.   Assessment and Plan: * Left humeral fracture - s/p mechanical fall; uses a walker at baseline but does have rather high functional baseline - imaging showed "Acute surgical neck proximal left humeral fracture with mild posteromedial angulation, anterior and lateral displacement of the distal fragment, and cephalad displacement with the proximal margin of the distal fragment having extended into the anterior deltoid muscle with swelling in the muscle and overlying ecchymosis." - s/p ORIF on 8/31 - plan is for SNF likely over the weekend if able  - outpt followup planned for 9/13  Ileostomy status ([place]) - ostomy in place; patient able to manage at home independently - likely in postop phase she will need assistance with this and SNF would be appropriate   Left hip pain - Seems over the trochanter.  No pain with ROM of the left hip.  No bruising to suggest hematoma. - xray obtained on 8/29 shows old healed B/L superior and inferior pubic rami fractures; diffuse decreased bone mineralization noted  - will start her on Ca + Vit D as well   Iron deficiency anemia - Hgb slightly down; likely from large bruising on left shoulder -  baseline ~10 - 11 g/dL - monitor Hgb post op  Sinus tachycardia - patient has chronic tachycardia in the 90s per son - currently multifactorial for transient elevation at this time: anemia, pain, acute illness, deconditioning - continue more frequent vitals checks; if sustains HR>115 will consider low dose BB while she continues to recover but would not expect to be needed long term   Type 2 diabetes mellitus with complication, without long-term current use of insulin (HCC) A1c 7.8%. Glucose normal here - Continue SS corrections - Hold Jardiance  Stage 3b chronic kidney disease (CKD) (HCC) Baseline Cr 1.2-1.6, stable  1st degree AV block - very mildly increased PR interval; old EKGs reviewed and present as well - patient asymptomatic  - no further workup at this time - prior echo reviewed from March 2023 as well; preserved EF and mild LVH; no RWMA  Hypomagnesemia - Supplement Mag  Hyperlipidemia - no prior lipid panel noted - has been on crestor 10 mg outpatient as of recently - given age and risk/benefits, not sure the mortality benefit of continuing statin; also discussed with her son  - would consider discontinuation of statin at outpatient follow up  Essential hypertension, ruled out - confirmed with patient and son that she is not on any anti-hypertensives at this time - continue monitoring BP and should it uptrend, would likely start ACEi or ARB as initial therapy (CKD not a contraindication)   Old records reviewed in assessment of this patient  Antimicrobials:   DVT prophylaxis:  SCD's Start:  11/06/21 1826 enoxaparin (LOVENOX) injection 30 mg Start: 11/03/21 1000   Code Status:   Code Status: Full Code  Mobility Assessment (last 72 hours)     Mobility Assessment     Row Name 11/07/21 1457 11/07/21 1119 11/07/21 0816 11/06/21 2100 11/06/21 0830   Does patient have an order for bedrest or is patient medically unstable -- -- No - Continue assessment No -  Continue assessment No - Continue assessment   What is the highest level of mobility based on the progressive mobility assessment? Level 5 (Walks with assist in room/hall) - Balance while stepping forward/back and can walk in room with assist - Complete Level 5 (Walks with assist in room/hall) - Balance while stepping forward/back and can walk in room with assist - Complete Level 2 (Chair[place]) - Balance while sitting on edge of bed and cannot stand Level 2 (Chairfast) - Balance while sitting on edge of bed and cannot stand Level 5 (Walks with assist in room/hall) - Balance while stepping forward/back and can walk in room with assist - Complete    Row Name 11/05/21 2155 11/05/21 1337 11/05/21 0822 11/05/21 0807 11/05/21 0100   Does patient have an order for bedrest or is patient medically unstable No - Continue assessment -- No - Continue assessment No - Continue assessment No - Continue assessment   What is the highest level of mobility based on the progressive mobility assessment? Level 5 (Walks with assist in room/hall) - Balance while stepping forward/back and can walk in room with assist - Complete Level 5 (Walks with assist in room/hall) - Balance while stepping forward/back and can walk in room with assist - Complete Level 5 (Walks with assist in room/hall) - Balance while stepping forward/back and can walk in room with assist - Complete Level 5 (Walks with assist in room/hall) - Balance while stepping forward/back and can walk in room with assist - Complete Level 5 (Walks with assist in room/hall) - Balance while stepping forward/back and can walk in room with assist - Complete            Barriers to discharge:  Disposition Plan:  SNF Status is: Inpt  Objective: Blood pressure 127/65, pulse 66, temperature 98.8 F (37.1 C), temperature source Oral, resp. rate 18, height '4\' 11"'$  (1.499 m), weight 58.3 kg, SpO2 97 %.  Examination:  Physical Exam Constitutional:      General: She is not in  acute distress.    Appearance: Normal appearance. She is not ill-appearing.  HENT:     Head: Normocephalic and atraumatic.     Mouth/Throat:     Mouth: Mucous membranes are moist.  Eyes:     Extraocular Movements: Extraocular movements intact.  Cardiovascular:     Rate and Rhythm: Normal rate and regular rhythm.  Pulmonary:     Effort: Pulmonary effort is normal. No respiratory distress.     Breath sounds: Normal breath sounds.  Abdominal:     General: Bowel sounds are normal. There is no distension.     Palpations: Abdomen is soft.     Tenderness: There is no abdominal tenderness.  Musculoskeletal:     Cervical back: Normal range of motion and neck supple.     Comments: LUE in sling; shoulder noted with bruising. Does have hand grip strength in left hand  Neurological:     Mental Status: She is alert.     Comments: Sensation intact in LUE  Psychiatric:        Mood and  Affect: Mood normal.      Consultants:  Orthopedic surgery  Procedures:  Open reduction and internal fixation of severely displaced left 2 part proximal humerus fracture, 11/06/21  Data Reviewed: Results for orders placed or performed during the hospital encounter of 11/02/21 (from the past 24 hour(s))  Glucose, capillary     Status: Abnormal   Collection Time: 11/06/21  5:39 PM  Result Value Ref Range   Glucose-Capillary 182 (H) 70 - 99 mg/dL  Glucose, capillary     Status: Abnormal   Collection Time: 11/06/21  9:46 PM  Result Value Ref Range   Glucose-Capillary 432 (H) 70 - 99 mg/dL  Glucose, random     Status: Abnormal   Collection Time: 11/06/21 10:12 PM  Result Value Ref Range   Glucose, Bld 464 (H) 70 - 99 mg/dL  Glucose, capillary     Status: Abnormal   Collection Time: 11/07/21  1:05 AM  Result Value Ref Range   Glucose-Capillary 362 (H) 70 - 99 mg/dL  CBC with Differential/Platelet     Status: Abnormal   Collection Time: 11/07/21  3:34 AM  Result Value Ref Range   WBC 6.6 4.0 - 10.5 K/uL    RBC 2.72 (L) 3.87 - 5.11 MIL/uL   Hemoglobin 8.5 (L) 12.0 - 15.0 g/dL   HCT 26.7 (L) 36.0 - 46.0 %   MCV 98.2 80.0 - 100.0 fL   MCH 31.3 26.0 - 34.0 pg   MCHC 31.8 30.0 - 36.0 g/dL   RDW 13.9 11.5 - 15.5 %   Platelets 185 150 - 400 K/uL   nRBC 0.0 0.0 - 0.2 %   Neutrophils Relative % 78 %   Neutro Abs 5.1 1.7 - 7.7 K/uL   Lymphocytes Relative 15 %   Lymphs Abs 1.0 0.7 - 4.0 K/uL   Monocytes Relative 6 %   Monocytes Absolute 0.4 0.1 - 1.0 K/uL   Eosinophils Relative 0 %   Eosinophils Absolute 0.0 0.0 - 0.5 K/uL   Basophils Relative 0 %   Basophils Absolute 0.0 0.0 - 0.1 K/uL   Immature Granulocytes 1 %   Abs Immature Granulocytes 0.04 0.00 - 0.07 K/uL  Basic metabolic panel     Status: Abnormal   Collection Time: 11/07/21  3:34 AM  Result Value Ref Range   Sodium 137 135 - 145 mmol/L   Potassium 4.5 3.5 - 5.1 mmol/L   Chloride 109 98 - 111 mmol/L   CO2 20 (L) 22 - 32 mmol/L   Glucose, Bld 241 (H) 70 - 99 mg/dL   BUN 33 (H) 8 - 23 mg/dL   Creatinine, Ser 1.45 (H) 0.44 - 1.00 mg/dL   Calcium 9.0 8.9 - 10.3 mg/dL   GFR, Estimated 33 (L) >60 mL/min   Anion gap 8 5 - 15  Magnesium     Status: None   Collection Time: 11/07/21  3:34 AM  Result Value Ref Range   Magnesium 1.7 1.7 - 2.4 mg/dL  Glucose, capillary     Status: Abnormal   Collection Time: 11/07/21  4:14 AM  Result Value Ref Range   Glucose-Capillary 199 (H) 70 - 99 mg/dL  Glucose, capillary     Status: Abnormal   Collection Time: 11/07/21  5:58 AM  Result Value Ref Range   Glucose-Capillary 150 (H) 70 - 99 mg/dL  Glucose, capillary     Status: Abnormal   Collection Time: 11/07/21  7:40 AM  Result Value Ref Range   Glucose-Capillary 143 (H)  70 - 99 mg/dL  Glucose, capillary     Status: Abnormal   Collection Time: 11/07/21 12:00 PM  Result Value Ref Range   Glucose-Capillary 234 (H) 70 - 99 mg/dL    I have Reviewed nursing notes, Vitals, and Lab results since pt's last encounter. Pertinent lab results : see  above I have ordered test including BMP, CBC, Mg I have reviewed the last note from staff over past 24 hours I have discussed pt's care plan and test results with nursing staff, case manager   LOS: 4 days   Dwyane Dee, MD [facility] 11/07/2021, 3:51 PM

## 2021-11-07 NOTE — TOC Progression Note (Addendum)
Transition of Care [facility]) - Progression Note   Patient Details  Name: Cheryl Salas MRN: [id number] Date of Birth: May 20, 1923  Transition of [facility]) CM/SW [place], LCSW Phone Number: 11/07/2021, 1:23 PM  Clinical Narrative: Patient is not medically ready today for discharge to [facility] SNF. CSW updated [place] in admissions as patient is now expected to be a weekend discharge. CSW updated son that the facility is aware the patient will not discharge today. TOC to follow.  Expected Discharge Plan: [place] Barriers to Discharge: Continued Medical Work up, SNF Pending bed offer  Expected Discharge Plan and Services Expected Discharge Plan: [place] In-house Referral: Clinical Social Work Post Acute Care Choice: [place] Living arrangements for the past 2 months: Single Family Home           DME Arranged: N/A DME Agency: NA  Readmission Risk Interventions     No data to display

## 2021-11-07 NOTE — Progress Notes (Signed)
Occupational Therapy Treatment Patient Details Name: Cheryl Salas MRN: [id number] DOB: 1923/04/03 Today's Date: 11/07/2021   History of present illness Cheryl Salas is a 88 y.o. female with medical history significant for essential hypertension, type 2 diabetes, hyperlipidemia, CKD 3B, prior diverticulitis status post ileostomy, hysterectomy/bladder suspension, history of bladder/breast cancer with bilateral mastectomy, ambulatory dysfunction with use of a walker, who presented to [facility] ED from home with complaints of left shoulder pain after a mechanical fall on 11/02/21 and sustained left displaced  humeral neck fracture. patient now s/p ORIF   OT comments  Treatment focused on reiteration of shoulder precautions and weightbearing status, advancing functional mobility and ADLs. Patient able to ambulate with min assist while holding onto IV pole and needed verbal cues for safety. Patient able to complete toileting with mod assist needing assistance for colostomy care. Patient able to perform pericare and washing of her upper legs. Patient progressing well. Continue to recommend short term rehab at discharge.    Recommendations for follow up therapy are one component of a multi-disciplinary discharge planning process, led by the attending physician.  Recommendations may be updated based on patient status, additional functional criteria and insurance authorization.    Follow Up Recommendations  Skilled nursing-short term rehab (<3 hours/day)    Assistance Recommended at Discharge Frequent or constant Supervision/Assistance  Patient can return home with the following  Two people to help with walking and/or transfers;Two people to help with bathing/dressing/bathroom;Direct supervision/assist for financial management;Help with stairs or ramp for entrance;Assist for transportation;Direct supervision/assist for medications management;Assistance with cooking/housework   Equipment Recommendations  Other  (comment) (defer to next venue)    Recommendations for Other Services      Precautions / Restrictions Precautions Precautions: Shoulder Type of Shoulder Precautions: NO AROM/PROM Shoulder Interventions: Shoulder sling/immobilizer Required Braces or Orthoses: Sling Restrictions Weight Bearing Restrictions: Yes RUE Weight Bearing: Non weight bearing LUE Weight Bearing: Non weight bearing       Mobility Bed Mobility Overal bed mobility: Needs Assistance Bed Mobility: Supine to Sit     Supine to sit: Mod assist     General bed mobility comments: mod assit to get hips EOB trunk negotiation    Transfers Overall transfer level: Needs assistance Equipment used: None Transfers: Sit to/from Stand Sit to Stand: Min guard           General transfer comment: min guard to stand, but needed IV pole to ambulate and with one loss of balance     Balance Overall balance assessment: Needs assistance Sitting-balance support: No upper extremity supported Sitting balance-Leahy Scale: Good     Standing balance support: Single extremity supported                               ADL either performed or assessed with clinical judgement   ADL       Grooming: Wash/dry hands;Min guard;Standing Grooming Details (indicate cue type and reason): at sink     Lower Body Bathing: Moderate assistance;Min guard Lower Body Bathing Details (indicate cue type and reason): pt sitting on toliet able to wash upper thighs and pericare         Toilet Transfer: Minimal assistance Toilet Transfer Details (indicate cue type and reason): verbal ques for hand placements and safety Toileting- Clothing Manipulation and Hygiene: Moderate assistance Toileting - Clothing Manipulation Details (indicate cue type and reason): needs assist with colostomy care due to impaired upper extremity  Functional mobility during ADLs: Minimal assistance General ADL Comments: ambulated holding onto IV  pole for balance and steading    Extremity/Trunk Assessment Upper Extremity Assessment Upper Extremity Assessment: LUE deficits/detail;RUE deficits/detail RUE Deficits / Details: Within function ROM and strength LUE Deficits / Details: impaired by mobilization and block   Lower Extremity Assessment Lower Extremity Assessment: Defer to PT evaluation        Vision Patient Visual Report: No change from baseline     Perception     Praxis      Cognition Arousal/Alertness: Awake/alert Behavior During Therapy: WFL for tasks assessed/performed Overall Cognitive Status: Within Functional Limits for tasks assessed                                          Exercises      Shoulder Instructions       General Comments      Pertinent Vitals/ Pain       Pain Assessment Pain Assessment: Faces Faces Pain Scale: Hurts little more Pain Location: pain in R hip; grimacing  Home Living                                          Prior Functioning/Environment              Frequency  Min 2X/week        Progress Toward Goals  OT Goals(current goals can now be found in the care plan section)  Progress towards OT goals: Progressing toward goals  Acute Rehab OT Goals Patient Stated Goal: go to the bathroom by herself OT Goal Formulation: With patient/family Time For Goal Achievement: 11/17/21 Potential to Achieve Goals: Good  Plan      Co-evaluation          OT goals addressed during session: ADL's and self-care      AM-PAC OT "6 Clicks" Daily Activity     Outcome Measure   Help from another person eating meals?: A Little Help from another person taking care of personal grooming?: A Little Help from another person toileting, which includes using toliet, bedpan, or urinal?: A Lot Help from another person bathing (including washing, rinsing, drying)?: A Lot Help from another person to put on and taking off regular upper body  clothing?: A Lot Help from another person to put on and taking off regular lower body clothing?: A Lot 6 Click Score: 14    End of Session Equipment Utilized During Treatment: Gait belt  OT Visit Diagnosis: Unsteadiness on feet (R26.81);Other abnormalities of gait and mobility (R26.89);Pain   Activity Tolerance Patient tolerated treatment well   Patient Left in chair;with family/visitor present   Nurse Communication Other (comment) (IV came apart, notified nurse)        Time: 0931-1000 OT Time Calculation (min): 29 min  Charges: OT General Charges $OT Visit: 1 Visit OT Treatments $Self Care/Home Management : 23-37 mins  Charlann Lange, OTS Acute rehab services   Charlann Lange 11/07/2021, 11:24 AM

## 2021-11-07 NOTE — Discharge Instructions (Signed)
   Metta Clines. Shahzain Kiester, M.D., F.A.A.O.S. [facility] Arthroscopic and Reconstructive Surgery of the Shoulder [phone] [address] [place], [place] [address] - Fax [phone]   POST-OP SHOULDER  INSTRUCTIONS  1. Follow up in the office for your first post-op appointment  on 9/13 at 2:00.   2. The bandage over your incision is waterproof. You may begin showering with this dressing on. You may leave this dressing on until first follow up appointment within 2 weeks. We prefer you leave this dressing in place until follow up however after 5-7 days if you are having itching or skin irritation and would like to remove it you may do so. Go slow and tug at the borders gently to break the bond the dressing has with the skin. At this point if there is no drainage it is okay to go without a bandage or you may cover it with a light guaze and tape. You can also expect significant bruising around your shoulder that will drift down your arm and into your chest wall. This is very normal and should resolve over several days.   3. Wear your sling/immobilizer at all times except to perform the exercises below or to occasionally let your arm dangle by your side to stretch your elbow. You also need to sleep in your sling immobilizer until instructed otherwise. It is ok to remove your sling if you are sitting in a controlled environment and allow your arm to rest in a position of comfort by your side or on your lap with pillows to give your neck and skin a break from the sling. You may remove it to allow arm to dangle by side to shower. If you are up walking around and when you go to sleep at night you need to wear it.  4. Range of motion to your elbow, wrist, and hand are encouraged 3-5 times daily. Exercise to your hand and fingers helps to reduce swelling you may experience.   For additional questions or concerns, please do not hesitate to call the office. If after hours there  is an answering service to forward your concerns to the physician on call.  Pain control following an exparel block  To help control your post-operative pain you received a nerve block  performed with Exparel which is a long acting anesthetic (numbing agent) which can provide pain relief and sensations of numbness (and relief of pain) in the operative shoulder and arm for up to 3 days. Sometimes it provides mixed relief, meaning you may still have numbness in certain areas of the arm but can still be able to move  parts of that arm, hand, and fingers. We recommend that your prescribed pain medications  be used as needed. We do not feel it is necessary to "pre medicate" and "stay ahead" of pain.  Taking narcotic pain medications when you are not having any pain can lead to unnecessary and potentially dangerous side effects.         POST-OP EXERCISES  No exercises to shoulder , but ok to allow arm to dangle for hygiene and move elbow, wrist and hand

## 2021-11-07 NOTE — Plan of Care (Signed)
  Problem: Education: Goal: Knowledge of General Education information will improve Description Including pain rating scale, medication(s)/side effects and non-pharmacologic comfort measures Outcome: Progressing   

## 2021-11-07 NOTE — Progress Notes (Signed)
Cheryl Salas  MRN: [id number] DOB/Age: [birth year] 88 y.o. Physician: Ander Slade, M.D. 1 Day Post-Op Procedure(s) (LRB): OPEN REDUCTION INTERNAL FIXATION (ORIF) PROXIMAL HUMERUS FRACTURE (Left)  Subjective: Resting comfortably in bedside chair.  Denies any shoulder pain.  son is at bedside. Vital Signs Temp:  [97.3 F (36.3 C)-98.8 F (37.1 C)] 98.8 F (37.1 C) (09/01 1210) Pulse Rate:  [66-113] 66 (09/01 1210) Resp:  [16-21] 18 (09/01 1210) BP: (114-141)/(62-94) 127/65 (09/01 1210) SpO2:  [95 %-100 %] 97 % (09/01 1210)  Lab Results Recent Labs    11/06/21 0334 11/07/21 0334  WBC 7.8 6.6  HGB 9.0* 8.5*  HCT 27.7* 26.7*  PLT 181 185   BMET Recent Labs    11/06/21 0334 11/06/21 2212 11/07/21 0334  NA 136  --  137  K 4.8  --  4.5  CL 108  --  109  CO2 24  --  20*  GLUCOSE 276* 464* 241*  BUN 33*  --  33*  CREATININE 1.38*  --  1.45*  CALCIUM 8.9  --  9.0   No results found for: "INR"   Exam  Left shoulder left upper arm does demonstrate broad areas of resolving ecchymosis as noted preoperatively.  Mild diffuse swelling persist.  Continues with dense effects from the interscalene nerve block.  No active wrist or digital extension and diffuse dysesthesias noted on exam today.  Dressing is intact with no obvious drainage into the dressing.  Plan Discussed operative findings with patient and her son.  At this time continue with sling immobilization.  Maintain strict nonweightbearing to the left upper extremity.  She may gently dangle her arm at her side.  Encouraged her for gentle motion of the elbow, wrist, and hand.  No active motion of the shoulder.  Left arm may be placed on the pillow for comfort.  Discussed monitoring skin status in the first webspace as she uses the thumb strap on her arms sling.  We will plan to see her back in the office Wednesday, September 13 at 2 PM and then have a follow-up with Dr. Lurena Nida clinic later that afternoon. Diahn Waidelich M  Nickolus Wadding 11/07/2021, 1:50 PM   Contact # [phone]

## 2021-11-08 DIAGNOSIS — D62 Acute posthemorrhagic anemia: Secondary | ICD-10-CM | POA: Diagnosis not present

## 2021-11-08 DIAGNOSIS — S42212A Unspecified displaced fracture of surgical neck of left humerus, initial encounter for closed fracture: Secondary | ICD-10-CM | POA: Diagnosis not present

## 2021-11-08 LAB — CBC WITH DIFFERENTIAL/PLATELET
Abs Immature Granulocytes: 0.04 10*3/uL (ref 0.00–0.07)
Basophils Absolute: 0 10*3/uL (ref 0.0–0.1)
Basophils Relative: 1 %
Eosinophils Absolute: 0.1 10*3/uL (ref 0.0–0.5)
Eosinophils Relative: 1 %
HCT: 23.1 % — ABNORMAL LOW (ref 36.0–46.0)
Hemoglobin: 7.3 g/dL — ABNORMAL LOW (ref 12.0–15.0)
Immature Granulocytes: 1 %
Lymphocytes Relative: 28 %
Lymphs Abs: 2.4 10*3/uL (ref 0.7–4.0)
MCH: 31.7 pg (ref 26.0–34.0)
MCHC: 31.6 g/dL (ref 30.0–36.0)
MCV: 100.4 fL — ABNORMAL HIGH (ref 80.0–100.0)
Monocytes Absolute: 0.6 10*3/uL (ref 0.1–1.0)
Monocytes Relative: 7 %
Neutro Abs: 5.2 10*3/uL (ref 1.7–7.7)
Neutrophils Relative %: 62 %
Platelets: 168 10*3/uL (ref 150–400)
RBC: 2.3 MIL/uL — ABNORMAL LOW (ref 3.87–5.11)
RDW: 14 % (ref 11.5–15.5)
WBC: 8.3 10*3/uL (ref 4.0–10.5)
nRBC: 0 % (ref 0.0–0.2)

## 2021-11-08 LAB — BASIC METABOLIC PANEL
Anion gap: 6 (ref 5–15)
BUN: 33 mg/dL — ABNORMAL HIGH (ref 8–23)
CO2: 25 mmol/L (ref 22–32)
Calcium: 9.2 mg/dL (ref 8.9–10.3)
Chloride: 110 mmol/L (ref 98–111)
Creatinine, Ser: 1.33 mg/dL — ABNORMAL HIGH (ref 0.44–1.00)
GFR, Estimated: 37 mL/min — ABNORMAL LOW (ref >60)
Glucose, Bld: 145 mg/dL — ABNORMAL HIGH (ref 70–99)
Potassium: 4.6 mmol/L (ref 3.5–5.1)
Sodium: 141 mmol/L (ref 135–145)

## 2021-11-08 LAB — HEMOGLOBIN AND HEMATOCRIT, BLOOD
HCT: 28 % — ABNORMAL LOW (ref 36.0–46.0)
Hemoglobin: 9 g/dL — ABNORMAL LOW (ref 12.0–15.0)

## 2021-11-08 LAB — ABO/RH: ABO/RH(D): B POS

## 2021-11-08 LAB — PREPARE RBC (CROSSMATCH)

## 2021-11-08 LAB — GLUCOSE, CAPILLARY
Glucose-Capillary: 141 mg/dL — ABNORMAL HIGH (ref 70–99)
Glucose-Capillary: 153 mg/dL — ABNORMAL HIGH (ref 70–99)
Glucose-Capillary: 198 mg/dL — ABNORMAL HIGH (ref 70–99)

## 2021-11-08 LAB — MAGNESIUM: Magnesium: 1.5 mg/dL — ABNORMAL LOW (ref 1.7–2.4)

## 2021-11-08 MED ORDER — SODIUM CHLORIDE 0.9% IV SOLUTION: Freq: Once | INTRAVENOUS | Status: AC

## 2021-11-08 MED ORDER — MAGNESIUM SULFATE 2 GM/50ML IV SOLN
2.0000 g | Freq: Once | INTRAVENOUS | Status: AC
  Administered 2021-11-08: 2 g via INTRAVENOUS
  Filled 2021-11-08: qty 50

## 2021-11-08 NOTE — Plan of Care (Signed)
  Problem: Education: Goal: Knowledge of General Education information will improve Description: Including pain rating scale, medication(s)/side effects and non-pharmacologic comfort measures Outcome: Progressing   Problem: Pain Managment: Goal: General experience of comfort will improve Outcome: Progressing   Problem: Safety: Goal: Ability to remain free from injury will improve Outcome: Progressing   

## 2021-11-08 NOTE — Progress Notes (Signed)
Cheryl Salas  MRN: [id number] DOB/Age: 11/26/1923 88 y.o. Physician: Ander Slade, M.D. 2 Days Post-Op Procedure(s) (LRB): OPEN REDUCTION INTERNAL FIXATION (ORIF) PROXIMAL HUMERUS FRACTURE (Left)  Subjective: Sitting comfortably in bed side chair, remains alert and oriented, denies shoulder pain. Vital Signs Temp:  [98.2 F (36.8 C)-98.8 F (37.1 C)] 98.4 F (36.9 C) (09/02 0513) Pulse Rate:  [66-103] 81 (09/02 0513) Resp:  [14-18] 16 (09/02 0513) BP: (115-141)/(65-96) 141/71 (09/02 0513) SpO2:  [97 %-100 %] 98 % (09/02 0513)  Lab Results Recent Labs    11/07/21 0334 11/08/21 0332  WBC 6.6 8.3  HGB 8.5* 7.3*  HCT 26.7* 23.1*  PLT 185 168   BMET Recent Labs    11/07/21 0334 11/08/21 0332  NA 137 141  K 4.5 4.6  CL 109 110  CO2 20* 25  GLUCOSE 241* 145*  BUN 33* 33*  CREATININE 1.45* 1.33*  CALCIUM 9.0 9.2   No results found for: "INR"   Exam  Body ecchymosis again noted about the left shoulder.  Dressing remains dry.  She has regained digital and wrist extension as the block has worn off.  Plan Continue with sling immobilization, nonweightbearing, activities as previously outlined.  Scheduled for follow-up in our office 9/13.  Son reports that medical service has recommended 1 unit transfusion.  Anticipate transfer to skilled nursing facility as medical condition and placement allow.   Cheryl Salas 11/08/2021, 10:03 AM   Contact # [phone]

## 2021-11-08 NOTE — Progress Notes (Signed)
Progress Note    Cheryl Salas   LOV:[id number]  DOB: [birth year]  DOA: 11/02/2021     5 PCP: Gayland Curry, DO  Initial CC: fall at home  Hospital Course: Cheryl Salas is a 88 y.o. F with PMH DMII, CKD IIIb, hx diverticulitis s/p ileostomy, and hx bladder/breast cancer with bilateral mastectomy wh presented with left arm pain after a mechanical fall.    Workup was notable for a proximal left humeral fracture with angulation and lateral displacement.  She was evaluated by orthopedic surgery and underwent ORIF for repair on 11/06/2021.  Interval History:  No events overnight.  Patient actually feeling a little better this morning.  More use of wrist and hand today.  Still ongoing bruising as expected noted in her left shoulder. Discussed hemoglobin findings with patient and her son bedside this morning.  They are in agreement for transfusion of 1 unit PRBC. We are anticipating probable discharge to [facility] tomorrow if hemoglobin stable.  Assessment and Plan: * Left humeral fracture - s/p mechanical fall; uses a walker at baseline but does have rather high functional baseline - imaging showed "Acute surgical neck proximal left humeral fracture with mild posteromedial angulation, anterior and lateral displacement of the distal fragment, and cephalad displacement with the proximal margin of the distal fragment having extended into the anterior deltoid muscle with swelling in the muscle and overlying ecchymosis." - s/p ORIF on 8/31 - plan is for SNF likely over the weekend if able  - outpt followup planned for 9/13  ABLA (acute blood loss anemia) - slow downtrend in Hgb since fall at home; has large bruising in left shoulder likely accounting for some gradual loss as well as recent surgery - HR is actually improved some this morning but Hgb down to 7.3 g/dL - given age and having started around 11 g/dL on admission, it's appropriate to go ahead and transfuse 1 unit PRBC today -  discussed with patient and son bedside this morning who are in agreement - repeat H/H after transfusion and again in am - if Hgb stable in am, should be good for d/c to [facility] at that time   Ileostomy status ([place]) - ostomy in place; patient able to manage at home independently - likely in postop phase she will need assistance with this and SNF would be appropriate   Left hip pain - Seems over the trochanter.  No pain with ROM of the left hip.  No bruising to suggest hematoma. - xray obtained on 8/29 shows old healed B/L superior and inferior pubic rami fractures; diffuse decreased bone mineralization noted  - will start her on Ca + Vit D as well   Iron deficiency anemia - Hgb slightly down; likely from large bruising on left shoulder - baseline ~10 - 11 g/dL - monitor Hgb post op  Sinus tachycardia - patient has chronic tachycardia in the 90s per son - currently multifactorial for transient elevation at this time: anemia, pain, acute illness, deconditioning - continue more frequent vitals checks; if sustains HR>115 will consider low dose BB while she continues to recover but would not expect to be needed long term   Type 2 diabetes mellitus with complication, without long-term current use of insulin (HCC) A1c 7.8%. Glucose normal here - Continue SS corrections - Hold Jardiance  Stage 3b chronic kidney disease (CKD) (HCC) Baseline Cr 1.2-1.6, stable  1st degree AV block - very mildly increased PR interval; old EKGs reviewed and present as well - patient asymptomatic  -  no further workup at this time - prior echo reviewed from March 2023 as well; preserved EF and mild LVH; no RWMA  Hypomagnesemia - Supplement Mag  Hyperlipidemia - no prior lipid panel noted - has been on crestor 10 mg outpatient as of recently - given age and risk/benefits, not sure the mortality benefit of continuing statin; also discussed with her son  - would consider discontinuation of statin at  outpatient follow up  Essential hypertension, ruled out - confirmed with patient and son that she is not on any anti-hypertensives at this time - continue monitoring BP and should it uptrend, would likely start ACEi or ARB as initial therapy (CKD not a contraindication)   Old records reviewed in assessment of this patient  Antimicrobials:   DVT prophylaxis:  SCD's Start: 11/06/21 1826 enoxaparin (LOVENOX) injection 30 mg Start: 11/03/21 1000   Code Status:   Code Status: Full Code  Mobility Assessment (last 72 hours)     Mobility Assessment     Row Name 11/08/21 0851 11/07/21 1946 11/07/21 1457 11/07/21 1119 11/07/21 0816   Does patient have an order for bedrest or is patient medically unstable No - Continue assessment No - Continue assessment -- -- No - Continue assessment   What is the highest level of mobility based on the progressive mobility assessment? Level 5 (Walks with assist in room/hall) - Balance while stepping forward/back and can walk in room with assist - Complete Level 5 (Walks with assist in room/hall) - Balance while stepping forward/back and can walk in room with assist - Complete Level 5 (Walks with assist in room/hall) - Balance while stepping forward/back and can walk in room with assist - Complete Level 5 (Walks with assist in room/hall) - Balance while stepping forward/back and can walk in room with assist - Complete Level 2 (Chairfast) - Balance while sitting on edge of bed and cannot stand    Row Name 11/06/21 2100 11/06/21 0830 11/05/21 2155 11/05/21 1337     Does patient have an order for bedrest or is patient medically unstable No - Continue assessment No - Continue assessment No - Continue assessment --    What is the highest level of mobility based on the progressive mobility assessment? Level 2 (Chairfast) - Balance while sitting on edge of bed and cannot stand Level 5 (Walks with assist in room/hall) - Balance while stepping forward/back and can walk in  room with assist - Complete Level 5 (Walks with assist in room/hall) - Balance while stepping forward/back and can walk in room with assist - Complete Level 5 (Walks with assist in room/hall) - Balance while stepping forward/back and can walk in room with assist - Complete             Barriers to discharge:  Disposition Plan:  SNF Status is: Inpt  Objective: Blood pressure (!) 141/71, pulse 81, temperature 98.4 F (36.9 C), temperature source Oral, resp. rate 16, height '4\' 11"'$  (1.499 m), weight 58.3 kg, SpO2 98 %.  Examination:  Physical Exam Constitutional:      General: She is not in acute distress.    Appearance: Normal appearance. She is not ill-appearing.  HENT:     Head: Normocephalic and atraumatic.     Mouth/Throat:     Mouth: Mucous membranes are moist.  Eyes:     Extraocular Movements: Extraocular movements intact.  Cardiovascular:     Rate and Rhythm: Normal rate and regular rhythm.  Pulmonary:     Effort: Pulmonary  effort is normal. No respiratory distress.     Breath sounds: Normal breath sounds.  Abdominal:     General: Bowel sounds are normal. There is no distension.     Palpations: Abdomen is soft.     Tenderness: There is no abdominal tenderness.  Musculoskeletal:     Cervical back: Normal range of motion and neck supple.     Comments: LUE in sling; shoulder noted with bruising. Does have hand grip strength in left hand  Neurological:     Mental Status: She is alert.     Comments: Sensation intact in LUE  Psychiatric:        Mood and Affect: Mood normal.      Consultants:  Orthopedic surgery  Procedures:  Open reduction and internal fixation of severely displaced left 2 part proximal humerus fracture, 11/06/21  Data Reviewed: Results for orders placed or performed during the hospital encounter of 11/02/21 (from the past 24 hour(s))  Glucose, capillary     Status: Abnormal   Collection Time: 11/07/21  5:19 PM  Result Value Ref Range    Glucose-Capillary 322 (H) 70 - 99 mg/dL  Glucose, capillary     Status: Abnormal   Collection Time: 11/07/21 10:11 PM  Result Value Ref Range   Glucose-Capillary 228 (H) 70 - 99 mg/dL  CBC with Differential/Platelet     Status: Abnormal   Collection Time: 11/08/21  3:32 AM  Result Value Ref Range   WBC 8.3 4.0 - 10.5 K/uL   RBC 2.30 (L) 3.87 - 5.11 MIL/uL   Hemoglobin 7.3 (L) 12.0 - 15.0 g/dL   HCT 23.1 (L) 36.0 - 46.0 %   MCV 100.4 (H) 80.0 - 100.0 fL   MCH 31.7 26.0 - 34.0 pg   MCHC 31.6 30.0 - 36.0 g/dL   RDW 14.0 11.5 - 15.5 %   Platelets 168 150 - 400 K/uL   nRBC 0.0 0.0 - 0.2 %   Neutrophils Relative % 62 %   Neutro Abs 5.2 1.7 - 7.7 K/uL   Lymphocytes Relative 28 %   Lymphs Abs 2.4 0.7 - 4.0 K/uL   Monocytes Relative 7 %   Monocytes Absolute 0.6 0.1 - 1.0 K/uL   Eosinophils Relative 1 %   Eosinophils Absolute 0.1 0.0 - 0.5 K/uL   Basophils Relative 1 %   Basophils Absolute 0.0 0.0 - 0.1 K/uL   Immature Granulocytes 1 %   Abs Immature Granulocytes 0.04 0.00 - 0.07 K/uL  Basic metabolic panel     Status: Abnormal   Collection Time: 11/08/21  3:32 AM  Result Value Ref Range   Sodium 141 135 - 145 mmol/L   Potassium 4.6 3.5 - 5.1 mmol/L   Chloride 110 98 - 111 mmol/L   CO2 25 22 - 32 mmol/L   Glucose, Bld 145 (H) 70 - 99 mg/dL   BUN 33 (H) 8 - 23 mg/dL   Creatinine, Ser 1.33 (H) 0.44 - 1.00 mg/dL   Calcium 9.2 8.9 - 10.3 mg/dL   GFR, Estimated 37 (L) >60 mL/min   Anion gap 6 5 - 15  Magnesium     Status: Abnormal   Collection Time: 11/08/21  3:32 AM  Result Value Ref Range   Magnesium 1.5 (L) 1.7 - 2.4 mg/dL  ABO/Rh     Status: None   Collection Time: 11/08/21  3:32 AM  Result Value Ref Range   ABO/RH(D)      B POS Performed at [facility],  [place] [address] [place], [place] [address]   Glucose, capillary     Status: Abnormal   Collection Time: 11/08/21  7:44 AM  Result Value Ref Range   Glucose-Capillary 141 (H) 70 - 99 mg/dL  Type and  screen [place]     Status: None (Preliminary result)   Collection Time: 11/08/21 10:40 AM  Result Value Ref Range   ABO/RH(D) B POS    Antibody Screen NEG    Sample Expiration 11/11/2021,2359    Unit Number [id number]    Blood Component Type RED CELLS,LR    Unit division 00    Status of Unit ALLOCATED    Transfusion Status OK TO TRANSFUSE    Crossmatch Result      Compatible Performed at [facility] [address] [place], [place] [address]   Prepare RBC (crossmatch)     Status: None   Collection Time: 11/08/21 10:40 AM  Result Value Ref Range   Order Confirmation      ORDER PROCESSED BY BLOOD BANK Performed at [facility] [address], [place], [place] [address]   Glucose, capillary     Status: Abnormal   Collection Time: 11/08/21 12:33 PM  Result Value Ref Range   Glucose-Capillary 153 (H) 70 - 99 mg/dL    I have Reviewed nursing notes, Vitals, and Lab results since pt's last encounter. Pertinent lab results : see above I have ordered test including BMP, CBC, Mg I have reviewed the last note from staff over past 24 hours I have discussed pt's care plan and test results with nursing staff, case manager   LOS: 5 days   Dwyane Dee, MD [facility] 11/08/2021, 1:10 PM

## 2021-11-08 NOTE — Progress Notes (Signed)
OT Cancellation Note  Patient Details Name: Cheryl Salas MRN: [id number] DOB: [birth year]   Cancelled Treatment:    Reason Eval/Treat Not Completed: Other (comment) Patient and son were approached this AM for UB dressing education per request. Patient was pending PRBCs at this time so session was scheduled for 3 pm today. Nurse made aware. After 3 pm patients blood transfusion was started and session was moved to tomorrow AM. OT to continue to follow Rennie Plowman, MS Acute Rehabilitation Department Office# [phone]  11/08/2021, 3:38 PM

## 2021-11-08 NOTE — Assessment & Plan Note (Addendum)
-   slow downtrend in Hgb since fall at home; has large bruising in left shoulder likely accounting for some gradual loss as well as recent surgery - HR is actually improved some this morning but Hgb down to 7.3 g/dL - given age and having started around 11 g/dL on admission, it's appropriate to go ahead and transfuse 1 unit PRBC 11/08/21 - Hgb improved and stable at discharge, 9.7 g/dL

## 2021-11-08 NOTE — Plan of Care (Signed)
  Problem: Education: Goal: Knowledge of General Education information will improve Description Including pain rating scale, medication(s)/side effects and non-pharmacologic comfort measures Outcome: Progressing   

## 2021-11-09 DIAGNOSIS — D62 Acute posthemorrhagic anemia: Secondary | ICD-10-CM | POA: Diagnosis not present

## 2021-11-09 DIAGNOSIS — G8929 Other chronic pain: Secondary | ICD-10-CM

## 2021-11-09 DIAGNOSIS — S42212A Unspecified displaced fracture of surgical neck of left humerus, initial encounter for closed fracture: Secondary | ICD-10-CM | POA: Diagnosis not present

## 2021-11-09 LAB — BASIC METABOLIC PANEL
Anion gap: 6 (ref 5–15)
BUN: 32 mg/dL — ABNORMAL HIGH (ref 8–23)
CO2: 25 mmol/L (ref 22–32)
Calcium: 8.8 mg/dL — ABNORMAL LOW (ref 8.9–10.3)
Chloride: 105 mmol/L (ref 98–111)
Creatinine, Ser: 1.35 mg/dL — ABNORMAL HIGH (ref 0.44–1.00)
GFR, Estimated: 36 mL/min — ABNORMAL LOW (ref >60)
Glucose, Bld: 230 mg/dL — ABNORMAL HIGH (ref 70–99)
Potassium: 4.1 mmol/L (ref 3.5–5.1)
Sodium: 136 mmol/L (ref 135–145)

## 2021-11-09 LAB — CBC WITH DIFFERENTIAL/PLATELET
Abs Immature Granulocytes: 0.05 10*3/uL (ref 0.00–0.07)
Basophils Absolute: 0.1 10*3/uL (ref 0.0–0.1)
Basophils Relative: 1 %
Eosinophils Absolute: 0.1 10*3/uL (ref 0.0–0.5)
Eosinophils Relative: 2 %
HCT: 28.5 % — ABNORMAL LOW (ref 36.0–46.0)
Hemoglobin: 9.1 g/dL — ABNORMAL LOW (ref 12.0–15.0)
Immature Granulocytes: 1 %
Lymphocytes Relative: 28 %
Lymphs Abs: 2.1 10*3/uL (ref 0.7–4.0)
MCH: 30.8 pg (ref 26.0–34.0)
MCHC: 31.9 g/dL (ref 30.0–36.0)
MCV: 96.6 fL (ref 80.0–100.0)
Monocytes Absolute: 0.5 10*3/uL (ref 0.1–1.0)
Monocytes Relative: 6 %
Neutro Abs: 4.6 10*3/uL (ref 1.7–7.7)
Neutrophils Relative %: 62 %
Platelets: 187 10*3/uL (ref 150–400)
RBC: 2.95 MIL/uL — ABNORMAL LOW (ref 3.87–5.11)
RDW: 14.7 % (ref 11.5–15.5)
WBC: 7.4 10*3/uL (ref 4.0–10.5)
nRBC: 0 % (ref 0.0–0.2)

## 2021-11-09 LAB — TYPE AND SCREEN
ABO/RH(D): B POS
Antibody Screen: NEGATIVE
Unit division: 0

## 2021-11-09 LAB — GLUCOSE, CAPILLARY
Glucose-Capillary: 212 mg/dL — ABNORMAL HIGH (ref 70–99)
Glucose-Capillary: 162 mg/dL — ABNORMAL HIGH (ref 70–99)
Glucose-Capillary: 142 mg/dL — ABNORMAL HIGH (ref 70–99)
Glucose-Capillary: 143 mg/dL — ABNORMAL HIGH (ref 70–99)
Glucose-Capillary: 200 mg/dL — ABNORMAL HIGH (ref 70–99)

## 2021-11-09 LAB — BPAM RBC
Blood Product Expiration Date: 202309202359
ISSUE DATE / TIME: 202309021442
Unit Type and Rh: 7300

## 2021-11-09 LAB — MAGNESIUM: Magnesium: 1.8 mg/dL (ref 1.7–2.4)

## 2021-11-09 MED ORDER — TRAMADOL HCL 50 MG PO TABS
100.0000 mg | ORAL_TABLET | Freq: Two times a day (BID) | ORAL | Status: DC
  Administered 2021-11-09 – 2021-11-10 (×3): 100 mg via ORAL
  Filled 2021-11-09 (×3): qty 2

## 2021-11-09 NOTE — Progress Notes (Signed)
Occupational Therapy Treatment Patient Details Name: Cheryl Salas MRN: [id number] DOB: 1923/07/19 Today's Date: 11/09/2021   History of present illness Cheryl Salas is a 88 y.o. female with medical history significant for essential hypertension, type 2 diabetes, hyperlipidemia, CKD 3B, prior diverticulitis status post ileostomy, hysterectomy/bladder suspension, history of bladder/breast cancer with bilateral mastectomy, ambulatory dysfunction with use of a walker, who presented to [facility] ED from home with complaints of left shoulder pain after a mechanical fall on 11/02/21 and sustained left displaced  humeral neck fracture, conservative management with sling. Ortho reconsulted and patined underwent surgery on 8/31 for ORIF.   OT comments  Patient and son were educated on UB and LB dressing and bathing with current ROM restrictions. Patient needed increased physical assistance and cues to relax shoulder during session. Patient was educated on ROM and WB restrictions and how to maintain them. Patient was educated on sling placement and proper alignment. Patient was educated on proper positioning of pillows in and out of bed with good carryover. Patient's discharge plan remains appropriate at this time. OT will continue to follow acutely.     Recommendations for follow up therapy are one component of a multi-disciplinary discharge planning process, led by the attending physician.  Recommendations may be updated based on patient status, additional functional criteria and insurance authorization.    Follow Up Recommendations  Skilled nursing-short term rehab (<3 hours/day)    Assistance Recommended at Discharge Frequent or constant Supervision/Assistance  Patient can return home with the following  Two people to help with walking and/or transfers;Two people to help with bathing/dressing/bathroom;Direct supervision/assist for financial management;Help with stairs or ramp for entrance;Assist for  transportation;Direct supervision/assist for medications management;Assistance with cooking/housework   Equipment Recommendations  Other (comment)    Recommendations for Other Services      Precautions / Restrictions Precautions Precautions: Shoulder Type of Shoulder Precautions: NO AROM/PROM L shoulder Shoulder Interventions: Shoulder sling/immobilizer Precaution Booklet Issued: Yes (comment) Precaution Comments: R arm sling, ostomy Required Braces or Orthoses: Sling Restrictions Weight Bearing Restrictions: Yes LUE Weight Bearing: Non weight bearing       Mobility Bed Mobility Overal bed mobility: Needs Assistance Bed Mobility: Sit to Supine   Sidelying to sit: Mod assist            Transfers                         Balance Overall balance assessment: Needs assistance Sitting-balance support: No upper extremity supported Sitting balance-Leahy Scale: Good       Standing balance-Leahy Scale: Fair                             ADL either performed or assessed with clinical judgement   ADL Overall ADL's : Needs assistance/impaired     Grooming: Set up;Wash/dry face;Wash/dry hands;Sitting     Upper Body Bathing Details (indicate cue type and reason): eduated patient and son on proper washing technique Lower Body Bathing: Moderate assistance;Sitting/lateral leans Lower Body Bathing Details (indicate cue type and reason): sitting Upper Body Dressing : Maximal assistance;Sitting;Cueing for UE precautions Upper Body Dressing Details (indicate cue type and reason): education provided to rest UE vs guarding it. Lower Body Dressing: Moderate assistance;Sit to/from stand   Toilet Transfer: Minimal assistance Toilet Transfer Details (indicate cue type and reason): with IV pole Toileting- Clothing Manipulation and Hygiene: Moderate assistance  Extremity/Trunk Assessment              Vision       Perception     Praxis       Cognition Arousal/Alertness: Awake/alert Behavior During Therapy: WFL for tasks assessed/performed Overall Cognitive Status: Within Functional Limits for tasks assessed                                          Exercises      Shoulder Instructions       General Comments      Pertinent Vitals/ Pain       Pain Assessment Pain Assessment: Faces Faces Pain Scale: Hurts even more Pain Location: pain in R hip; grimacing Pain Descriptors / Indicators: Discomfort, Guarding Pain Intervention(s): Limited activity within patient's tolerance, Monitored during session, Premedicated before session, Ice applied  Home Living                                          Prior Functioning/Environment              Frequency  Min 2X/week        Progress Toward Goals  OT Goals(current goals can now be found in the care plan section)  Progress towards OT goals: Progressing toward goals     Plan Discharge plan remains appropriate    Co-evaluation                 AM-PAC OT "6 Clicks" Daily Activity     Outcome Measure   Help from another person eating meals?: A Little Help from another person taking care of personal grooming?: A Little Help from another person toileting, which includes using toliet, bedpan, or urinal?: A Lot Help from another person bathing (including washing, rinsing, drying)?: A Lot Help from another person to put on and taking off regular upper body clothing?: A Lot Help from another person to put on and taking off regular lower body clothing?: A Lot 6 Click Score: 14    End of Session Equipment Utilized During Treatment: Gait belt  OT Visit Diagnosis: Unsteadiness on feet (R26.81);Other abnormalities of gait and mobility (R26.89);Pain Pain - Right/Left: Left Pain - part of body: Shoulder   Activity Tolerance Patient tolerated treatment well   Patient Left in chair;with family/visitor present   Nurse  Communication          Time: 5701-7793 OT Time Calculation (min): 43 min  Charges: OT General Charges $OT Visit: 1 Visit OT Treatments $Self Care/Home Management : 38-52 mins  Rennie Plowman, MS Acute Rehabilitation Department Office# [phone]   Marcellina Millin 11/09/2021, 12:49 PM

## 2021-11-09 NOTE — Plan of Care (Signed)
  Problem: Education: Goal: Ability to describe self-care measures that may prevent or decrease complications (Diabetes Survival Skills Education) will improve Outcome: Completed/Met   Problem: Activity: Goal: Risk for activity intolerance will decrease Outcome: Completed/Met   Problem: Pain Managment: Goal: General experience of comfort will improve Outcome: Completed/Met

## 2021-11-09 NOTE — TOC Progression Note (Signed)
Transition of Care [facility]) - Progression Note    Patient Details  Name: Chelsei Mcchesney MRN: [id number] Date of Birth: 04/28/1923  [facility] of [facility] CM/SW Contact  Ross Ludwig, [place] Phone Number: 11/09/2021, 12:10 PM  Clinical Narrative:     CSW was informed by physician that patient is not medically ready for discharge yet.  CSW contacted [place] at [facility] to see if patient is able to discharge to SNF on Monday if she is medically ready, per [place] yes as long as DC summary is completed before 1100 or 1200 so they can order meds in time.  CSW to continue to follow patient's progress throughout discharge planning.  Expected Discharge Plan: [place] Barriers to Discharge: Continued Medical Work up, SNF Pending bed offer  Expected Discharge Plan and Services Expected Discharge Plan: [place] In-house Referral: Clinical Social Work   Post Acute Care Choice: [place] Living arrangements for the past 2 months: Single Family Home                 DME Arranged: N/A DME Agency: NA                   Social Determinants of Health (SDOH) Interventions    Readmission Risk Interventions     No data to display

## 2021-11-09 NOTE — Assessment & Plan Note (Signed)
-   Good relief with tramadol at home typically.  Home dose is typically 1 to 2 tablets 3 times a day as needed - Okay to resume tramadol at this time.  Discussed with pharmacy and regimen will be renally adjusted for now - If further improvement in renal function in the future, should be able to readjust regimen at that time also

## 2021-11-09 NOTE — Progress Notes (Signed)
Progress Note    Cheryl Salas   IRS:[id number]  DOB: [birth year]  DOA: 11/02/2021     6 PCP: Gayland Curry, DO  Initial CC: fall at home  Hospital Course: Cheryl Salas is a 88 y.o. F with PMH DMII, CKD IIIb, hx diverticulitis s/p ileostomy, and hx bladder/breast cancer with bilateral mastectomy wh presented with left arm pain after a mechanical fall.    Workup was notable for a proximal left humeral fracture with angulation and lateral displacement.  She was evaluated by orthopedic surgery and underwent ORIF for repair on 11/06/2021.  Interval History:  No events overnight. Complaining of some back pain this morning. She typically takes tramadol at home with good relief. We discussed resuming this today.   Assessment and Plan: * Left humeral fracture - s/p mechanical fall; uses a walker at baseline but does have rather high functional baseline - imaging showed "Acute surgical neck proximal left humeral fracture with mild posteromedial angulation, anterior and lateral displacement of the distal fragment, and cephalad displacement with the proximal margin of the distal fragment having extended into the anterior deltoid muscle with swelling in the muscle and overlying ecchymosis." - s/p ORIF on 8/31 - plan is for [facility] on Monday  - outpt followup planned for 9/13  ABLA (acute blood loss anemia) - slow downtrend in Hgb since fall at home; has large bruising in left shoulder likely accounting for some gradual loss as well as recent surgery - HR is actually improved some this morning but Hgb down to 7.3 g/dL - given age and having started around 11 g/dL on admission, it's appropriate to go ahead and transfuse 1 unit PRBC 11/08/21 - Hgb improved to 9.1 g/dL this morning - repeat H/H again in am to confirm stability   Ileostomy status (Cheryl Salas) - ostomy in place; patient able to manage at home independently - likely in postop phase she will need assistance with this and SNF would be  appropriate   Left hip pain - Seems over the trochanter.  No pain with ROM of the left hip.  No bruising to suggest hematoma. - xray obtained on 8/29 shows old healed B/L superior and inferior pubic rami fractures; diffuse decreased bone mineralization noted  - will start her on Ca + Vit D as well   Iron deficiency anemia - Hgb slightly down; likely from large bruising on left shoulder - baseline ~10 - 11 g/dL - monitor Hgb post op  Sinus tachycardia - patient has chronic tachycardia in the 90s per son - currently multifactorial for transient elevation at this time: anemia, pain, acute illness, deconditioning -Overall, heart rate has down trended and remained stable  Type 2 diabetes mellitus with complication, without long-term current use of insulin (HCC) A1c 7.8%. Glucose normal here - Continue SS corrections - Hold Jardiance  Stage 3b chronic kidney disease (CKD) (HCC) Baseline Cr 1.2-1.6, stable  1st degree AV block - very mildly increased PR interval; old EKGs reviewed and present as well - patient asymptomatic  - no further workup at this time - prior echo reviewed from March 2023 as well; preserved EF and mild LVH; no RWMA  Chronic back pain - Good relief with tramadol at home typically.  Home dose is typically 1 to 2 tablets 3 times a day as needed - Okay to resume tramadol at this time.  Discussed with pharmacy and regimen will be renally adjusted for now - If further improvement in renal function in the future, should be able  to readjust regimen at that time also  Hypomagnesemia - Supplement Mag  Hyperlipidemia - no prior lipid panel noted - has been on crestor 10 mg outpatient as of recently - given age and risk/benefits, likely no further mortality benefit of continuing statin; also discussed with her son  - will go ahead and stop statin at this time  Essential hypertension, ruled out - confirmed with patient and son that she is not on any anti-hypertensives  at this time - continue monitoring BP and should it uptrend, would likely start ACEi or ARB as initial therapy (CKD not a contraindication)   Old records reviewed in assessment of this patient  Antimicrobials:   DVT prophylaxis:  SCD's Start: 11/06/21 1826 enoxaparin (LOVENOX) injection 30 mg Start: 11/03/21 1000   Code Status:   Code Status: Full Code  Mobility Assessment (last 72 hours)     Mobility Assessment     Row Name 11/08/21 0851 11/07/21 1946 11/07/21 1457 11/07/21 1119 11/07/21 0816   Does patient have an order for bedrest or is patient medically unstable No - Continue assessment No - Continue assessment -- -- No - Continue assessment   What is the highest level of mobility based on the progressive mobility assessment? Level 5 (Walks with assist in room/hall) - Balance while stepping forward/back and can walk in room with assist - Complete Level 5 (Walks with assist in room/hall) - Balance while stepping forward/back and can walk in room with assist - Complete Level 5 (Walks with assist in room/hall) - Balance while stepping forward/back and can walk in room with assist - Complete Level 5 (Walks with assist in room/hall) - Balance while stepping forward/back and can walk in room with assist - Complete Level 2 (Chairfast) - Balance while sitting on edge of bed and cannot stand    Row Name 11/06/21 2100           Does patient have an order for bedrest or is patient medically unstable No - Continue assessment       What is the highest level of mobility based on the progressive mobility assessment? Level 2 (Chairfast) - Balance while sitting on edge of bed and cannot stand                Barriers to discharge:  Disposition Plan:  SNF Status is: Inpt  Objective: Blood pressure (!) 166/79, pulse 74, temperature 98.3 F (36.8 C), resp. rate 16, height '4\' 11"'$  (1.499 m), weight 58.3 kg, SpO2 97 %.  Examination:  Physical Exam Constitutional:      General: She is not  in acute distress.    Appearance: Normal appearance. She is not ill-appearing.  HENT:     Head: Normocephalic and atraumatic.     Mouth/Throat:     Mouth: Mucous membranes are moist.  Eyes:     Extraocular Movements: Extraocular movements intact.  Cardiovascular:     Rate and Rhythm: Normal rate and regular rhythm.  Pulmonary:     Effort: Pulmonary effort is normal. No respiratory distress.     Breath sounds: Normal breath sounds.  Abdominal:     General: Bowel sounds are normal. There is no distension.     Palpations: Abdomen is soft.     Tenderness: There is no abdominal tenderness.     Comments: Ostomy bag in place with brown stool and air noted  Musculoskeletal:     Cervical back: Normal range of motion and neck supple.     Comments: LUE  in sling; shoulder noted with bruising. Does have hand grip strength in left hand  Neurological:     Mental Status: She is alert.     Comments: Sensation intact in LUE  Psychiatric:        Mood and Affect: Mood normal.      Consultants:  Orthopedic surgery  Procedures:  Open reduction and internal fixation of severely displaced left 2 part proximal humerus fracture, 11/06/21  Data Reviewed: Results for orders placed or performed during the hospital encounter of 11/02/21 (from the past 24 hour(s))  Glucose, capillary     Status: Abnormal   Collection Time: 11/08/21  3:53 PM  Result Value Ref Range   Glucose-Capillary 212 (H) 70 - 99 mg/dL  Hemoglobin and hematocrit, blood     Status: Abnormal   Collection Time: 11/08/21  8:06 PM  Result Value Ref Range   Hemoglobin 9.0 (L) 12.0 - 15.0 g/dL   HCT 28.0 (L) 36.0 - 46.0 %  Glucose, capillary     Status: Abnormal   Collection Time: 11/08/21 10:42 PM  Result Value Ref Range   Glucose-Capillary 198 (H) 70 - 99 mg/dL  CBC with Differential/Platelet     Status: Abnormal   Collection Time: 11/09/21  3:46 AM  Result Value Ref Range   WBC 7.4 4.0 - 10.5 K/uL   RBC 2.95 (L) 3.87 - 5.11  MIL/uL   Hemoglobin 9.1 (L) 12.0 - 15.0 g/dL   HCT 28.5 (L) 36.0 - 46.0 %   MCV 96.6 80.0 - 100.0 fL   MCH 30.8 26.0 - 34.0 pg   MCHC 31.9 30.0 - 36.0 g/dL   RDW 14.7 11.5 - 15.5 %   Platelets 187 150 - 400 K/uL   nRBC 0.0 0.0 - 0.2 %   Neutrophils Relative % 62 %   Neutro Abs 4.6 1.7 - 7.7 K/uL   Lymphocytes Relative 28 %   Lymphs Abs 2.1 0.7 - 4.0 K/uL   Monocytes Relative 6 %   Monocytes Absolute 0.5 0.1 - 1.0 K/uL   Eosinophils Relative 2 %   Eosinophils Absolute 0.1 0.0 - 0.5 K/uL   Basophils Relative 1 %   Basophils Absolute 0.1 0.0 - 0.1 K/uL   Immature Granulocytes 1 %   Abs Immature Granulocytes 0.05 0.00 - 0.07 K/uL  Basic metabolic panel     Status: Abnormal   Collection Time: 11/09/21  3:46 AM  Result Value Ref Range   Sodium 136 135 - 145 mmol/L   Potassium 4.1 3.5 - 5.1 mmol/L   Chloride 105 98 - 111 mmol/L   CO2 25 22 - 32 mmol/L   Glucose, Bld 230 (H) 70 - 99 mg/dL   BUN 32 (H) 8 - 23 mg/dL   Creatinine, Ser 1.35 (H) 0.44 - 1.00 mg/dL   Calcium 8.8 (L) 8.9 - 10.3 mg/dL   GFR, Estimated 36 (L) >60 mL/min   Anion gap 6 5 - 15  Magnesium     Status: None   Collection Time: 11/09/21  3:46 AM  Result Value Ref Range   Magnesium 1.8 1.7 - 2.4 mg/dL  Glucose, capillary     Status: Abnormal   Collection Time: 11/09/21  7:50 AM  Result Value Ref Range   Glucose-Capillary 162 (H) 70 - 99 mg/dL    I have Reviewed nursing notes, Vitals, and Lab results since pt's last encounter. Pertinent lab results : see above I have ordered test including BMP, CBC, Mg I have reviewed the  last note from staff over past 24 hours I have discussed pt's care plan and test results with nursing staff, case manager   LOS: 6 days   Dwyane Dee, MD [facility] 11/09/2021, 12:43 PM

## 2021-11-10 DIAGNOSIS — S42212A Unspecified displaced fracture of surgical neck of left humerus, initial encounter for closed fracture: Secondary | ICD-10-CM | POA: Diagnosis not present

## 2021-11-10 LAB — CBC WITH DIFFERENTIAL/PLATELET
Abs Immature Granulocytes: 0.03 10*3/uL (ref 0.00–0.07)
Basophils Absolute: 0 10*3/uL (ref 0.0–0.1)
Basophils Relative: 1 %
Eosinophils Absolute: 0.2 10*3/uL (ref 0.0–0.5)
Eosinophils Relative: 3 %
HCT: 30 % — ABNORMAL LOW (ref 36.0–46.0)
Hemoglobin: 9.7 g/dL — ABNORMAL LOW (ref 12.0–15.0)
Immature Granulocytes: 1 %
Lymphocytes Relative: 31 %
Lymphs Abs: 1.9 10*3/uL (ref 0.7–4.0)
MCH: 31.4 pg (ref 26.0–34.0)
MCHC: 32.3 g/dL (ref 30.0–36.0)
MCV: 97.1 fL (ref 80.0–100.0)
Monocytes Absolute: 0.4 10*3/uL (ref 0.1–1.0)
Monocytes Relative: 7 %
Neutro Abs: 3.5 10*3/uL (ref 1.7–7.7)
Neutrophils Relative %: 57 %
Platelets: 176 10*3/uL (ref 150–400)
RBC: 3.09 MIL/uL — ABNORMAL LOW (ref 3.87–5.11)
RDW: 14.6 % (ref 11.5–15.5)
WBC: 6.1 10*3/uL (ref 4.0–10.5)
nRBC: 0 % (ref 0.0–0.2)

## 2021-11-10 LAB — BASIC METABOLIC PANEL
Anion gap: 5 (ref 5–15)
BUN: 32 mg/dL — ABNORMAL HIGH (ref 8–23)
CO2: 25 mmol/L (ref 22–32)
Calcium: 8.9 mg/dL (ref 8.9–10.3)
Chloride: 107 mmol/L (ref 98–111)
Creatinine, Ser: 1.23 mg/dL — ABNORMAL HIGH (ref 0.44–1.00)
GFR, Estimated: 40 mL/min — ABNORMAL LOW (ref >60)
Glucose, Bld: 145 mg/dL — ABNORMAL HIGH (ref 70–99)
Potassium: 4.6 mmol/L (ref 3.5–5.1)
Sodium: 137 mmol/L (ref 135–145)

## 2021-11-10 LAB — MAGNESIUM: Magnesium: 1.7 mg/dL (ref 1.7–2.4)

## 2021-11-10 LAB — GLUCOSE, CAPILLARY
Glucose-Capillary: 142 mg/dL — ABNORMAL HIGH (ref 70–99)
Glucose-Capillary: 135 mg/dL — ABNORMAL HIGH (ref 70–99)

## 2021-11-10 MED ORDER — ACETAMINOPHEN 500 MG PO TABS: 1000.0000 mg | ORAL_TABLET | Freq: Three times a day (TID) | ORAL | 0 refills | Status: AC

## 2021-11-10 MED ORDER — LOPERAMIDE HCL 2 MG PO CAPS: 2.0000 mg | ORAL_CAPSULE | Freq: Three times a day (TID) | ORAL | 5 refills | Status: DC | PRN

## 2021-11-10 MED ORDER — DIPHENOXYLATE-ATROPINE 2.5-0.025 MG PO TABS: 1.0000 | ORAL_TABLET | Freq: Four times a day (QID) | ORAL | 0 refills | Status: AC | PRN

## 2021-11-10 MED ORDER — ZOLPIDEM TARTRATE 5 MG PO TABS: 5.0000 mg | ORAL_TABLET | Freq: Every evening | ORAL | 0 refills | Status: AC | PRN

## 2021-11-10 MED ORDER — OYSTER SHELL CALCIUM/D3 500-5 MG-MCG PO TABS: 1.0000 | ORAL_TABLET | Freq: Two times a day (BID) | ORAL | Status: AC

## 2021-11-10 MED ORDER — TRAMADOL HCL 50 MG PO TABS: ORAL_TABLET | ORAL | 0 refills | Status: AC

## 2021-11-10 NOTE — Discharge Summary (Signed)
Physician Discharge Summary   Cheryl Salas NWG:[id number] DOB: 1923/04/11 DOA: 11/02/2021  PCP: Gayland Curry, DO  Admit date: 11/02/2021 Discharge date: 11/10/2021 Barriers to discharge: none  Admitted From: Home Disposition:  [place] Discharging physician: Dwyane Dee, MD  Recommendations for Outpatient Follow-up:  Adjust tylenol and tramadol as needed Follow up with orthopedic surgery on 11/19/21  Home Health:  Equipment/Devices:   Discharge Condition: stable CODE STATUS: Full Diet recommendation:  Diet Orders (From admission, onward)     Start     Ordered   11/10/21 0000  Diet general        11/10/21 1030   11/07/21 1018  Diet regular Room service appropriate? Yes; Fluid consistency: Thin  Diet effective now       Question Answer Comment  Room service appropriate? Yes   Fluid consistency: Thin      11/07/21 1017            Hospital Course: Cheryl Salas is a 88 y.o. F with PMH DMII, CKD IIIb, hx diverticulitis s/p ileostomy, and hx bladder/breast cancer with bilateral mastectomy wh presented with left arm pain after a mechanical fall.    Workup was notable for a proximal left humeral fracture with angulation and lateral displacement.  She was evaluated by orthopedic surgery and underwent ORIF for repair on 11/06/2021.  Assessment and Plan: * Left humeral fracture - s/p mechanical fall; uses a walker at baseline but does have rather high functional baseline - imaging showed "Acute surgical neck proximal left humeral fracture with mild posteromedial angulation, anterior and lateral displacement of the distal fragment, and cephalad displacement with the proximal margin of the distal fragment having extended into the anterior deltoid muscle with swelling in the muscle and overlying ecchymosis." - s/p ORIF on 8/31 - outpt followup planned for 9/13  ABLA (acute blood loss anemia) - slow downtrend in Hgb since fall at home; has large bruising in left shoulder  likely accounting for some gradual loss as well as recent surgery - HR is actually improved some this morning but Hgb down to 7.3 g/dL - given age and having started around 11 g/dL on admission, it's appropriate to go ahead and transfuse 1 unit PRBC 11/08/21 - Hgb improved and stable at discharge, 9.7 g/dL  Ileostomy status ([place]) - ostomy in place; patient able to manage at home independently - likely in postop phase she will need assistance with this and SNF would be appropriate   Left hip pain - Seems over the trochanter.  No pain with ROM of the left hip.  No bruising to suggest hematoma. - xray obtained on 8/29 shows old healed B/L superior and inferior pubic rami fractures; diffuse decreased bone mineralization noted  - will start her on Ca + Vit D as well   Iron deficiency anemia - Hgb slightly down; likely from large bruising on left shoulder - baseline ~10 - 11 g/dL - stable and back to near baseline at discharge   Sinus tachycardia-resolved as of 11/10/2021 - patient has chronic tachycardia in the 90s per son - currently multifactorial for transient elevation at this time: anemia, pain, acute illness, deconditioning -Overall, heart rate has down trended and remained stable  Type 2 diabetes mellitus with complication, without long-term current use of insulin (HCC) A1c 7.8%. Glucose normal here - resume jardiance at discharge   Stage 3b chronic kidney disease (CKD) (HCC) Baseline Cr 1.2-1.6, stable  1st degree AV block - very mildly increased PR interval; old EKGs reviewed and  present as well - patient asymptomatic  - no further workup at this time - prior echo reviewed from March 2023 as well; preserved EF and mild LVH; no RWMA  Chronic back pain - Good relief with tramadol at home typically.  Home dose is typically 1 to 2 tablets 3 times a day as needed - Okay to resume tramadol at this time.  Discussed with pharmacy and regimen will be renally adjusted for now - If  further improvement in renal function in the future, should be able to readjust regimen at that time also  Hypomagnesemia - Supplemented as needed  Hyperlipidemia - no prior lipid panel noted - has been on crestor 10 mg outpatient as of recently - given age and risk/benefits, likely no further mortality benefit of continuing statin; also discussed with her son  - will go ahead and stop statin at this time  Essential hypertension, ruled out - confirmed with patient and son that she is not on any anti-hypertensives at this time - continue monitoring BP and should it uptrend, would likely start ACEi or ARB as initial therapy (CKD not a contraindication) - did not require medication    Principal Diagnosis: Left humeral fracture  Discharge Diagnoses: Active Hospital Problems   Diagnosis Date Noted   Left humeral fracture 11/03/2021    Priority: 1.   ABLA (acute blood loss anemia) 11/08/2021    Priority: 2.   Ileostomy status ([place]) 12/13/2017    Priority: 2.   Left hip pain 11/04/2021    Priority: 3.   Iron deficiency anemia 06/24/2021    Priority: 4.   Type 2 diabetes mellitus with complication, without long-term current use of insulin ([place]) 06/24/2021    Priority: 5.   Stage 3b chronic kidney disease (CKD) ([place]) 12/13/2017    Priority: 6.   1st degree AV block 11/05/2021    Priority: 7.   Chronic back pain 11/09/2021   Hypomagnesemia 11/03/2021   Essential hypertension, ruled out 06/24/2021   Hyperlipidemia 06/24/2021    Resolved Hospital Problems   Diagnosis Date Noted Date Resolved   Sinus tachycardia 12/13/2017 11/10/2021    Priority: 4.     Discharge Instructions     Diet general   Complete by: As directed    Increase activity slowly   Complete by: As directed    Leave dressing on - Keep it clean, dry, and intact until clinic visit   Complete by: As directed       Allergies as of 11/10/2021       Reactions   Codeine Itching, Nausea Only, Swelling    Flexeril [cyclobenzaprine]    Per Records from [place]         Medication List     STOP taking these medications    carbamide peroxide 6.5 % OTIC solution Commonly known as: DEBROX   D3 High Potency 50 MCG (2000 UT) Caps Generic drug: Cholecalciferol   hydrALAZINE 25 MG tablet Commonly known as: APRESOLINE   ondansetron 4 MG disintegrating tablet Commonly known as: ZOFRAN-ODT   psyllium 58.6 % packet Commonly known as: METAMUCIL   rosuvastatin 10 MG tablet Commonly known as: CRESTOR   sodium bicarbonate 650 MG tablet       TAKE these medications    acetaminophen 500 MG tablet Commonly known as: TYLENOL Take 2 tablets (1,000 mg total) by mouth in the morning, at noon, and at bedtime.   alendronate 70 MG tablet Commonly known as: FOSAMAX TAKE 1 TABLET BY MOUTH ONCE  A WEEK WITH A FULL GLASS OF WATER ON AN EMPTY STOMACH   ascorbic acid 500 MG tablet Commonly known as: VITAMIN C Take 1 tablet (500 mg total) by mouth daily. Needs an appointment before anymore future refills.   Aspirin Low Dose 81 MG tablet Generic drug: aspirin EC Take 81 mg by mouth daily.   calcium-vitamin D 500-5 MG-MCG tablet Commonly known as: OSCAL WITH D Take 1 tablet by mouth 2 (two) times daily.   diphenoxylate-atropine 2.5-0.025 MG tablet Commonly known as: LOMOTIL Take 1 tablet by mouth 4 (four) times daily as needed for diarrhea or loose stools. What changed: See the new instructions.   empagliflozin 25 MG Tabs tablet Commonly known as: Jardiance Take 1 tablet (25 mg total) by mouth daily before breakfast.   iron polysaccharides 150 MG capsule Commonly known as: Ferrex 150 Take one capsule by mouth once daily. Needs an appointment before anymore future refills. What changed:  how much to take how to take this when to take this   loperamide 2 MG capsule Commonly known as: IMODIUM Take 1 capsule (2 mg total) by mouth 3 (three) times daily as needed for diarrhea or  loose stools. TAKE 1 CAPSULE BY MOUTH EVERY MORNING, 1 CAPSULE AT LUNCH, AND 1 CAPSULE AT dinner What changed:  how much to take how to take this when to take this reasons to take this   traMADol 50 MG tablet Commonly known as: ULTRAM 1 tab in morning, 1 tab at lunchtime, and 2 tabs before bed What changed:  how much to take how to take this when to take this reasons to take this additional instructions   zolpidem 5 MG tablet Commonly known as: AMBIEN Take 1 tablet (5 mg total) by mouth at bedtime as needed for sleep.               Discharge Care Instructions  (From admission, onward)           Start     Ordered   11/10/21 0000  Leave dressing on - Keep it clean, dry, and intact until clinic visit        11/10/21 1030            Follow-up Information     Justice Britain, MD Follow up.   Specialty: Orthopedic Surgery Why: follow up with Dr Onnie Graham on 9/13 at 2pm follow up with Dr. Jeralyn Ruths Nurse Practitioner Benedetto Goad after 3:15 Contact information: [address] [place] [address] [phone]                Allergies  Allergen Reactions   Codeine Itching, Nausea Only and Swelling   Flexeril [Cyclobenzaprine]     Per Records from [place]     Consultations: Orthopedic surgery  Procedures: Open reduction and internal fixation of severely displaced left 2 part proximal humerus fracture, 11/06/21  Discharge Exam: BP (!) 170/74 (BP Location: Right Arm)   Pulse 71   Temp 98.2 F (36.8 C)   Resp 18   Ht '4\' 11"'$  (1.499 m)   Wt 58.3 kg   SpO2 97%   BMI 25.96 kg/m  Physical Exam Constitutional:      General: She is not in acute distress.    Appearance: Normal appearance. She is not ill-appearing.  HENT:     Head: Normocephalic and atraumatic.     Mouth/Throat:     Mouth: Mucous membranes are moist.  Eyes:     Extraocular Movements: Extraocular movements  intact.  Cardiovascular:     Rate and Rhythm:  Normal rate and regular rhythm.  Pulmonary:     Effort: Pulmonary effort is normal. No respiratory distress.     Breath sounds: Normal breath sounds.  Abdominal:     General: Bowel sounds are normal. There is no distension.     Palpations: Abdomen is soft.     Tenderness: There is no abdominal tenderness.     Comments: Ostomy bag in place with brown stool and air noted  Musculoskeletal:     Cervical back: Normal range of motion and neck supple.     Comments: LUE in sling; shoulder noted with bruising. Does have hand grip strength in left hand  Neurological:     Mental Status: She is alert.     Comments: Sensation intact in LUE  Psychiatric:        Mood and Affect: Mood normal.      The results of significant diagnostics from this hospitalization (including imaging, microbiology, ancillary and laboratory) are listed below for reference.   Microbiology: Recent Results (from the past 240 hour(s))  Surgical pcr screen     Status: None   Collection Time: 11/05/21 12:21 PM   Specimen: Nasal Mucosa; Nasal Swab  Result Value Ref Range Status   MRSA, PCR NEGATIVE NEGATIVE Final   Staphylococcus aureus NEGATIVE NEGATIVE Final    Comment: (NOTE) The Xpert SA Assay (FDA approved for NASAL specimens in patients 51 years of age and older), is one component of a comprehensive surveillance program. It is not intended to diagnose infection nor to guide or monitor treatment. Performed at [facility], [place] [address], [place], [place] [address]      Labs: BNP (last 3 results) No results for input(s): "BNP" in the last 8760 hours. Basic Metabolic Panel: Recent Labs  Lab 11/06/21 0334 11/06/21 2212 11/07/21 0334 11/08/21 0332 11/09/21 0346 11/10/21 0311  NA 136  --  137 141 136 137  K 4.8  --  4.5 4.6 4.1 4.6  CL 108  --  109 110 105 107  CO2 24  --  20* '25 25 25  '$ GLUCOSE 276* 464* 241* 145* 230* 145*  BUN 33*  --  33* 33* 32* 32*  CREATININE 1.38*  --  1.45*  1.33* 1.35* 1.23*  CALCIUM 8.9  --  9.0 9.2 8.8* 8.9  MG 1.8  --  1.7 1.5* 1.8 1.7   Liver Function Tests: No results for input(s): "AST", "ALT", "ALKPHOS", "BILITOT", "PROT", "ALBUMIN" in the last 168 hours. No results for input(s): "LIPASE", "AMYLASE" in the last 168 hours. No results for input(s): "AMMONIA" in the last 168 hours. CBC: Recent Labs  Lab 11/06/21 0334 11/07/21 0334 11/08/21 0332 11/08/21 2006 11/09/21 0346 11/10/21 0311  WBC 7.8 6.6 8.3  --  7.4 6.1  NEUTROABS 4.8 5.1 5.2  --  4.6 3.5  HGB 9.0* 8.5* 7.3* 9.0* 9.1* 9.7*  HCT 27.7* 26.7* 23.1* 28.0* 28.5* 30.0*  MCV 97.5 98.2 100.4*  --  96.6 97.1  PLT 181 185 168  --  187 176   Cardiac Enzymes: No results for input(s): "CKTOTAL", "CKMB", "CKMBINDEX", "TROPONINI" in the last 168 hours. BNP: Invalid input(s): "POCBNP" CBG: Recent Labs  Lab 11/09/21 0750 11/09/21 1306 11/09/21 1638 11/09/21 2207 11/10/21 0755  GLUCAP 162* 142* 143* 200* 142*   D-Dimer No results for input(s): "DDIMER" in the last 72 hours. Hgb A1c No results for input(s): "HGBA1C" in the last 72 hours.  Lipid Profile No results for input(s): "CHOL", "HDL", "LDLCALC", "TRIG", "CHOLHDL", "LDLDIRECT" in the last 72 hours. Thyroid function studies No results for input(s): "TSH", "T4TOTAL", "T3FREE", "THYROIDAB" in the last 72 hours.  Invalid input(s): "FREET3" Anemia work up No results for input(s): "VITAMINB12", "FOLATE", "FERRITIN", "TIBC", "IRON", "RETICCTPCT" in the last 72 hours. Urinalysis    Component Value Date/Time   COLORURINE YELLOW 06/24/2021 0939   APPEARANCEUR CLEAR 06/24/2021 0939   LABSPEC 1.016 06/24/2021 0939   PHURINE 5.0 06/24/2021 0939   GLUCOSEU >=500 (A) 06/24/2021 0939   HGBUR NEGATIVE 06/24/2021 0939   BILIRUBINUR NEGATIVE 06/24/2021 0939   KETONESUR NEGATIVE 06/24/2021 0939   PROTEINUR NEGATIVE 06/24/2021 0939   NITRITE NEGATIVE 06/24/2021 0939   LEUKOCYTESUR TRACE (A) 06/24/2021 0939   Sepsis  Labs Recent Labs  Lab 11/07/21 0334 11/08/21 0332 11/09/21 0346 11/10/21 0311  WBC 6.6 8.3 7.4 6.1   Microbiology Recent Results (from the past 240 hour(s))  Surgical pcr screen     Status: None   Collection Time: 11/05/21 12:21 PM   Specimen: Nasal Mucosa; Nasal Swab  Result Value Ref Range Status   MRSA, PCR NEGATIVE NEGATIVE Final   Staphylococcus aureus NEGATIVE NEGATIVE Final    Comment: (NOTE) The Xpert SA Assay (FDA approved for NASAL specimens in patients 56 years of age and older), is one component of a comprehensive surveillance program. It is not intended to diagnose infection nor to guide or monitor treatment. Performed at [facility], [place] [address] [place], [place] [address]     Procedures/Studies: DG Shoulder Left  Result Date: 11/06/2021 CLINICAL DATA:  Left humerus surgery. EXAM: LEFT SHOULDER - 2+ VIEW COMPARISON:  Left shoulder radiographs 11/02/2021 FINDINGS: Images were performed intraoperatively without the presence of a radiologist. Interval lateral plate and screw fixation of the previously seen oblique fracture of the humeral surgical neck. Improved, now anatomic alignment. No evidence of hardware failure. Total fluoroscopy images: 2 Total fluoroscopy time: 18 seconds Total dose: Radiation Exposure Index (as provided by the fluoroscopic device): 3.07 mGy air Kerma Please see intraoperative findings for further detail. IMPRESSION: Intraoperative fluoroscopy for proximal left femoral ORIF. Electronically Signed   By: Yvonne Kendall M.D.   On: 11/06/2021 17:04   DG C-Arm 1-60 Min-No Report  Result Date: 11/06/2021 Fluoroscopy was utilized by the requesting physician.  No radiographic interpretation.   DG C-Arm 1-60 Min-No Report  Result Date: 11/06/2021 Fluoroscopy was utilized by the requesting physician.  No radiographic interpretation.   DG HIP UNILAT WITH PELVIS 2-3 VIEWS LEFT  Result Date: 11/04/2021 CLINICAL DATA:  Fall 2 days  ago. No left shoulder fracture. Left hip pain. EXAM: DG HIP (WITH OR WITHOUT PELVIS) 2-3V LEFT COMPARISON:  KUB 06/26/2021, CT abdomen and pelvis 06/24/2021. FINDINGS: Diffuse decreased bone mineralization. Old healed fractures of the bilateral superior and inferior pubic rami. Moderate bilateral femoroacetabular joint space narrowing. The bilateral sacroiliac joint spaces are maintained. No acute fracture is seen. No dislocation. Moderate dextrocurvature of the mid lumbar spine. A vascular phlebolith overlies the left hemipelvis. IMPRESSION: Within the limitations of diffuse decreased bone mineralization, no acute fracture is seen. There are old healed bilateral superior and inferior pubic ramus fractures. Electronically Signed   By: Yvonne Kendall M.D.   On: 11/04/2021 18:03   CT Shoulder Left Wo Contrast  Result Date: 11/03/2021 CLINICAL DATA:  Fall with proximal left humeral fracture. EXAM: CT OF THE UPPER LEFT EXTREMITY WITHOUT CONTRAST TECHNIQUE: Multidetector CT imaging of the upper left  extremity was performed according to the standard protocol. RADIATION DOSE REDUCTION: This exam was performed according to the departmental dose-optimization program which includes automated exposure control, adjustment of the mA and/or kV according to patient size and/or use of iterative reconstruction technique. COMPARISON:  Right shoulder series earlier today. FINDINGS: Bones/Joint/Cartilage Generalized osteopenia. Again noted is an acute transverse surgical neck fracture of the proximal left humerus. The distal fragment is impacted and cephalad displaced up to 2 cm with respect to the proximal fragment, displaced anteriorly up to an entire shaft width, displaced laterally by about 1/2 of a shaft's width, with mild posteromedial angulation. There are small comminution fragments along side the fracture margins. The left clavicle, sternoclavicular and AC joints, and visualized portion of the scapula are intact. The  visualized left ribs do not show evidence of a displaced fracture. There is advanced degenerative change of the visualized cervical spine. Evaluation of the glenohumeral joint demonstrates labral chondrocalcinosis and mild periarticular spurring changes, but only slight joint space loss. There are periarticular spurs and calcified pannus involving the [facility] joint and sternoclavicular joint. There is a small glenohumeral joint effusion with scattered calcific debris in the joint space. Ligaments Suboptimally assessed by CT. Muscles and Tendons There are calcifications within the rotator cuff tendons, relatively mild narrowing is seen along the acromial humeral space and only mild atrophy in the supraspinatus muscle belly with other rotator cuff muscles showing normal bulk. No acute tendon rupture is seen. The proximal end of the distal fracture fragment protrudes up into the anterior aspect of the deltoid muscle with swelling and soft tissue fullness in the anterior left deltoid. There is no other focal muscular acute abnormality. Soft tissues There is moderate subcutaneous stranding in the anterior left shoulder, likely ecchymosis. No space-occupying hematoma is seen. There is atherosclerotic disease in the aortic arch. IMPRESSION: 1. Acute surgical neck proximal left humeral fracture with mild posteromedial angulation, anterior and lateral displacement of the distal fragment, and cephalad displacement with the proximal margin of the distal fragment having extended into the anterior deltoid muscle with swelling in the muscle and overlying ecchymosis. 2. Osteopenia, degenerative changes, and chondrocalcinosis, the latter consistent with CPPD. 3. Scattered calcific debris in the glenohumeral joint space, calcified pannus in the [facility] joint and sternoclavicular joint. 4. Rotator cuff tendon calcifications, could be related CPPD and/or calcific tendinopathy. 5. Aortic atherosclerosis. Electronically Signed   By: Telford Nab M.D.   On: 11/03/2021 00:25   CT Cervical Spine Wo Contrast  Result Date: 11/02/2021 CLINICAL DATA:  Status post fall. EXAM: CT CERVICAL SPINE WITHOUT CONTRAST TECHNIQUE: Multidetector CT imaging of the cervical spine was performed without intravenous contrast. Multiplanar CT image reconstructions were also generated. RADIATION DOSE REDUCTION: This exam was performed according to the departmental dose-optimization program which includes automated exposure control, adjustment of the mA and/or kV according to patient size and/or use of iterative reconstruction technique. COMPARISON:  None Available. FINDINGS: Alignment: There is approximately 3 mm anterolisthesis of the C3 vertebral body on C4. Reversal of the normal cervical spine lordosis is also noted. Skull base and vertebrae: No acute fracture. Chronic and degenerative changes are seen involving the body and tip of the dens, as well as the adjacent portion of the anterior arch of C1. Soft tissues and spinal canal: No prevertebral fluid or swelling. No visible canal hematoma. Disc levels: Marked severity endplate sclerosis is seen at the levels of C5-C6 and C6-C7, with moderate severity endplate sclerosis noted at C3-C4 and C4-C5. There  is marked severity narrowing of the anterior atlantoaxial articulation. Marked severity intervertebral disc space narrowing is seen at C3-C4, C4-C5, C5-C6 and C6-C7. Marked severity bilateral facet joint hypertrophy is seen at the level of C3-C4, with marked severity left-sided facet joint hypertrophy noted at C2-C3. Moderate severity bilateral facet joint hypertrophy is noted throughout the remainder of the cervical spine. Upper chest: Negative. Other: None. IMPRESSION: 1. No acute fracture or traumatic malalignment of the cervical spine. 2. Marked severity multilevel degenerative changes, as described above. 3. 3 mm anterolisthesis of the C3 vertebral body on C4. 4. Reversal of the normal cervical spine lordosis,  which may be secondary to positioning or muscle spasm. Electronically Signed   By: Virgina Norfolk M.D.   On: 11/02/2021 23:59   CT Head Wo Contrast  Result Date: 11/02/2021 CLINICAL DATA:  Status post fall. EXAM: CT HEAD WITHOUT CONTRAST TECHNIQUE: Contiguous axial images were obtained from the base of the skull through the vertex without intravenous contrast. RADIATION DOSE REDUCTION: This exam was performed according to the departmental dose-optimization program which includes automated exposure control, adjustment of the mA and/or kV according to patient size and/or use of iterative reconstruction technique. COMPARISON:  None Available. FINDINGS: Brain: There is moderate severity cerebral atrophy with widening of the extra-axial spaces and ventricular dilatation. There are areas of decreased attenuation within the white matter tracts of the supratentorial brain, consistent with microvascular disease changes. Vascular: No hyperdense vessel or unexpected calcification. Skull: Normal. Negative for fracture or focal lesion. Sinuses/Orbits: No acute finding. Other: None. IMPRESSION: 1. No acute intracranial abnormality. 2. Generalized cerebral atrophy and microvascular disease changes of the supratentorial brain. Electronically Signed   By: Virgina Norfolk M.D.   On: 11/02/2021 23:56   DG Shoulder Left  Result Date: 11/02/2021 CLINICAL DATA:  Fall. EXAM: LEFT SHOULDER - 2+ VIEW COMPARISON:  None Available. FINDINGS: There is an acute mildly comminuted fracture through the left humeral neck. There is 1 shaft with lateral displacement of the distal fracture fragment. There is apex lateral angulation. No dislocation. There are mild degenerative changes of the acromioclavicular joint. There are multiple healed left-sided rib fractures. IMPRESSION: 1. Acute displaced left humeral neck fracture. Electronically Signed   By: Ronney Asters M.D.   On: 11/02/2021 21:54     Time coordinating discharge: Over 30  minutes    Dwyane Dee, MD  [facility] 11/10/2021, 10:35 AM

## 2021-11-10 NOTE — Progress Notes (Signed)
   Subjective: 4 Days Post-Op Procedure(s) (LRB): OPEN REDUCTION INTERNAL FIXATION (ORIF) PROXIMAL HUMERUS FRACTURE (Left)  Pt c/o mild soreness in the shoulder and difficulty moving the arm Pt can move the hand/wrist without difficulty Denies any new symptoms or issues Patient reports pain as mild.  Objective:   VITALS:   Vitals:   11/09/21 2205 11/10/21 0520  BP: (!) 150/79 (!) 170/74  Pulse: 71 71  Resp: 18 18  Temp: 98.7 F (37.1 C) 98.2 F (36.8 C)  SpO2: 97% 97%    Left shoulder incision healing well Nv intact distally No rashes or edema distally Sling in good position  LABS Recent Labs    11/08/21 0332 11/08/21 2006 11/09/21 0346 11/10/21 0311  HGB 7.3* 9.0* 9.1* 9.7*  HCT 23.1* 28.0* 28.5* 30.0*  WBC 8.3  --  7.4 6.1  PLT 168  --  187 176    Recent Labs    11/08/21 0332 11/09/21 0346 11/10/21 0311  NA 141 136 137  K 4.6 4.1 4.6  BUN 33* 32* 32*  CREATININE 1.33* 1.35* 1.23*  GLUCOSE 145* 230* 145*     Assessment/Plan: 4 Days Post-Op Procedure(s) (LRB): OPEN REDUCTION INTERNAL FIXATION (ORIF) PROXIMAL HUMERUS FRACTURE (Left) Continue gentle PT/OT D/c planning Pain management  Will continue to monitor her progress   Kathrynn Speed, MPAS [facility] is now [employer] [facility] [address], [place], [place], [place] [address] Phone: [phone] [email or web address] Facebook  [employer]

## 2021-11-10 NOTE — TOC Transition Note (Signed)
Transition of Care [facility]) - CM/SW Discharge Note   Patient Details  Name: Shelah Heatley MRN: [id number] Date of Birth: 07-Aug-1923  Transition of [facility]) CM/SW Contact:  Ross Ludwig, LCSW Phone Number: 11/10/2021, 11:24 AM   Clinical Narrative:     Patient to be d/c'ed today to [facility] room 409.  Patient and family agreeable to plans will transport via ems RN to call report to [phone].  CSW spoke to patient's son who is aware that patient is discharging today.  CSW to sign off, please reconsult if social work needs arise.    Final next level of care: Skilled Nursing Facility Barriers to Discharge: Barriers Resolved   Patient Goals and CMS Choice Patient states their goals for this hospitalization and ongoing recovery are:: To go to SNF for short term rehab then return back home. CMS Medicare.gov Compare Post Acute Care list provided to:: Patient Represenative (must comment) Choice offered to / list presented to : Adult Children, Patient  Discharge Placement PASRR number recieved: 11/07/21            Patient chooses bed at: [facility] Patient to be transferred to facility by: [facility] Name of family member notified: Son Jude Patient and family notified of of transfer: 11/10/21  Discharge Plan and Services In-house Referral: Clinical Social Work   Post Acute Care Choice: [facility]          DME Arranged: N/A DME Agency: NA                  Social Determinants of Health ([place]) Interventions     Readmission Risk Interventions     No data to display

## 2021-11-12 ENCOUNTER — Encounter (HOSPITAL_COMMUNITY): Payer: Self-pay | Admitting: Orthopedic Surgery

## 2021-12-30 ENCOUNTER — Other Ambulatory Visit: Payer: Medicare Other | Admitting: Internal Medicine

## 2021-12-30 ENCOUNTER — Encounter: Payer: Self-pay | Admitting: Internal Medicine

## 2021-12-30 VITALS — BP 118/62 | HR 112 | Temp 97.7°F

## 2021-12-30 DIAGNOSIS — M545 Low back pain, unspecified: Secondary | ICD-10-CM

## 2021-12-30 DIAGNOSIS — M25552 Pain in left hip: Secondary | ICD-10-CM

## 2021-12-30 DIAGNOSIS — S42212S Unspecified displaced fracture of surgical neck of left humerus, sequela: Secondary | ICD-10-CM

## 2021-12-30 DIAGNOSIS — G8929 Other chronic pain: Secondary | ICD-10-CM

## 2021-12-30 DIAGNOSIS — N1831 Chronic kidney disease, stage 3a: Secondary | ICD-10-CM

## 2021-12-30 DIAGNOSIS — E1122 Type 2 diabetes mellitus with diabetic chronic kidney disease: Secondary | ICD-10-CM

## 2021-12-30 DIAGNOSIS — Z932 Ileostomy status: Secondary | ICD-10-CM

## 2021-12-30 DIAGNOSIS — I10 Essential (primary) hypertension: Secondary | ICD-10-CM

## 2021-12-30 NOTE — Progress Notes (Signed)
[name] Visit Telephone: [phone]  Fax: [phone]   Date of encounter: 12/30/21 8:41 AM PATIENT NAME: Cheryl Salas [address] [place] [address]   [phone] (home)  DOB: 1923-05-27 MRN: [id number] PRIMARY CARE PROVIDER:    Gayland Curry, DO,  [place] [address] [phone]  RESPONSIBLE PARTY:    Contact Information     Name Relation Home Work [place], [place] Son   [phone]   Mitschke,Lindy Daughter [phone]  [phone]        I met face to face with patient and family in her home.                                    ASSESSMENT AND PLAN / RECOMMENDATIONS:   Advance Care Planning/Goals of Care: Goals include to maximize quality of life and symptom management. Patient/health care surrogate gave his/her permission to discuss.Our advance care planning conversation included a discussion about:    The value and importance of advance care planning  Experiences with loved ones who have been seriously ill or have died  Exploration of personal, cultural or spiritual beliefs that might influence medical decisions  Exploration of goals of care in the event of a sudden injury or illness  Identification  of a healthcare agent--son, Jude Stohr Review and updating or creation of an  advance directive document . Decision not to resuscitate or to de-escalate disease focused treatments due to poor prognosis. CODE STATUS:  DNR, MOST is on file  Symptom Management/Plan: 1. Closed displaced fracture of surgical neck of left humerus, unspecified fracture morphology, sequela -s/p repair and rehab, doing well in this regard with more ROM than previously a she'd also had some rotator  cuff arthropathy -no pain here  2. Left hip pain -s/p fall at rehab -continue heating pad use, tylenol, but does best if she takes her tramadol--she does not want to take 1/1/2 as was done at  [facility] though it helped her so she agreed to take 1 tid   3. Chronic midline low back pain without sciatica -responds well to injections through Dr. Nelva Bush, continue tylenol and tramadol for pain b/w when injections wear off  4. Type 2 diabetes mellitus with stage 3a chronic kidney disease, with long-term current use of insulin (HCC) -has been well controlled -continue daily jardiance Lab Results  Component Value Date   HGBA1C 7.8 (H) 11/03/2021    5. Essential hypertension -off meds as she was found to be having hypotension during her hospitalization and rehab stays -bp today remains low, but she is back to some challenges with sinus tachycardia--seems pain related and anxiety today -her son will continue to check on her pulse to ensure it's not running up consistently--had been ok since she was home until this am  6. Ileostomy status ([place]) -not having any challenges with bowels lately--she is off imodium, only using lomotil now  Recommend f/u in 3-4 months with primary care.  This visit was coded based on medical decision making (MDM).  PPS: 50%  HOSPICE ELIGIBILITY/DIAGNOSIS: TBD  Chief Complaint: Follow-up palliative visit  HISTORY OF PRESENT ILLNESS:  Cheryl Salas is a 88 y.o. year old female  with DMII, CKD with anemia of chronic disease and iron deficiency, insomnia, osteoporosis, and recent admission and rehab stay s/p fall with humeral fracture for which she underwent surgery.  Did well with one tramadol morning noon and two at bedtime at [facility].  Did have a fall there and has had sore left hip/buttock area since.  Right side doing fine after her epidural from Dr. Nelva Bush.    She's a bit anxious and I note her memory is not quite as sharp as prior visits.    History obtained from review of EMR, discussion with primary team, and interview with family, facility staff/caregiver and/or Ms. Mcinnis.   I reviewed available labs, medications, imaging, studies and  related documents from the EMR.  Records reviewed and summarized above.   ROS Review of Systems  Constitutional:  Positive for fatigue. Negative for activity change, appetite change, chills, fever and unexpected weight change.  HENT:  Negative for congestion.   Eyes:  Negative for visual disturbance.  Respiratory:  Negative for chest tightness and shortness of breath.   Gastrointestinal:  Negative for constipation.       Ileostomy  Genitourinary:  Negative for dysuria.  Musculoskeletal:  Positive for arthralgias, back pain and gait problem.  Skin:  Negative for color change.  Neurological:  Negative for dizziness and light-headedness.  Hematological:  Bruises/bleeds easily.  Psychiatric/Behavioral:  Positive for sleep disturbance. The patient is nervous/anxious.     Physical Exam: There were no vitals filed for this visit. There is no height or weight on file to calculate BMI. Wt Readings from Last 500 Encounters:  11/02/21 128 lb 8.5 oz (58.3 kg)  05/13/20 128 lb 9.6 oz (58.3 kg)  11/27/19 117 lb 12.8 oz (53.4 kg)  06/05/19 123 lb (55.8 kg)  05/15/19 122 lb (55.3 kg)  01/09/19 120 lb 9.6 oz (54.7 kg)  09/26/18 116 lb (52.6 kg)  05/16/18 115 lb (52.2 kg)  05/06/18 116 lb (52.6 kg)  03/07/18 115 lb (52.2 kg)  12/13/17 116 lb (52.6 kg)   Physical Exam Vitals reviewed.  Constitutional:      Appearance: Normal appearance.  HENT:     Head: Normocephalic and atraumatic.  Cardiovascular:     Rate and Rhythm: Regular rhythm. Tachycardia present.     Heart sounds: No murmur heard. Pulmonary:     Effort: Pulmonary effort is normal.     Breath sounds: Normal breath sounds. No wheezing, rhonchi or rales.  Abdominal:     General: Bowel sounds are normal.     Comments: Large ventral hernia; ileostomy with soft brown stool  Musculoskeletal:        General: Normal range of motion.     Comments: Very minimal edema at sock lines  Skin:    General: Skin is warm and dry.   Neurological:     General: No focal deficit present.     Mental Status: She is alert and oriented to person, place, and time.     Gait: Gait abnormal.     Comments: Able to use rollator again now that out of sling for left arm--no tenderness of left arm/shoulder and ROM intact  Psychiatric:        Mood and Affect: Mood normal.        Behavior: Behavior normal.     CURRENT PROBLEM LIST:  Patient Active Problem List   Diagnosis Date Noted   Chronic back pain 11/09/2021   ABLA (acute blood loss anemia) 11/08/2021   1st degree AV block 11/05/2021   Left hip pain 11/04/2021   Left humeral fracture 11/03/2021   Hypomagnesemia 11/03/2021   SOB (shortness of breath) 06/25/2021   Type 2 diabetes  mellitus with complication, without long-term current use of insulin ([place]) 06/24/2021   Iron deficiency anemia 06/24/2021   Essential hypertension, ruled out 06/24/2021   Hyperlipidemia 06/24/2021   Pubic ramus fracture ([place]) 05/18/2018   Type 2 diabetes mellitus with stage 3 chronic kidney disease, without long-term current use of insulin ([place]) 12/13/2017   Bilateral hearing loss due to cerumen impaction 12/13/2017   Loose stools 12/13/2017   Ileostomy status ([place]) 12/13/2017   Diverticulosis 12/13/2017   Stage 3b chronic kidney disease (CKD) ([place]) 12/13/2017   Senile osteoporosis 12/13/2017   Absolute anemia 12/13/2017   Localized edema 12/13/2017   History of recurrent UTIs 12/13/2017   History of breast cancer 12/13/2017   History of bladder cancer 12/13/2017    PAST MEDICAL HISTORY:  Active Ambulatory Problems    Diagnosis Date Noted   Type 2 diabetes mellitus with stage 3 chronic kidney disease, without long-term current use of insulin ([place]) 12/13/2017   Bilateral hearing loss due to cerumen impaction 12/13/2017   Loose stools 12/13/2017   Ileostomy status ([place]) 12/13/2017   Diverticulosis 12/13/2017   Stage 3b chronic kidney disease (CKD) ([place]) 12/13/2017   Senile osteoporosis  12/13/2017   Absolute anemia 12/13/2017   Localized edema 12/13/2017   History of recurrent UTIs 12/13/2017   History of breast cancer 12/13/2017   History of bladder cancer 12/13/2017   Pubic ramus fracture ([place]) 05/18/2018   Type 2 diabetes mellitus with complication, without long-term current use of insulin ([place]) 06/24/2021   Iron deficiency anemia 06/24/2021   Essential hypertension, ruled out 06/24/2021   Hyperlipidemia 06/24/2021   SOB (shortness of breath) 06/25/2021   Left humeral fracture 11/03/2021   Hypomagnesemia 11/03/2021   Left hip pain 11/04/2021   1st degree AV block 11/05/2021   ABLA (acute blood loss anemia) 11/08/2021   Chronic back pain 11/09/2021   Resolved Ambulatory Problems    Diagnosis Date Noted   Sinus tachycardia 12/13/2017   Past Medical History:  Diagnosis Date   Allergic rhinitis    Anemia    Arthralgia of multiple sites    B12 deficiency    Baker's cyst    Bladder cancer (HCC)    CKD (chronic kidney disease) stage 3, GFR 30-59 ml/min (HCC)    Dehydration    Dextroscoliosis    Diabetes ([place])    Diverticulitis    Fecal occult blood test positive    Gout    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HCC)    Hypertension    Hypocalcemia    Iron deficiency    Large bowel obstruction (HCC)    Mixed dyslipidemia    Myopathy    Onychomycosis    Peripheral neuropathy    Recurrent UTI    Vitamin D deficiency     SOCIAL HX:  Social History   Tobacco Use   Smoking status: Never   Smokeless tobacco: Never  Substance Use Topics   Alcohol use: Never     ALLERGIES:  Allergies  Allergen Reactions   Codeine Itching, Nausea Only and Swelling   Flexeril [Cyclobenzaprine]     Per Records from [facility]:  Outpatient Encounter Medications as of 12/30/2021  Medication Sig   acetaminophen (TYLENOL) 500 MG tablet Take 2 tablets (1,000 mg total) by mouth in the morning, at noon, and at bedtime.   alendronate (FOSAMAX) 70 MG  tablet TAKE 1 TABLET BY MOUTH ONCE A WEEK WITH A FULL GLASS OF WATER ON AN EMPTY STOMACH  ASPIRIN LOW DOSE 81 MG EC tablet Take 81 mg by mouth daily.   calcium-vitamin D (OSCAL WITH D) 500-5 MG-MCG tablet Take 1 tablet by mouth 2 (two) times daily.   diphenoxylate-atropine (LOMOTIL) 2.5-0.025 MG tablet Take 1 tablet by mouth 4 (four) times daily as needed for diarrhea or loose stools.   empagliflozin (JARDIANCE) 25 MG TABS tablet Take 1 tablet (25 mg total) by mouth daily before breakfast.   iron polysaccharides (FERREX 150) 150 MG capsule Take one capsule by mouth once daily. Needs an appointment before anymore future refills. (Patient taking differently: Take 150 mg by mouth daily. Take one capsule by mouth once daily. Needs an appointment before anymore future refills.)   loperamide (IMODIUM) 2 MG capsule Take 1 capsule (2 mg total) by mouth 3 (three) times daily as needed for diarrhea or loose stools. TAKE 1 CAPSULE BY MOUTH EVERY MORNING, 1 CAPSULE AT LUNCH, AND 1 CAPSULE AT dinner   traMADol (ULTRAM) 50 MG tablet 1 tab in morning, 1 tab at lunchtime, and 2 tabs before bed   vitamin C (ASCORBIC ACID) 500 MG tablet Take 1 tablet (500 mg total) by mouth daily. Needs an appointment before anymore future refills.   zolpidem (AMBIEN) 5 MG tablet Take 1 tablet (5 mg total) by mouth at bedtime as needed for sleep.   No facility-administered encounter medications on file as of 12/30/2021.    Thank you for the opportunity to participate in the care of Ms. Hurlbutt.  The palliative care team will continue to follow. Please call our office at [phone] if we can be of additional assistance.   Hollace Kinnier, DO  COVID-19 PATIENT SCREENING TOOL Asked and negative response unless otherwise noted:  Have you had symptoms of covid, tested positive or been in contact with someone with symptoms/positive test in the past 5-10 days? no

## 2023-02-10 ENCOUNTER — Other Ambulatory Visit: Payer: Self-pay | Admitting: Internal Medicine

## 2023-02-10 ENCOUNTER — Other Ambulatory Visit (HOSPITAL_COMMUNITY): Payer: Self-pay | Admitting: *Deleted

## 2023-02-10 DIAGNOSIS — E860 Dehydration: Secondary | ICD-10-CM

## 2023-02-10 DIAGNOSIS — N179 Acute kidney failure, unspecified: Secondary | ICD-10-CM

## 2023-02-10 DIAGNOSIS — N1831 Chronic kidney disease, stage 3a: Secondary | ICD-10-CM

## 2023-02-11 ENCOUNTER — Ambulatory Visit (HOSPITAL_COMMUNITY)
Admission: RE | Admit: 2023-02-11 | Discharge: 2023-02-11 | Disposition: A | Discharge status: Home / Self Care | Payer: Medicare Other | Source: Ambulatory Visit | Attending: Internal Medicine | Admitting: Internal Medicine

## 2023-02-11 DIAGNOSIS — E860 Dehydration: Secondary | ICD-10-CM | POA: Insufficient documentation

## 2023-02-11 DIAGNOSIS — N179 Acute kidney failure, unspecified: Secondary | ICD-10-CM | POA: Diagnosis present

## 2023-02-11 MED ORDER — SODIUM CHLORIDE 0.9 % IV SOLN: Freq: Once | INTRAVENOUS | Status: AC

## 2023-02-12 ENCOUNTER — Ambulatory Visit
Admission: RE | Admit: 2023-02-12 | Discharge: 2023-02-12 | Disposition: A | Discharge status: Home / Self Care | Payer: Medicare Other | Source: Ambulatory Visit | Attending: Internal Medicine | Admitting: Internal Medicine

## 2023-02-12 DIAGNOSIS — N179 Acute kidney failure, unspecified: Secondary | ICD-10-CM

## 2023-02-12 DIAGNOSIS — E860 Dehydration: Secondary | ICD-10-CM

## 2023-02-12 DIAGNOSIS — N1831 Chronic kidney disease, stage 3a: Secondary | ICD-10-CM

## 2023-08-09 ENCOUNTER — Telehealth: Payer: Self-pay

## 2023-08-09 DIAGNOSIS — E118 Type 2 diabetes mellitus with unspecified complications: Secondary | ICD-10-CM

## 2023-08-10 ENCOUNTER — Telehealth: Payer: Self-pay

## 2023-08-10 NOTE — Progress Notes (Signed)
 Complex Care Management Note Care Guide Note  08/10/2023 Name: Cheryl Salas MRN: [id number] DOB: 1923/09/08   Complex Care Management Outreach Attempts: An unsuccessful telephone outreach was attempted today to offer the patient information about available complex care management services.  Follow Up Plan:  Additional outreach attempts will be made to offer the patient complex care management information and services.   Encounter Outcome:  No Answer  Creola Doheny [facility], [facility] Guide  Direct Dial: [phone]  Fax [phone]

## 2023-08-12 ENCOUNTER — Other Ambulatory Visit: Payer: Self-pay

## 2023-08-12 NOTE — Patient Instructions (Signed)
 Visit Information  Thank you for taking time to visit with me today. Please don't hesitate to contact me if I can be of assistance to you before our next scheduled appointment.  Our next appointment is by telephone on 09/09/2023 at 9am Please call the care guide team at [phone] if you need to cancel or reschedule your appointment.      Please call the Suicide and Crisis Lifeline: 988 call the [place]  National Suicide Prevention Lifeline: 907-033-1469 or TTY: (661)318-8160 TTY 228-886-4505) to talk to a trained counselor call 1-800-273-TALK (toll free, 24 hour hotline) go to [facility] [address], [place] [phone]) call 911 if you are experiencing a Mental Health or Behavioral Health Crisis or need someone to talk to.  Patient verbalizes understanding of instructions and care plan provided today and agrees to view in MyChart. Active MyChart status and patient understanding of how to access instructions and care plan via MyChart confirmed with patient.     Haven Lion, BSW [place]  [facility] Social Worker, [facility] [phone]

## 2023-08-12 NOTE — Patient Outreach (Addendum)
 Complex Care Management   Visit Note  08/12/2023  Name:  Elmarie Devlin MRN: [id number] DOB: 16-Jul-1923  Situation: Referral received for Complex Care Management related to Assisted living, and Meals on Wheels. I obtained verbal consent from Caregiver.  Visit completed with caregiver  on the phone  Background:   Past Medical History:  Diagnosis Date   Allergic rhinitis    Per Records from Dr.Altheimer    Anemia    Per Records from Dr.Altheimer    Arthralgia of multiple sites    Per Records from Dr.Altheimer    B12 deficiency    Per Records from Dr.Altheimer    Baker's cyst    Per Records from Dr.Altheimer    Bladder cancer [facility])    CKD (chronic kidney disease) stage 3, GFR 30-59 ml/min (HCC)    Per Records from Dr.Altheimer    Dehydration    Per Records from Dr.Altheimer    Dextroscoliosis    Per Records from Dr.Altheimer    Diabetes [facility])    Diverticulitis    Per Records from Dr.Altheimer    Diverticulosis    Fecal occult blood test positive    Per Records from Dr.Altheimer    Gout    left foot   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HCC)    Per Records from Dr.Altheimer    Hypertension    Per Records from Dr.Altheimer    Hypocalcemia    Per Records from Dr.Altheimer    Hypomagnesemia    Per Records from Dr.Altheimer    Iron  deficiency    Per Records from Dr.Altheimer    Large bowel obstruction [facility])    Per Records from Dr.Altheimer    Mixed dyslipidemia    Per Records from Dr.Altheimer    Myopathy    Per Records from Dr.Altheimer    Onychomycosis    Per Records from Dr.Altheimer    Peripheral neuropathy    Per Records from Dr.Altheimer    Recurrent UTI    Vitamin D  deficiency    Per Records from Dr.Altheimer     Assessment: BSW outreached patient to assess SDOH barriers and did not identify any at this time. BSW spoke with patients caregiver and they discussed enrollment to meals on wheels, and possible options for assisted living. Resources have been sent to patient.       Recommendation:   No recommendations at this time.  Follow Up Plan:   Telephone follow-up on 09/09/2023 at 9am  Haven Lion, BSW [place]  [facility] Social Worker, [facility] [phone]

## 2023-08-24 ENCOUNTER — Other Ambulatory Visit: Payer: Self-pay

## 2023-08-24 NOTE — Patient Outreach (Signed)
 Complex Care Management   Visit Note  08/24/2023  Name:  Cheryl Salas MRN: [id number] DOB: Jul 21, 1923  Situation: Referral received for Complex Care Management related to information regarding LTC placement/Medicaid application I obtained verbal consent from Dr. Smalls (son/dpr).  Visit completed with Dr. Lacombe on the phone  Background:   Past Medical History:  Diagnosis Date   Allergic rhinitis    Per Records from Dr.Altheimer    Anemia    Per Records from Dr.Altheimer    Arthralgia of multiple sites    Per Records from Dr.Altheimer    B12 deficiency    Per Records from Dr.Altheimer    Baker's cyst    Per Records from Dr.Altheimer    Bladder cancer [facility])    CKD (chronic kidney disease) stage 3, GFR 30-59 ml/min (HCC)    Per Records from Dr.Altheimer    Dehydration    Per Records from Dr.Altheimer    Dextroscoliosis    Per Records from Dr.Altheimer    Diabetes [facility])    Diverticulitis    Per Records from Dr.Altheimer    Diverticulosis    Fecal occult blood test positive    Per Records from Dr.Altheimer    Gout    left foot   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HCC)    Per Records from Dr.Altheimer    Hypertension    Per Records from Dr.Altheimer    Hypocalcemia    Per Records from Dr.Altheimer    Hypomagnesemia    Per Records from Dr.Altheimer    Iron  deficiency    Per Records from Dr.Altheimer    Large bowel obstruction [facility])    Per Records from Dr.Altheimer    Mixed dyslipidemia    Per Records from Dr.Altheimer    Myopathy    Per Records from Dr.Altheimer    Onychomycosis    Per Records from Dr.Altheimer    Peripheral neuropathy    Per Records from Dr.Altheimer    Recurrent UTI    Vitamin D  deficiency    Per Records from Dr.Altheimer    Assessment: Dr. Timmins reports patient is doing well. Reports patient lives alone in apartment behind son's home. Reports patient is able to prepare her own meals. And denies any questions or concerns regarding patient's health at  this time. He reports her diabetes is well controlled-denies hyperglycemic or hypoglycemic events. He reports  BP well controlled, Patient has ileostomy that she takes care of without difficulty and manages medications with difficulty. No nursing Care management needs noted at this time. Dr. Dimond agrees no nursing care management needs at this time. Son states he is being proactive and is requesting information on process long term care placement and Medicaid application.   Patient Reported Symptoms: Cognitive Cognitive Status:  (Per son, patient is alert, oriented)   Health Maintenance Behaviors: Healthy diet, Hobbies  Neurological Neurological Review of Symptoms: No symptoms reported    HEENT HEENT Symptoms Reported: No symptoms reported      Cardiovascular Cardiovascular Symptoms Reported: No symptoms reported    Respiratory Respiratory Symptoms Reported: No symptoms reported    Endocrine Patient reports the following symptoms related to hypoglycemia or hyperglycemia : No symptoms reported Is patient diabetic?: Yes Is patient checking blood sugars at home?: Yes Endocrine Conditions: Diabetes  Gastrointestinal   Gastrointestinal Management Strategies: Ileostomy Gastrointestinal Self-Management Outcome: 5 (very good)    Genitourinary Genitourinary Symptoms Reported: No symptoms reported    Integumentary Integumentary Symptoms Reported: No symptoms reported    Musculoskeletal Musculoskelatal Symptoms Reviewed: Muscle  pain Additional Musculoskeletal Details: uses rollator walker Musculoskeletal Conditions: Back pain (per son, pain is managed, patient able to perform dailly actvities.) Musculoskeletal Management Strategies: Medication therapy, Routine screening Musculoskeletal Self-Management Outcome: 4 (good)      Psychosocial Psychosocial Symptoms Reported: No symptoms reported     Quality of Family Relationships: supportive      05/15/2019    8:32 AM  Depression screen PHQ  2/9  Decreased Interest 0  Down, Depressed, Hopeless 0  PHQ - 2 Score 0    There were no vitals filed for this visit.  Medications Reviewed Today     Reviewed by Shaun Zuccaro M, RN (Registered Nurse) on 08/24/23 at 1622  Med List Status: <None>   Medication Order Taking? Sig Documenting Provider Last Dose Status Informant  acetaminophen  (TYLENOL ) 500 MG tablet [id number] Yes Take 2 tablets (1,000 mg total) by mouth in the morning, at noon, and at bedtime. Faith Homes, MD  Active   alendronate  (FOSAMAX ) 70 MG tablet [id number]  TAKE 1 TABLET BY MOUTH ONCE A WEEK WITH A FULL GLASS OF WATER ON AN EMPTY STOMACH  Patient not taking: Reported on 08/24/2023   Sarrah Cure, DO  Active Child, Pharmacy Records, Multiple Informants           Med Note Heinz Llano Nov 02, 2021 10:51 PM) sundays  ASPIRIN  LOW DOSE 81 MG EC tablet [id number] Yes Take 81 mg by mouth daily. [provider]  Active Child, Pharmacy Records, Multiple Informants  calcium -vitamin D  (OSCAL WITH D) 500-5 MG-MCG tablet [id number] Yes Take 1 tablet by mouth 2 (two) times daily. Faith Homes, MD  Active   diphenoxylate -atropine  (LOMOTIL ) 2.5-0.025 MG tablet [id number] Yes Take 1 tablet by mouth 4 (four) times daily as needed for diarrhea or loose stools. Faith Homes, MD  Active   empagliflozin  (JARDIANCE ) 25 MG TABS tablet [id number]  Take 1 tablet (25 mg total) by mouth daily before breakfast.  Patient not taking: Reported on 08/24/2023   Sarrah Cure, DO  Active Child, Pharmacy Records, Multiple Informants  insulin  glargine (LANTUS) 100 unit/mL SOPN [id number] Yes Inject 14 Units into the skin daily. [provider]  Active   iron  polysaccharides (FERREX 150) 150 MG capsule [id number] Yes Take one capsule by mouth once daily. Needs an appointment before anymore future refills. Doreen Gamma, MD  Active Child, Pharmacy Records, Multiple Informants  traMADol  (ULTRAM ) 50 MG tablet [id number]  Yes 1 tab in morning, 1 tab at lunchtime, and 2 tabs before bed Girguis, David, MD  Active   vitamin C  (ASCORBIC ACID ) 500 MG tablet [id number] Yes Take 1 tablet (500 mg total) by mouth daily. Needs an appointment before anymore future refills. Doreen Gamma, MD  Active Child, Pharmacy Records, Multiple Informants  zolpidem  (AMBIEN ) 5 MG tablet [id number]  Take 1 tablet (5 mg total) by mouth at bedtime as needed for sleep.  Patient not taking: Reported on 08/24/2023   Faith Homes, MD  Active           Recommendation:   Continue to follow up with BSW for resources  Follow Up Plan:   BSW will continue to follow to assist with Medicaid application and resource information related to LTC placement  Lindi Revering, RN, MSN, BSN, CCM [place]  [facility], Population Health Case Manager Phone: [phone]

## 2023-08-25 NOTE — Patient Instructions (Addendum)
 Visit Information  Thank you for taking time to visit with me today. Please don't hesitate to contact me if I can be of assistance to you before our next scheduled appointment.  Your next appointment is with Haven Lion by telephone on 09/09/23 at 9:00 am Please call the care guide team at [phone] if you need to cancel or reschedule your appointment.   Following is a copy of your care plan:   Goals Addressed             This Visit's Progress    VBCI RN Care Plan       Problems:  Care Coordination needs related to proactive long term care planning for patient. Son would like resources regarding process for transitioning to higher level of care and Medicaid application process.  Goal: Over the next 60 days the Caregiver will receive information of process for Medicaid application and higher level of care process as evidence by caregiver/patient report or review of chart  Interventions:  Completed assessment for complex disease management needs. Confirmed with Son that no nursing care management needs at this time. Confirmed that BSW is actively working with patent's family to provide information related to LTC and Medicaid eligibility process.   Patient Self-Care Activities:  Work with clinical social worker and/or care coordination team  Plan:  BSW scheduled to outreach to son on 09/09/23            Please call the Suicide and Crisis Lifeline: 988 call the [place]  National Suicide Prevention Lifeline: (307)305-1693 or TTY: 501-588-6504 TTY 940-497-6336) to talk to a trained counselor if you are experiencing a Mental Health or Behavioral Health Crisis or need someone to talk to.  Patient verbalizes understanding of instructions and care plan provided today and agrees to view in MyChart. Active MyChart status and patient understanding of how to access instructions and care plan via MyChart confirmed with patient.     Lindi Revering, RN, MSN, BSN, CCM    [facility], Population Health Case Manager Phone: [phone]

## 2023-09-07 IMAGING — MR MR CERVICAL SPINE W/O CM
4 of 5 series · 19 of 48 positions shown · non-contrast
Comparison: None.

CLINICAL DATA: Cervical radiculopathy

EXAM:
MRI CERVICAL SPINE WITHOUT CONTRAST
TECHNIQUE: Multiplanar, multisequence MR imaging of the cervical spine was
performed. No intravenous contrast was administered.

[Series 2: T2 · sagittal · 3.0mm · 0.39mm/px · 6 of 15 slices shown (1 of 2)]
[im 1/15]
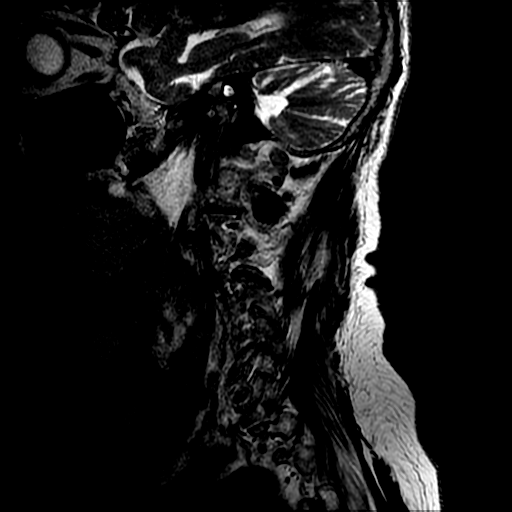
[im 3/15]
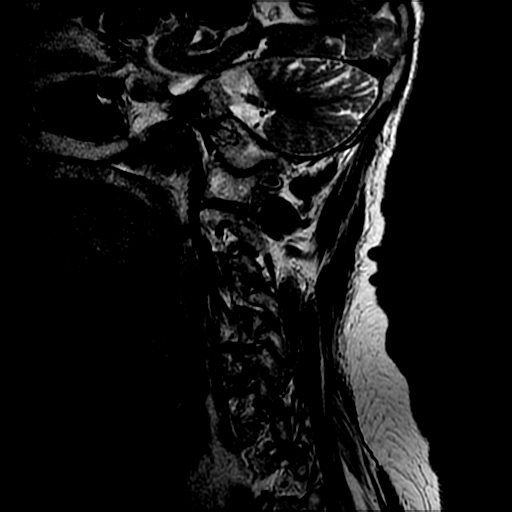
[im 6/15]
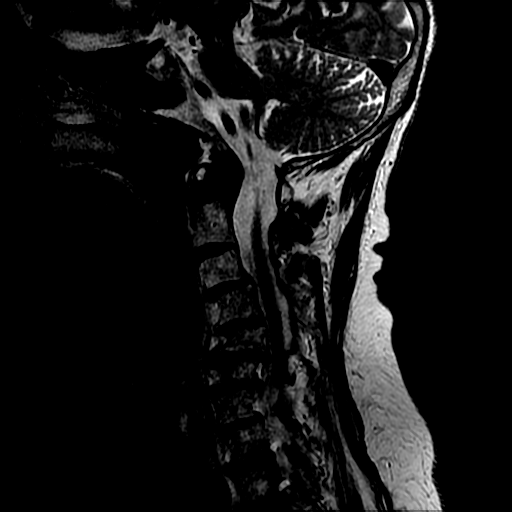
[im 9/15]
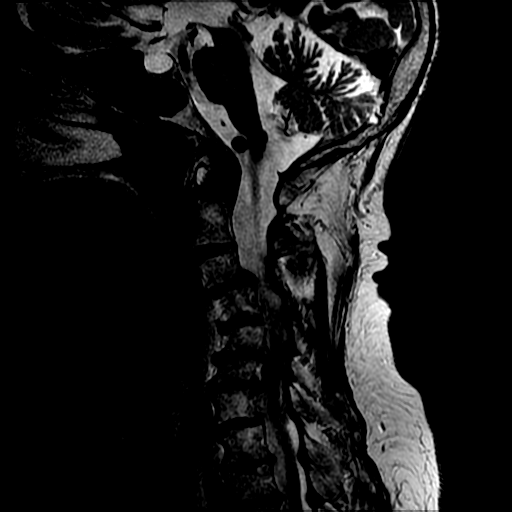
[im 12/15]
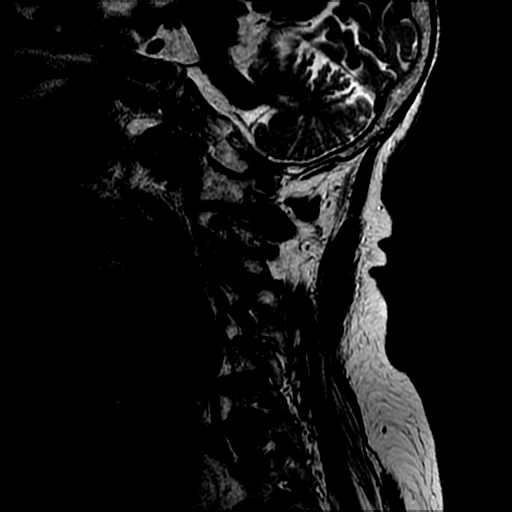
[im 15/15]
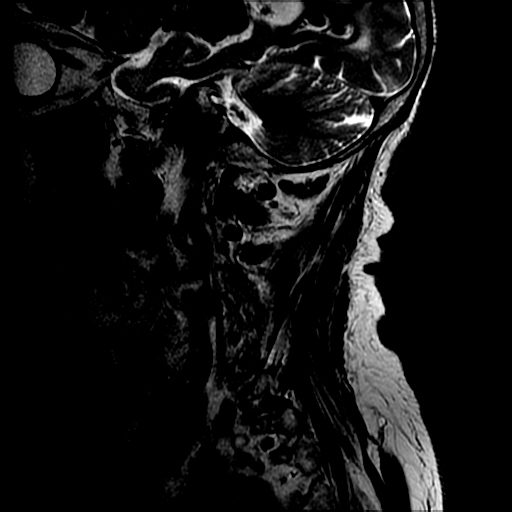

[Series 3: T1 · sagittal · 3.0mm · 0.39mm/px · 3 of 15 slices shown]
[im 3/15]
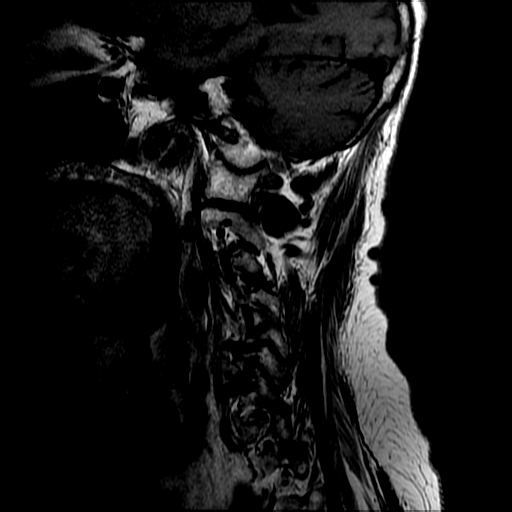
[im 8/15]
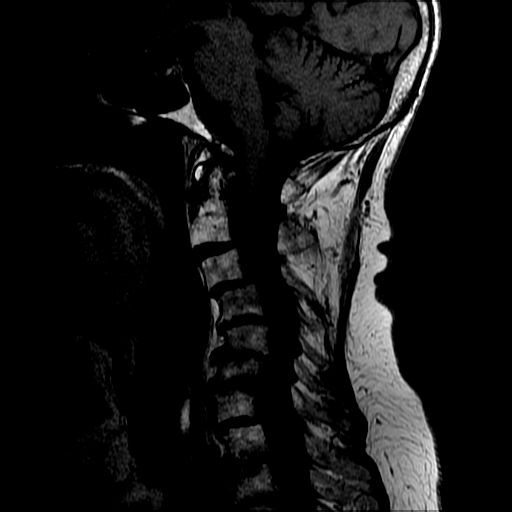
[im 12/15]
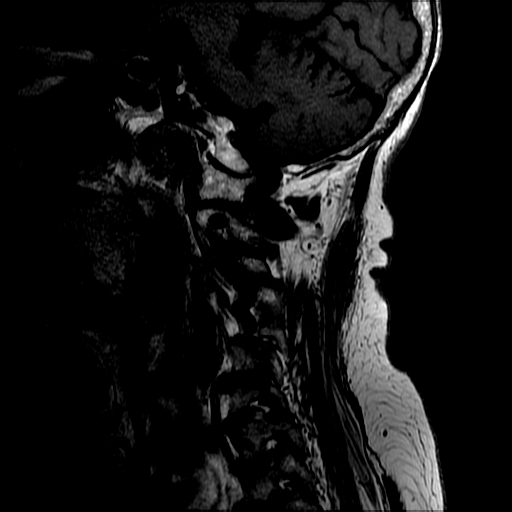

[Series 4: sag ir · sagittal · 3.0mm · 0.39mm/px · 3 of 15 slices shown]
[im 3/15]
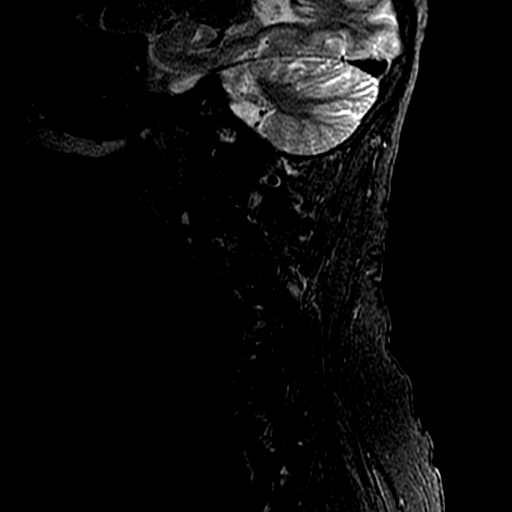
[im 8/15]
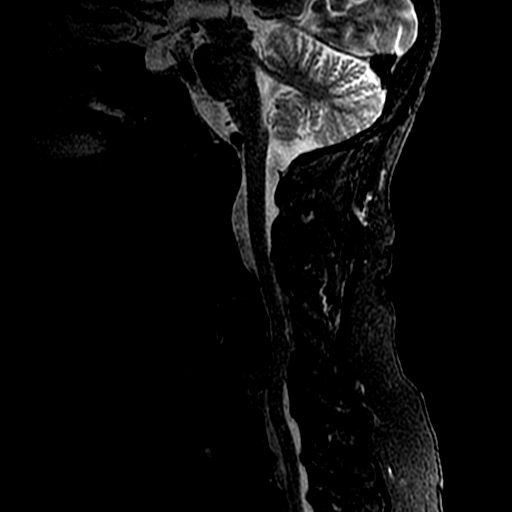
[im 12/15]
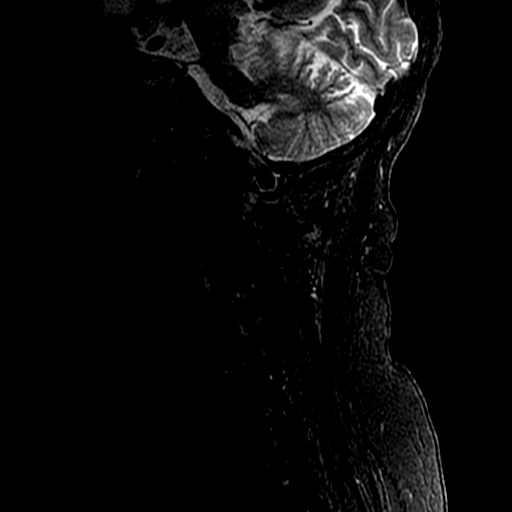

[Series 6: T2 · axial · 3.0mm · 0.39mm/px · z∈[-40,+45]mm · 7 of 32 slices shown (2 of 2)]
[im 1/32]
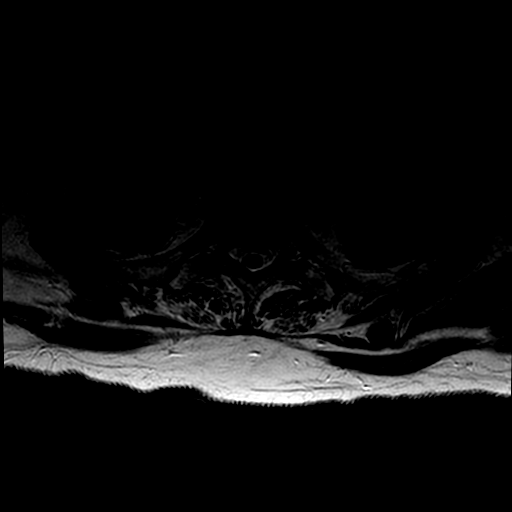
[im 5/32]
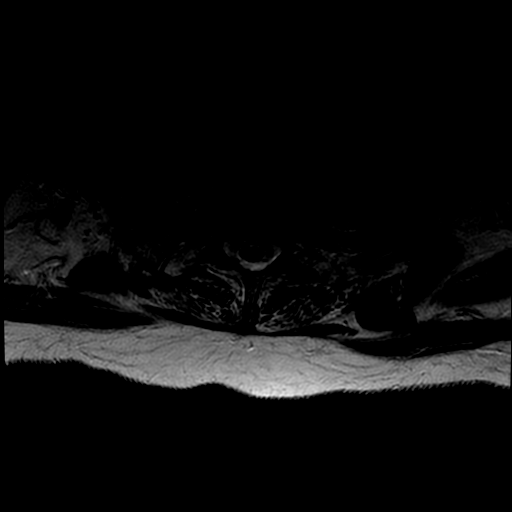
[im 10/32]
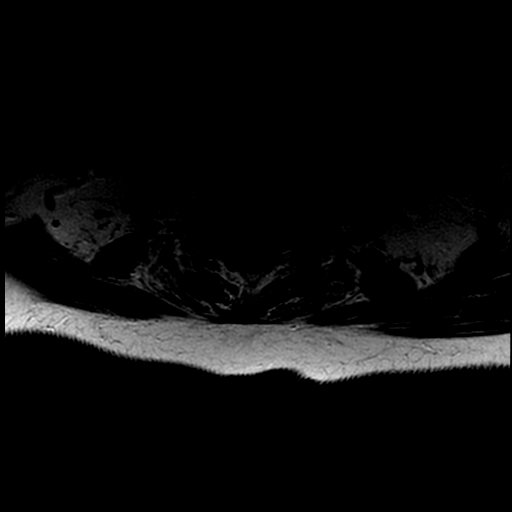
[im 15/32]
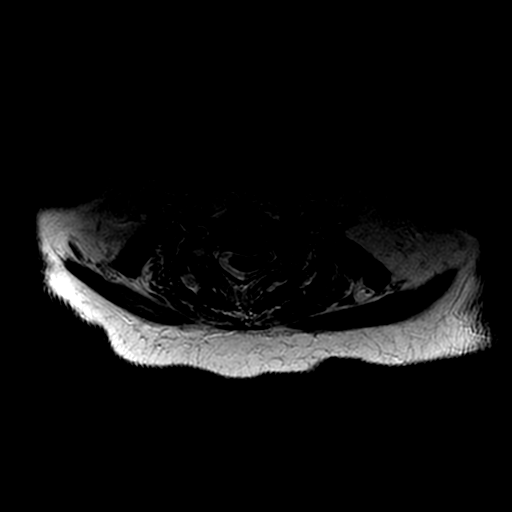
[im 17/32]
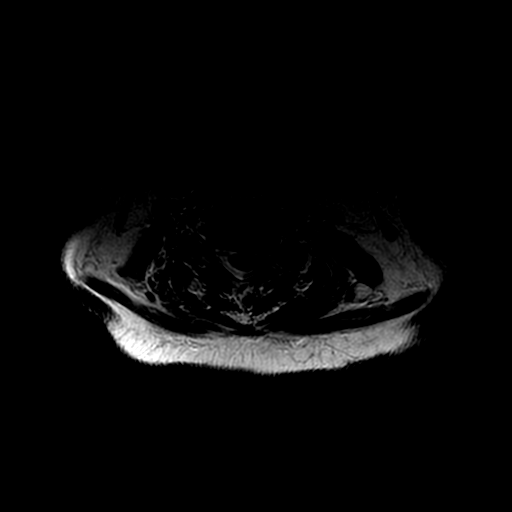
[im 22/32]
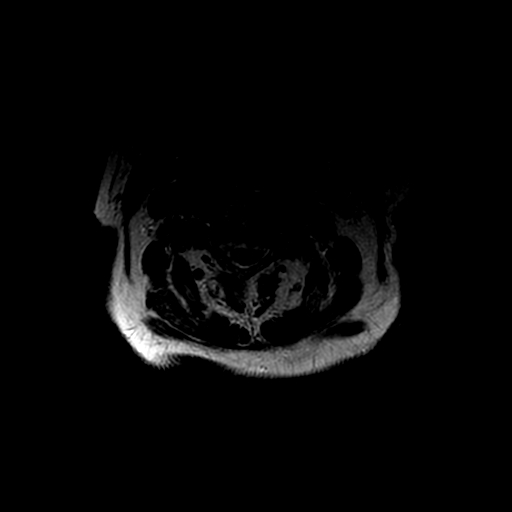
[im 27/32]
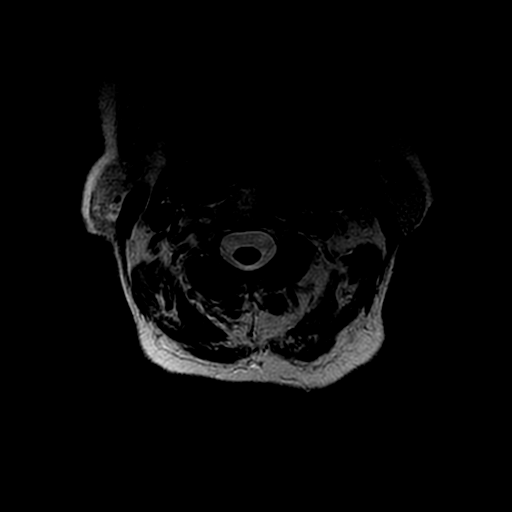

[19 of 48 positions shown; findings below may reference images not displayed]

FINDINGS: Alignment: 2 mm anterolisthesis of C3 on C4. 2 mm retrolisthesis of
C5 on C6 and C6 on C7. 3 mm anterolisthesis of C7 on T1.

Vertebrae: No acute fracture, evidence of discitis, or bone lesion.

Cord: Normal signal and morphology.

Posterior Fossa, vertebral arteries, paraspinal tissues: Posterior
fossa demonstrates no focal abnormality. Vertebral artery flow voids
are maintained. Paraspinal soft tissues are unremarkable.

Disc levels:

Discs: Degenerative disease with disc height loss at C3-4, C4-5,
C5-6 and C6-7. Degenerative disease with disc height loss at T1-2
and T2-3.

C2-3: No significant disc bulge. Severe left facet arthropathy.
Moderate left foraminal stenosis. No right foraminal stenosis. No
spinal stenosis.

C3-4: Broad-based disc bulge. Mild bilateral facet arthropathy.
Severe bilateral foraminal stenosis. Mild-moderate spinal stenosis.

C4-5: No significant disc bulge. Bilateral uncovertebral
degenerative changes. Moderate-severe bilateral foraminal stenosis.
No spinal stenosis.

C5-6: Broad-based disc bulge. Bilateral uncovertebral degenerative
changes. Severe bilateral foraminal stenosis. Moderate spinal
stenosis.

C6-7: Broad-based disc bulge. Bilateral uncovertebral degenerative
changes. Severe bilateral foraminal stenosis. Moderate spinal
stenosis.

C7-T1: Broad-based disc bulge. Mild bilateral facet arthropathy.
Mild-moderate bilateral foraminal stenosis.

T1-2: Broad-based disc bulge. Mild bilateral facet arthropathy.
Moderate right and mild left foraminal stenosis.
IMPRESSION: 1. Diffuse cervical spine spondylosis as described above.
2. No acute osseous injury of the cervical spine.

## 2023-09-09 ENCOUNTER — Other Ambulatory Visit: Payer: Self-pay

## 2023-10-28 ENCOUNTER — Ambulatory Visit: Attending: Cardiovascular Disease

## 2023-10-28 ENCOUNTER — Ambulatory Visit: Attending: Cardiovascular Disease | Admitting: Cardiovascular Disease

## 2023-10-28 ENCOUNTER — Encounter: Payer: Self-pay | Admitting: Cardiovascular Disease

## 2023-10-28 VITALS — BP 116/76 | HR 111 | Ht <= 58 in | Wt 120.4 lb

## 2023-10-28 DIAGNOSIS — I440 Atrioventricular block, first degree: Secondary | ICD-10-CM

## 2023-10-28 DIAGNOSIS — R0602 Shortness of breath: Secondary | ICD-10-CM | POA: Diagnosis present

## 2023-10-28 DIAGNOSIS — R002 Palpitations: Secondary | ICD-10-CM | POA: Diagnosis present

## 2023-10-28 NOTE — Patient Instructions (Signed)
 Medication Instructions:  No medication changes were made at this visit. Continue current regimen.   *If you need a refill on your cardiac medications before your next appointment, please call your pharmacy*  Lab Work: None ordered today. If you have labs (blood work) drawn today and your tests are completely normal, you will receive your results only by: MyChart Message (if you have MyChart) OR A paper copy in the mail If you have any lab test that is abnormal or we need to change your treatment, we will call you to review the results.  Testing/Procedures: Your physician has requested that you have an echocardiogram at the next available appointment slot. Echocardiography is a painless test that uses sound waves to create images of your heart. It provides your doctor with information about the size and shape of your heart and how well your heart's chambers and valves are working. This procedure takes approximately one hour. There are no restrictions for this procedure. Please do NOT wear cologne, perfume, aftershave, or lotions (deodorant is allowed). Please arrive 15 minutes prior to your appointment time.  Please note: We ask at that you not bring children with you during ultrasound (echo/ vascular) testing. Due to room size and safety concerns, children are not allowed in the ultrasound rooms during exams. Our front office staff cannot provide observation of children in our lobby area while testing is being conducted. An adult accompanying a patient to their appointment will only be allowed in the ultrasound room at the discretion of the ultrasound technician under special circumstances. We apologize for any inconvenience.   Your physician has requested that you wear a Zio heart monitor for 14 days. This will be mailed to your home with instructions on how to apply the monitor and how to return it when finished. Please allow 2 weeks after returning the heart monitor before our office calls you  with the results.   Follow-Up: At [facility], you and your health needs are our priority.  As part of our continuing mission to provide you with exceptional heart care, our providers are all part of one team.  This team includes your primary Cardiologist (physician) and Advanced Practice Providers or APPs (Physician Assistants and Nurse Practitioners) who all work together to provide you with the care you need, when you need it.  Your next appointment:   1 year(s)  Provider:   Ozell Fell, MD    We recommend signing up for the patient portal called MyChart.  Sign up information is provided on this After Visit Summary.  MyChart is used to connect with patients for Virtual Visits (Telemedicine).  Patients are able to view lab/test results, encounter notes, upcoming appointments, etc.  Non-urgent messages can be sent to your provider as well.   To learn more about what you can do with MyChart, go to [email or web address].   Other Instructions ZIO XT- Long Term Monitor Instructions  Your physician has requested you wear a ZIO patch monitor for 14 days.   This is a single patch monitor. [employer] supplies one patch monitor per enrollment. Additional  stickers are not available. Please do not apply patch if you will be having a Nuclear Stress Test,  Echocardiogram, Cardiac CT, MRI, or Chest Xray during the period you would be wearing the  monitor. The patch cannot be worn during these tests. You cannot remove and re-apply the  ZIO XT patch monitor.   Your ZIO patch monitor will be mailed 3 day USPS to your address  on file. It may take 3-5 days  to receive your monitor after you have been enrolled.   Once you have received your monitor, please review the enclosed instructions. Your monitor  has already been registered assigning a specific monitor serial # to you.     Billing and Patient Assistance Program Information  We have supplied [employer] with any of your insurance  information on file for billing purposes.  [employer] offers a sliding scale Patient Assistance Program for patients that do not have  insurance, or whose insurance does not completely cover the cost of the ZIO monitor.  You must apply for the Patient Assistance Program to qualify for this discounted rate.   To apply, please call [employer] at [phone], select option 4, select option 2, ask to apply for  Patient Assistance Program. Meredeth will ask your household income, and how many people  are in your household. They will quote your out-of-pocket cost based on that information.  [employer] will also be able to set up a 21-month, interest-free payment plan if needed.     Applying the monitor  Shave hair from upper left chest.  Hold abrader disc by orange tab. Rub abrader in 40 strokes over the upper left chest as  indicated in your monitor instructions.  Clean area with 4 enclosed alcohol pads. Let dry.  Apply patch as indicated in monitor instructions. Patch will be placed under collarbone on left  side of chest with arrow pointing upward.  Rub patch adhesive wings for 2 minutes. Remove white label marked 1. Remove the white  label marked 2. Rub patch adhesive wings for 2 additional minutes.  While looking in a mirror, press and release button in center of patch. A small green light will  flash 3-4 times. This will be your only indicator that the monitor has been turned on.  Do not shower for the first 24 hours. You may shower after the first 24 hours.  Press the button if you feel a symptom. You will hear a small click. Record Date, Time and  Symptom in the Patient Logbook.  When you are ready to remove the patch, follow instructions on the last 2 pages of Patient  Logbook. Stick patch monitor onto the last page of Patient Logbook.   Place Patient Logbook in the blue and white box. Use locking tab on box and tape box closed  securely. The blue and white box has prepaid postage on it.  Please place it in the mailbox as  soon as possible. Your physician should have your test results approximately 7 days after the  monitor has been mailed back to [facility].   Call [facility] Customer Care at [phone] if you have questions regarding  your ZIO XT patch monitor. Call them immediately if you see an orange light blinking on your  monitor.   If your monitor falls off in less than 4 days, contact our Monitor department at [phone].   If your monitor becomes loose or falls off after 4 days call [employer] at [phone] for  suggestions on securing your monitor.

## 2023-10-28 NOTE — Progress Notes (Signed)
 Cardiology Office Note:    Date:  10/28/2023   ID:  Cheryl Salas, DOB 1923/06/10, MRN [id number]  PCP:  Cheryl Rush, MD   Cheryl Salas Cardiologist:  Cheryl Fell, MD     Referring MD: Cheryl Rush, MD   Chief Complaint  Patient presents with   Shortness of Breath    History of Present Illness:    Cheryl Salas is a 89 y.o. female who is in remarkably good health, presents for evaluation of shortness of breath.  She is here with her son today.  The patient has had abrupt onset of episodic shortness of breath that usually occurs at rest.  She has not been able to relate this to any specific aggravating factors.  She is able to do her household activities without exertional dyspnea.  She denies chest pain or pressure.  She has had some heart palpitations over time but does not necessarily feel the palpitations in conjunction with her shortness of breath.  She does complain of orthopnea but no PND.  She has had no leg swelling.  She denies lightheadedness or syncope.  She has no history of hypertension.  An echocardiogram from 2023 showed LVEF of 65 to 70%, normal RV function, no evidence of mitral regurgitation, normal aortic valve function, and no significant abnormalities.  Significant medical history includes an ileostomy from surgery to treat diverticulitis at age 32.  She also had repair of a humerus fracture from mechanical fall in 2023.  The patient remains functionally independent and she still cooks for her family a few days per week.   Current Medications: Current Meds  Medication Sig   acetaminophen  (TYLENOL ) 500 MG tablet Take 2 tablets (1,000 mg total) by mouth in the morning, at noon, and at bedtime.   ASPIRIN  LOW DOSE 81 MG EC tablet Take 81 mg by mouth daily.   calcium -vitamin D  (OSCAL WITH D) 500-5 MG-MCG tablet Take 1 tablet by mouth 2 (two) times daily.   diphenoxylate -atropine  (LOMOTIL ) 2.5-0.025 MG tablet Take 1 tablet by mouth 4 (four) times daily  as needed for diarrhea or loose stools.   insulin  glargine (LANTUS) 100 unit/mL SOPN Inject 14 Units into the skin daily.   iron  polysaccharides (FERREX 150) 150 MG capsule Take one capsule by mouth once daily. Needs an appointment before anymore future refills.   traMADol  (ULTRAM ) 50 MG tablet 1 tab in morning, 1 tab at lunchtime, and 2 tabs before bed   vitamin C  (ASCORBIC ACID ) 500 MG tablet Take 1 tablet (500 mg total) by mouth daily. Needs an appointment before anymore future refills.    Past Medical History:  Diagnosis Date   Allergic rhinitis    Per Records from Dr.Altheimer    Anemia    Per Records from Dr.Altheimer    Arthralgia of multiple sites    Per Records from Dr.Altheimer    B12 deficiency    Per Records from Dr.Altheimer    Baker's cyst    Per Records from Dr.Altheimer    Bladder cancer [facility])    CKD (chronic kidney disease) stage 3, GFR 30-59 ml/min (HCC)    Per Records from Dr.Altheimer    Dehydration    Per Records from Dr.Altheimer    Dextroscoliosis    Per Records from Dr.Altheimer    Diabetes [facility])    Diverticulitis    Per Records from Dr.Altheimer    Diverticulosis    Fecal occult blood test positive    Per Records from Dr.Altheimer  Gout    left foot   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HCC)    Per Records from Dr.Altheimer    Hypertension    Per Records from Dr.Altheimer    Hypocalcemia    Per Records from Dr.Altheimer    Hypomagnesemia    Per Records from Dr.Altheimer    Iron  deficiency    Per Records from Dr.Altheimer    Large bowel obstruction [facility])    Per Records from Dr.Altheimer    Mixed dyslipidemia    Per Records from Dr.Altheimer    Myopathy    Per Records from Dr.Altheimer    Onychomycosis    Per Records from Dr.Altheimer    Peripheral neuropathy    Per Records from Dr.Altheimer    Recurrent UTI    Vitamin D  deficiency    Per Records from Dr.Altheimer     Past Surgical History:  Procedure Laterality Date   BLADDER SUSPENSION     Per  Records from Dr.Altheimer    CATARACT EXTRACTION, BILATERAL     CHOLECYSTECTOMY  1978   hysterectomy  1966   ILEOSTOMY  2017   KYPHOPLASTY  2019   MASTECTOMY Right 1998   MASTECTOMY Left 2011   ORIF HUMERUS FRACTURE Left 11/06/2021   Procedure: OPEN REDUCTION INTERNAL FIXATION (ORIF) PROXIMAL HUMERUS FRACTURE;  Surgeon: Melita Drivers, MD;  Location: [facility];  Service: Orthopedics;  Laterality: Left;    TRANSURETHRAL RESECTION OF BLADDER TUMOR      Allergies:   Codeine and Flexeril [cyclobenzaprine]   ROS:   Please see the history of present illness.    All other systems reviewed and are negative.  EKGs/Labs/Other Studies Reviewed:    The following studies were reviewed today: Cardiac Studies & Procedures   ______________________________________________________________________________________________     ECHOCARDIOGRAM  ECHOCARDIOGRAM COMPLETE 06/03/2021  Narrative ECHOCARDIOGRAM REPORT    Patient Name:   Cheryl Salas  Date of Exam: 06/03/2021 Medical Rec #:  [id number]     Height:       59.0 in Accession #:    [id number]    Weight:       128.6 lb Date of Birth:  03/05/1925    BSA:          1.529 m Patient Age:    89 years      BP:           120/70 mmHg Patient Gender: F             HR:           108 bpm. Exam Location:  [address]  Procedure: 2D Echo, Cardiac Doppler and Color Doppler  Indications:    R06.00 Dyspnea  History:        Patient has no prior history of Echocardiogram examinations. Risk Factors:Hypertension, Diabetes and Dyslipidemia.  Sonographer:    NaTashia Rodgers-Jones RDCS Referring Phys: [id number] TIFFANY L REED  IMPRESSIONS   1. Left ventricular ejection fraction, by estimation, is 65 to 70%. The left ventricle has normal function. The left ventricle has no regional wall motion abnormalities. There is mild left ventricular hypertrophy. Indeterminate diastolic filling due to E-A fusion. 2. Right ventricular systolic function is normal. The  right ventricular size is normal. There is normal pulmonary artery systolic pressure. 3. No evidence of mitral valve regurgitation. 4. The aortic valve is grossly normal. Aortic valve regurgitation is not visualized. 5. The inferior vena cava is normal in size with greater than 50% respiratory variability, suggesting right atrial pressure of 3 mmHg.  Comparison(s): No prior Echocardiogram.  FINDINGS Left Ventricle: Left ventricular ejection fraction, by estimation, is 65 to 70%. The left ventricle has normal function. The left ventricle has no regional wall motion abnormalities. The left ventricular internal cavity size was normal in size. There is mild left ventricular hypertrophy. Indeterminate diastolic filling due to E-A fusion.  Right Ventricle: The right ventricular size is normal. No increase in right ventricular wall thickness. Right ventricular systolic function is normal. There is normal pulmonary artery systolic pressure. The tricuspid regurgitant velocity is 2.61 m/s, and with an assumed right atrial pressure of 3 mmHg, the estimated right ventricular systolic pressure is 30.2 mmHg.  Left Atrium: Left atrial size was normal in size.  Right Atrium: Right atrial size was normal in size.  Pericardium: There is no evidence of pericardial effusion.  Mitral Valve: No evidence of mitral valve regurgitation.  Tricuspid Valve: Tricuspid valve regurgitation is mild.  Aortic Valve: The aortic valve is grossly normal. Aortic valve regurgitation is not visualized.  Pulmonic Valve: Pulmonic valve regurgitation is not visualized.  Aorta: The aortic root and ascending aorta are structurally normal, with no evidence of dilitation.  Venous: The inferior vena cava is normal in size with greater than 50% respiratory variability, suggesting right atrial pressure of 3 mmHg.  IAS/Shunts: No atrial level shunt detected by color flow Doppler.   LEFT VENTRICLE PLAX 2D LVIDd:         3.50 cm    Diastology LVIDs:         2.20 cm   LV e' medial:    11.30 cm/s LV PW:         1.10 cm   LV E/e' medial:  6.9 LV IVS:        1.10 cm   LV e' lateral:   5.98 cm/s LVOT diam:     1.80 cm   LV E/e' lateral: 13.0 LV SV:         28 LV SV Index:   19 LVOT Area:     2.54 cm   RIGHT VENTRICLE             IVC RV Basal diam:  3.00 cm     IVC diam: 1.60 cm RV S prime:     12.10 cm/s TAPSE (M-mode): 1.8 cm  LEFT ATRIUM             Index        RIGHT ATRIUM          Index LA diam:        4.40 cm 2.88 cm/m   RA Area:     9.89 cm LA Vol (A2C):   19.4 ml 12.69 ml/m  RA Volume:   22.20 ml 14.52 ml/m LA Vol (A4C):   34.8 ml 22.76 ml/m LA Biplane Vol: 26.4 ml 17.27 ml/m AORTIC VALVE LVOT Vmax:   60.70 cm/s LVOT Vmean:  41.350 cm/s LVOT VTI:    0.112 m  AORTA Ao Root diam: 3.10 cm Ao Asc diam:  3.10 cm  MITRAL VALVE               TRICUSPID VALVE MV Area (PHT): 5.13 cm    TR Peak grad:   27.2 mmHg MV Decel Time: 148 msec    TR Vmax:        261.00 cm/s MV E velocity: 77.60 cm/s MV A velocity: 74.90 cm/s  SHUNTS MV E/A ratio:  1.04        Systemic VTI:  0.11 m Systemic Diam: 1.80 cm  Ronal Ross Electronically signed by Ronal Ross Signature Date/Time: 06/03/2021/5:30:39 PM    Final          ______________________________________________________________________________________________      EKG:   EKG Interpretation Date/Time:  Thursday October 28 2023 10:15:34 EDT Ventricular Rate:  111 PR Interval:  190 QRS Duration:  74 QT Interval:  332 QTC Calculation: 451 R Axis:   -23  Text Interpretation: Sinus tachycardia Anteroseptal infarct , age undetermined When compared with ECG of 05-Nov-2021 10:29, Anteroseptal infarct is now Present Nonspecific T wave abnormality no longer evident in Inferior leads Confirmed by Wonda Sharper [phone]) on 10/28/2023 10:21:48 AM    Recent Labs: No results found for requested labs within last 365 days.  Recent Lipid Panel No results  found for: CHOL, TRIG, HDL, CHOLHDL, VLDL, LDLCALC, LDLDIRECT   Risk Assessment/Calculations:                Physical Exam:    VS:  BP 116/76 (BP Location: Left Arm, Patient Position: Sitting)   Pulse (!) 111   Ht 4' 10 (1.473 m)   Wt 120 lb 6.4 oz (54.6 kg)   SpO2 98%   BMI 25.16 kg/m     Wt Readings from Last 3 Encounters:  10/28/23 120 lb 6.4 oz (54.6 kg)  02/11/23 110 lb (49.9 kg)  11/02/21 128 lb 8.5 oz (58.3 kg)     GEN: Delightful, elderly woman in no acute distress HEENT: Normal NECK: No JVD; No carotid bruits LYMPHATICS: No lymphadenopathy CARDIAC: RRR, soft 1/6 systolic murmur at the apex of the heart RESPIRATORY:  Clear to auscultation without rales, wheezing or rhonchi  ABDOMEN: Soft, non-tender, non-distended MUSCULOSKELETAL:  No edema; No deformity  SKIN: Warm and dry NEUROLOGIC:  Alert and oriented x 3 PSYCHIATRIC:  Normal affect   Assessment & Plan Shortness of breath The patient describes abrupt onset shortness of breath that occurs at rest.  She does not appear to have a major component of exertional dyspnea.  Her cardiopulmonary exam is essentially normal.  I think it is appropriate to evaluate her for cardiac dysfunction with an echocardiogram and an event monitor to evaluate for arrhythmia which could be the most likely cause of her abrupt symptoms.  When EMS was called to her home, a rhythm strip was recorded and I have been able to review this today.  She was in normal sinus rhythm with frequent PVCs and short supraventricular runs were noted.  At her age, atrial arrhythmias would be very common in it is appropriate to screen for these with a 2-week ZIO monitor.  Depending on the findings of her monitor, might consider trying her on a low-dose of a beta-blocker or calcium  channel blocker if her symptoms persist and she is found to have some supraventricular arrhythmias. Palpitations Check echo and 2-week ZIO monitor.             Medication Adjustments/Labs and Tests Ordered: Current medicines are reviewed at length with the patient today.  Concerns regarding medicines are outlined above.  Orders Placed This Encounter  Procedures   LONG TERM MONITOR (3-14 DAYS)   EKG 12-Lead   ECHOCARDIOGRAM COMPLETE   No orders of the defined types were placed in this encounter.   Patient Instructions  Medication Instructions:  No medication changes were made at this visit. Continue current regimen.   *If you need a refill on your cardiac medications before your next appointment, please call your pharmacy*  Lab Work: None ordered today. If you have labs (blood work) drawn today and your tests are completely normal, you will receive your results only by: MyChart Message (if you have MyChart) OR A paper copy in the mail If you have any lab test that is abnormal or we need to change your treatment, we will call you to review the results.  Testing/Procedures: Your physician has requested that you have an echocardiogram at the next available appointment slot. Echocardiography is a painless test that uses sound waves to create images of your heart. It provides your doctor with information about the size and shape of your heart and how well your heart's chambers and valves are working. This procedure takes approximately one hour. There are no restrictions for this procedure. Please do NOT wear cologne, perfume, aftershave, or lotions (deodorant is allowed). Please arrive 15 minutes prior to your appointment time.  Please note: We ask at that you not bring children with you during ultrasound (echo/ vascular) testing. Due to room size and safety concerns, children are not allowed in the ultrasound rooms during exams. Our front office staff cannot provide observation of children in our lobby area while testing is being conducted. An adult accompanying a patient to their appointment will only be allowed in the ultrasound room at the  discretion of the ultrasound technician under special circumstances. We apologize for any inconvenience.   Your physician has requested that you wear a Zio heart monitor for 14 days. This will be mailed to your home with instructions on how to apply the monitor and how to return it when finished. Please allow 2 weeks after returning the heart monitor before our office calls you with the results.   Follow-Up: At [facility], you and your health needs are our priority.  As part of our continuing mission to provide you with exceptional heart care, our Salas are all part of one team.  This team includes your primary Cardiologist (physician) and Advanced Practice Salas or APPs (Physician Assistants and Nurse Practitioners) who all work together to provide you with the care you need, when you need it.  Your next appointment:   1 year(s)  Provider:   Ozell Fell, MD    We recommend signing up for the patient portal called MyChart.  Sign up information is provided on this After Visit Summary.  MyChart is used to connect with patients for Virtual Visits (Telemedicine).  Patients are able to view lab/test results, encounter notes, upcoming appointments, etc.  Non-urgent messages can be sent to your provider as well.   To learn more about what you can do with MyChart, go to [email or web address].   Other Instructions ZIO XT- Long Term Monitor Instructions  Your physician has requested you wear a ZIO patch monitor for 14 days.   This is a single patch monitor. [employer] supplies one patch monitor per enrollment. Additional  stickers are not available. Please do not apply patch if you will be having a Nuclear Stress Test,  Echocardiogram, Cardiac CT, MRI, or Chest Xray during the period you would be wearing the  monitor. The patch cannot be worn during these tests. You cannot remove and re-apply the  ZIO XT patch monitor.   Your ZIO patch monitor will be mailed 3 day USPS to your  address on file. It may take 3-5 days  to receive your monitor after you have been enrolled.   Once you have received your monitor, please review the enclosed instructions. Your monitor  has already been registered assigning a specific monitor serial # to you.     Billing and Patient Assistance Program Information  We have supplied [employer] with any of your insurance information on file for billing purposes.  [employer] offers a sliding scale Patient Assistance Program for patients that do not have  insurance, or whose insurance does not completely cover the cost of the ZIO monitor.  You must apply for the Patient Assistance Program to qualify for this discounted rate.   To apply, please call [employer] at [phone], select option 4, select option 2, ask to apply for  Patient Assistance Program. Meredeth will ask your household income, and how many people  are in your household. They will quote your out-of-pocket cost based on that information.  [employer] will also be able to set up a 26-month, interest-free payment plan if needed.     Applying the monitor  Shave hair from upper left chest.  Hold abrader disc by orange tab. Rub abrader in 40 strokes over the upper left chest as  indicated in your monitor instructions.  Clean area with 4 enclosed alcohol pads. Let dry.  Apply patch as indicated in monitor instructions. Patch will be placed under collarbone on left  side of chest with arrow pointing upward.  Rub patch adhesive wings for 2 minutes. Remove white label marked 1. Remove the white  label marked 2. Rub patch adhesive wings for 2 additional minutes.  While looking in a mirror, press and release button in center of patch. A small green light will  flash 3-4 times. This will be your only indicator that the monitor has been turned on.  Do not shower for the first 24 hours. You may shower after the first 24 hours.  Press the button if you feel a symptom. You will hear a small click.  Record Date, Time and  Symptom in the Patient Logbook.  When you are ready to remove the patch, follow instructions on the last 2 pages of Patient  Logbook. Stick patch monitor onto the last page of Patient Logbook.   Place Patient Logbook in the blue and white box. Use locking tab on box and tape box closed  securely. The blue and white box has prepaid postage on it. Please place it in the mailbox as  soon as possible. Your physician should have your test results approximately 7 days after the  monitor has been mailed back to [facility].   Call [facility] Customer Care at [phone] if you have questions regarding  your ZIO XT patch monitor. Call them immediately if you see an orange light blinking on your  monitor.   If your monitor falls off in less than 4 days, contact our Monitor department at [phone].   If your monitor becomes loose or falls off after 4 days call [employer] at [phone] for  suggestions on securing your monitor.       Signed, Cheryl Fell, MD  10/28/2023 11:30 AM    [facility]

## 2023-10-28 NOTE — Assessment & Plan Note (Signed)
 The patient describes abrupt onset shortness of breath that occurs at rest.  She does not appear to have a major component of exertional dyspnea.  Her cardiopulmonary exam is essentially normal.  I think it is appropriate to evaluate her for cardiac dysfunction with an echocardiogram and an event monitor to evaluate for arrhythmia which could be the most likely cause of her abrupt symptoms.  When EMS was called to her home, a rhythm strip was recorded and I have been able to review this today.  She was in normal sinus rhythm with frequent PVCs and short supraventricular runs were noted.  At her age, atrial arrhythmias would be very common in it is appropriate to screen for these with a 2-week ZIO monitor.  Depending on the findings of her monitor, might consider trying her on a low-dose of a beta-blocker or calcium  channel blocker if her symptoms persist and she is found to have some supraventricular arrhythmias.

## 2023-10-28 NOTE — Progress Notes (Unsigned)
 Enrolled for [employer] to mail a ZIO XT long term holter monitor to the patients address on file.

## 2023-11-09 NOTE — Patient Outreach (Signed)
 Complex Care Management   Visit Note  11/09/2023  Name:  Cheryl Salas MRN: [id number] DOB: 1923/05/23 No patient contact was made during this encounter.  Collaboration Encounter: RNCM spoke with BSW regarding patient resource needs. BSW to outreach to patient's son to see if any additional resources needed.   Follow Up Plan:   RNCM will coordinate/collaborate with BSW as needed. RNCM will close out of case once resource/information needs completed.   Heddy Shutter, RN, MSN, BSN, CCM [place]  [facility], Population Health Case Manager Phone: [phone]

## 2023-11-24 ENCOUNTER — Telehealth: Payer: Self-pay

## 2023-11-25 ENCOUNTER — Ambulatory Visit (HOSPITAL_COMMUNITY)
Admission: RE | Admit: 2023-11-25 | Discharge: 2023-11-25 | Disposition: A | Discharge status: Home / Self Care | Source: Ambulatory Visit | Attending: Internal Medicine | Admitting: Internal Medicine

## 2023-11-25 DIAGNOSIS — R0602 Shortness of breath: Secondary | ICD-10-CM | POA: Insufficient documentation

## 2023-11-25 LAB — ECHOCARDIOGRAM COMPLETE
Area-P 1/2: 4.49 cm2
Calc EF: 43.9 %
P 1/2 time: 515 ms
S' Lateral: 3 cm
Single Plane A2C EF: 41.4 %
Single Plane A4C EF: 45.5 %

## 2023-11-29 ENCOUNTER — Ambulatory Visit: Payer: Self-pay | Admitting: Cardiovascular Disease

## 2023-11-29 DIAGNOSIS — I440 Atrioventricular block, first degree: Secondary | ICD-10-CM

## 2023-11-29 DIAGNOSIS — R002 Palpitations: Secondary | ICD-10-CM

## 2023-12-01 NOTE — Progress Notes (Signed)
 Spoke with son, Dr Jude Macmullen. Pt doing well with no recurrent sx's. At 89 years old, we will treat her conservatively with no further ischemia workup or treatment. Her BP runs in the low normal and risk of GDMT likely outweighs potential benefit at her very advanced age. A beta blocker would be reasonable if she has recurrent symptoms and they will call us  if problems arise. thx

## 2023-12-06 ENCOUNTER — Other Ambulatory Visit: Payer: Self-pay

## 2023-12-06 NOTE — Patient Instructions (Signed)
 Zennie Polasek - I have attempted to call you three times but have been unsuccessful in reaching you. I work with Onita Rush, MD and am calling to support your healthcare needs. If I can be of assistance to you, please contact me at [phone].     Thank you,  Orlean Fey, BSW [place]  [facility] Social Worker, [employer] [phone]

## 2024-03-16 NOTE — Patient Instructions (Signed)
 Visit Information  Thank you for taking time to visit with me today. Please don't hesitate to contact me if I can be of assistance to you.  Patient has met all care management goals. Care Management case will be closed. Patient has been provided contact information should new needs arise.    Please call the Suicide and Crisis Lifeline: 988 call the [place] National Suicide Prevention Lifeline: 810-849-7905 or TTY: [phone] TTY (719)749-0080) to talk to a trained counselor if you are experiencing a Mental Health or Behavioral Health Crisis or need someone to talk to.  Kathyrn Sheriff, RN, MSN, BSN, CCM [place]  [facility], Population Health Case Manager Phone: [phone]

## 2024-03-16 NOTE — Patient Outreach (Signed)
 Complex Care Management   Visit Note  03/16/2024  Name:  Cheryl Salas MRN: [id number] DOB: 1924/02/23  Situation: RNCM called to follow up to see if any additonal resource or care management needs. Per son, patient is fine and denies any needs at this time.    Follow Up Plan:   Patient has met all care management goals. Care Management case will be closed. Patient has been provided contact information should new needs arise.   Heddy Shutter, RN, MSN, BSN, CCM [place]  [facility], Population Health Case Manager Phone: [phone]
